# Patient Record
Sex: Female | Born: 1945
Health system: Southern US, Community
[De-identification: ages and names within clinical notes are randomized; demographics above are authoritative.]

## PROBLEM LIST (undated history)

## (undated) VITALS — BP 139/72 | HR 84 | Temp 98.3°F | Resp 20 | Ht 61.0 in | Wt 122.0 lb

## (undated) DIAGNOSIS — F319 Bipolar disorder, unspecified: Secondary | ICD-10-CM

## (undated) DIAGNOSIS — F329 Major depressive disorder, single episode, unspecified: Secondary | ICD-10-CM

## (undated) DIAGNOSIS — E890 Postprocedural hypothyroidism: Secondary | ICD-10-CM

## (undated) DIAGNOSIS — H269 Unspecified cataract: Secondary | ICD-10-CM

## (undated) DIAGNOSIS — Z860100 Personal history of colon polyps, unspecified: Secondary | ICD-10-CM

## (undated) DIAGNOSIS — E119 Type 2 diabetes mellitus without complications: Secondary | ICD-10-CM

## (undated) DIAGNOSIS — R350 Frequency of micturition: Secondary | ICD-10-CM

## (undated) DIAGNOSIS — Z87820 Personal history of traumatic brain injury: Secondary | ICD-10-CM

## (undated) DIAGNOSIS — R6883 Chills (without fever): Secondary | ICD-10-CM

## (undated) DIAGNOSIS — M810 Age-related osteoporosis without current pathological fracture: Secondary | ICD-10-CM

## (undated) DIAGNOSIS — E079 Disorder of thyroid, unspecified: Secondary | ICD-10-CM

## (undated) DIAGNOSIS — Z8639 Personal history of other endocrine, nutritional and metabolic disease: Secondary | ICD-10-CM

## (undated) DIAGNOSIS — E785 Hyperlipidemia, unspecified: Secondary | ICD-10-CM

## (undated) DIAGNOSIS — F32A Depression, unspecified: Secondary | ICD-10-CM

## (undated) HISTORY — DX: Depression, unspecified: F32.A

## (undated) HISTORY — DX: Disorder of thyroid, unspecified: E07.9

## (undated) HISTORY — DX: Hyperlipidemia, unspecified: E78.5

## (undated) HISTORY — DX: Type 2 diabetes mellitus without complications: E11.9

## (undated) HISTORY — DX: Major depressive disorder, single episode, unspecified: F32.9

## (undated) HISTORY — DX: Unspecified cataract: H26.9

## (undated) HISTORY — PX: OTHER SURGICAL HISTORY: SHX169

## (undated) HISTORY — DX: Age-related osteoporosis without current pathological fracture: M81.0

## (undated) HISTORY — PX: ABDOMINAL HYSTERECTOMY: SHX81

## (undated) HISTORY — PX: CHOLECYSTECTOMY: SHX55

---

## 1976-02-13 HISTORY — PX: TUBAL LIGATION: SHX77

## 1984-02-13 HISTORY — PX: VAGINAL HYSTERECTOMY: SUR661

## 1997-12-17 ENCOUNTER — Ambulatory Visit (HOSPITAL_COMMUNITY): Admission: RE | Admit: 1997-12-17 | Discharge: 1997-12-17 | Payer: Self-pay | Admitting: Gastroenterology

## 1999-01-19 ENCOUNTER — Other Ambulatory Visit: Admission: RE | Admit: 1999-01-19 | Discharge: 1999-01-19 | Payer: Self-pay | Admitting: Internal Medicine

## 2000-04-29 ENCOUNTER — Other Ambulatory Visit: Admission: RE | Admit: 2000-04-29 | Discharge: 2000-04-29 | Payer: Self-pay | Admitting: Internal Medicine

## 2002-06-17 ENCOUNTER — Encounter: Payer: Self-pay | Admitting: Internal Medicine

## 2002-06-17 ENCOUNTER — Encounter: Admission: RE | Admit: 2002-06-17 | Discharge: 2002-06-17 | Payer: Self-pay | Admitting: Internal Medicine

## 2004-09-01 ENCOUNTER — Ambulatory Visit (HOSPITAL_COMMUNITY): Admission: RE | Admit: 2004-09-01 | Discharge: 2004-09-01 | Payer: Self-pay | Admitting: Urology

## 2004-09-01 ENCOUNTER — Ambulatory Visit (HOSPITAL_BASED_OUTPATIENT_CLINIC_OR_DEPARTMENT_OTHER): Admission: RE | Admit: 2004-09-01 | Discharge: 2004-09-01 | Payer: Self-pay | Admitting: Urology

## 2004-09-01 HISTORY — PX: TRANSOBTURATOR SLING: SHX2571

## 2007-08-04 ENCOUNTER — Ambulatory Visit (HOSPITAL_COMMUNITY): Admission: RE | Admit: 2007-08-04 | Discharge: 2007-08-04 | Payer: Self-pay | Admitting: Gastroenterology

## 2007-08-04 ENCOUNTER — Encounter (INDEPENDENT_AMBULATORY_CARE_PROVIDER_SITE_OTHER): Payer: Self-pay | Admitting: Gastroenterology

## 2009-06-10 ENCOUNTER — Encounter: Admission: RE | Admit: 2009-06-10 | Discharge: 2009-06-10 | Payer: Self-pay | Admitting: Internal Medicine

## 2010-06-27 ENCOUNTER — Other Ambulatory Visit: Payer: Self-pay | Admitting: Internal Medicine

## 2010-06-27 DIAGNOSIS — Z1231 Encounter for screening mammogram for malignant neoplasm of breast: Secondary | ICD-10-CM

## 2010-06-27 NOTE — Op Note (Signed)
[name], [name]               ACCOUNT NO.:  [id number]   MEDICAL RECORD NO.:  [id number]          PATIENT TYPE:  AMB   LOCATION:  ENDO                         FACILITY:  [facility]   PHYSICIAN:  Anselmo Rod, M.D.  DATE OF BIRTH:  05-24-1945   DATE OF PROCEDURE:  08/04/2007  DATE OF DISCHARGE:                               OPERATIVE REPORT   PROCEDURE PERFORMED:  Colonoscopy with cold biopsies x 2 and snare  polypectomy times one.   ENDOSCOPIST:  Anselmo Rod, M.D.   INSTRUMENT USED:  Pentax video colonoscope.   INDICATIONS FOR PROCEDURE:  A 65 year old white female undergoing  screening colonoscopy to rule out colonic polyps, masses, etc.  The  patient has a family history of colon cancer in a maternal uncle who  died in his 41s of this malignancy.   PREPROCEDURE PREPARATION:  Informed consent was procured from the  patient. The patient fasted for 8 hours prior to the procedure and  prepped with a bottle of magnesium citrate and a gallon of TriLyte the  night prior to the procedure.  The risks and benefits of the procedure  including a 10% miss rate of cancer and polyp were discussed with the  patient as well.   PREPROCEDURE PHYSICAL:  VITAL SIGNS:  The patient had stable vital  signs.  NECK:  Supple.  CHEST:  Clear to auscultation.  S1 and S2 regular.  ABDOMEN:  Soft with normal bowel.   DESCRIPTION OF PROCEDURE:  The patient was placed in left lateral  decubitus position and sedated with 70 mcg of Fentanyl ond 6 mg of  Versed given intravenously in slow incremental doses. Once the patient  was adequately sedated and maintained on low-flow oxygen and continuous  cardiac monitoring the Pentax video colonoscope was advanced from the  rectum to the cecum.  The patient has significant amount of mucoid  debris in the colon.  Multiple washes were done. A small sessile polyp  was removed by cold biopsies x2 from the left colon. Another larger  sessile polyp was removed by  hot snare from 30 cm.  The transverse  colon, right colon and the cecum appeared normal.  The appendiceal  orifice and cecum were clearly visualized and photographed.  An isolated  diverticulum was noted in the distal left colon as well.  No other  masses or polyps were seen.  The patient tolerated the procedure well  without immediate complications.   IMPRESSION:  1. Two polyps noted in the left colon, one removed by hot snare and      one by cold biopsies x 2.  2. Isolated diverticulum in the left colon.  3. Normal-appearing transverse colon, right colon and cecum.  4. No abnormalities noted on retroflexion in the rectum.   RECOMMENDATIONS:  1. Await pathology results.  2. Repeat colonoscopy depending on pathology results.  3. Avoid all nonsteroidal's or aspirin for the next 2 weeks.  4. Outpatient follow-up as need arises in the future.  5. The importance of a high-fiber diet with liberal fluid intake has      been discussed  with the patient.      Anselmo Rod, M.D.  Electronically Signed     JNM/MEDQ  D:  08/04/2007  T:  08/04/2007  Job:  [id number]   cc:   Merlene Laughter. Renae Gloss, M.D.  Fax: [phone]

## 2010-06-30 NOTE — Op Note (Signed)
[name], [name]               ACCOUNT NO.:  [id number]   MEDICAL RECORD NO.:  [id number]          PATIENT TYPE:  AMB   LOCATION:  NESC                         FACILITY:  [facility]   PHYSICIAN:  Mark C. Vernie Ammons, M.D.  DATE OF BIRTH:  September 29, 1945   DATE OF PROCEDURE:  09/01/2004  DATE OF DISCHARGE:                                 OPERATIVE REPORT   PREOPERATIVE DIAGNOSIS:  Stress urinary incontinence.   POSTOPERATIVE DIAGNOSIS:  Stress urinary incontinence.   PROCEDURE:  Transobturator sling.   SURGEON:  Dr. Vernie Ammons   ANESTHESIA:  General.   SPECIMENS:  None.   BLOOD LOSS:  None.   DRAINS:  None.   COMPLICATIONS:  None.   INDICATIONS:  The patient is a 65 year old white female with pure stress  urinary incontinence by urodynamics and low leak point pressure.  This has  been present for some time and occurs with minimal provocation.  She has  elected to have surgical correction and understands the risks,  complications, alternatives, and limitations.   DESCRIPTION OF OPERATION:  After informed consent, the patient brought to  the major OR, placed on table, administered general anesthesia, then moved  to the dorsolithotomy position.  Her genitalia and vagina were then  sterilely prepped and draped, and a 16-French Foley catheter was placed in  the bladder.  Lidocaine 1% with epinephrine was then used to infiltrate the  subvaginal mucosa in the midline and the area of the mid portion of the  urethra.  I then allowed time for epinephrine effect.   While awaiting epinephrine effect, stab incisions were made 5cm from the  midline at the level of the clitoris after palpating the obturator fossa.  I  then redirected my attention to the vagina where a midline incision was then  made over the mid urethra.  I then used Struhle to deepen this on the right  and left sides lateral to the urethra.   With the bladder completely drained with a Foley catheter, the trocar was  then  passed through the skin incision, through the obturator fossa and back  behind the symphysis pubis and directed out at the mid urethral level with  digital guidance.  This was performed first on left then right side.  The  obturator sling material was then affixed to the trocars and brought through  the skin incisions.   Th catheter was removed, and the bladder was then inspected with the 70-  degree lens and 21-French cystoscopic sheath.  The bladder was noted to be  free of any tumor, stones, or inflammatory lesions.  Ureteral orifices  normal configuration and position with clear reflux, and there was no  evidence of perforation or injury to the bladder.  I therefore drained the  bladder and reinserted the Foley catheter.  I positioned the with the  appropriate tension, resulting in the tension-free placement and removed the  plastic sheath coating from the sling material.  There was no undue tension  noted, and I therefore irrigated the three incisions with antibiotic  irrigation and excised the redundant sling material at the skin  level.  I  closed the skin incisions with Dermabond.  I then closed the vaginal  incision with a running 2-0 Vicryl suture after again irrigating copiously  with antibiotic solution.  The bladder was then drained, and the catheter  was removed.  The patient was awakened and taken to recovery in stable,  satisfactory condition.  She tolerated the procedure well with no  intraoperative complications, and needle, sponge, and instruments counts  were correct at the end of the operation.   She will be given a prescription for Vicodin HP #36 and remain on Keflex 500  mg b.i.d. for 5 days.  She will return to my office in 7 days for recheck.       MCO/MEDQ  D:  09/01/2004  T:  09/01/2004  Job:  [id number]

## 2010-07-26 ENCOUNTER — Ambulatory Visit
Admission: RE | Admit: 2010-07-26 | Discharge: 2010-07-26 | Disposition: A | Discharge status: Home / Self Care | Source: Ambulatory Visit | Attending: Internal Medicine | Admitting: Internal Medicine

## 2010-07-26 DIAGNOSIS — Z1231 Encounter for screening mammogram for malignant neoplasm of breast: Secondary | ICD-10-CM

## 2011-06-18 ENCOUNTER — Other Ambulatory Visit: Payer: Self-pay | Admitting: Internal Medicine

## 2011-06-18 DIAGNOSIS — Z1231 Encounter for screening mammogram for malignant neoplasm of breast: Secondary | ICD-10-CM

## 2011-07-27 ENCOUNTER — Ambulatory Visit
Admission: RE | Admit: 2011-07-27 | Discharge: 2011-07-27 | Disposition: A | Discharge status: Home / Self Care | Source: Ambulatory Visit | Attending: Internal Medicine | Admitting: Internal Medicine

## 2011-07-27 DIAGNOSIS — Z1231 Encounter for screening mammogram for malignant neoplasm of breast: Secondary | ICD-10-CM

## 2012-01-29 ENCOUNTER — Encounter (HOSPITAL_COMMUNITY): Payer: Self-pay | Admitting: Behavioral Health

## 2012-01-29 ENCOUNTER — Inpatient Hospital Stay (HOSPITAL_COMMUNITY)
Admission: RE | Admit: 2012-01-29 | Discharge: 2012-02-10 | DRG: 885 | Disposition: A | Discharge status: Home / Self Care | Attending: Emergency Medicine | Admitting: Emergency Medicine

## 2012-01-29 DIAGNOSIS — IMO0002 Reserved for concepts with insufficient information to code with codable children: Secondary | ICD-10-CM

## 2012-01-29 DIAGNOSIS — R51 Headache: Secondary | ICD-10-CM | POA: Diagnosis not present

## 2012-01-29 DIAGNOSIS — S0180XA Unspecified open wound of other part of head, initial encounter: Secondary | ICD-10-CM | POA: Diagnosis not present

## 2012-01-29 DIAGNOSIS — Z87891 Personal history of nicotine dependence: Secondary | ICD-10-CM

## 2012-01-29 DIAGNOSIS — Y929 Unspecified place or not applicable: Secondary | ICD-10-CM

## 2012-01-29 DIAGNOSIS — Y939 Activity, unspecified: Secondary | ICD-10-CM

## 2012-01-29 DIAGNOSIS — R42 Dizziness and giddiness: Secondary | ICD-10-CM | POA: Diagnosis not present

## 2012-01-29 DIAGNOSIS — I951 Orthostatic hypotension: Secondary | ICD-10-CM | POA: Diagnosis not present

## 2012-01-29 DIAGNOSIS — R110 Nausea: Secondary | ICD-10-CM | POA: Diagnosis not present

## 2012-01-29 DIAGNOSIS — W19XXXA Unspecified fall, initial encounter: Secondary | ICD-10-CM | POA: Diagnosis not present

## 2012-01-29 DIAGNOSIS — F312 Bipolar disorder, current episode manic severe with psychotic features: Secondary | ICD-10-CM

## 2012-01-29 DIAGNOSIS — F3112 Bipolar disorder, current episode manic without psychotic features, moderate: Secondary | ICD-10-CM | POA: Diagnosis present

## 2012-01-29 LAB — URINALYSIS, ROUTINE W REFLEX MICROSCOPIC
Glucose, UA: NEGATIVE mg/dL
Leukocytes, UA: NEGATIVE
Protein, ur: NEGATIVE mg/dL
Specific Gravity, Urine: 1.013 (ref 1.005–1.030)
Urobilinogen, UA: 1 mg/dL (ref 0.0–1.0)

## 2012-01-29 LAB — CBC WITH DIFFERENTIAL/PLATELET
Basophils Relative: 0 % (ref 0–1)
Eosinophils Absolute: 0.2 10*3/uL (ref 0.0–0.7)
HCT: 39.1 % (ref 36.0–46.0)
Hemoglobin: 12.5 g/dL (ref 12.0–15.0)
MCH: 31.2 pg (ref 26.0–34.0)
MCHC: 32 g/dL (ref 30.0–36.0)
Monocytes Absolute: 0.6 10*3/uL (ref 0.1–1.0)
Monocytes Relative: 6 % (ref 3–12)
Neutro Abs: 8.3 10*3/uL — ABNORMAL HIGH (ref 1.7–7.7)

## 2012-01-29 LAB — COMPREHENSIVE METABOLIC PANEL
Alkaline Phosphatase: 71 U/L (ref 39–117)
BUN: 11 mg/dL (ref 6–23)
Chloride: 107 mEq/L (ref 96–112)
GFR calc Af Amer: 40 mL/min — ABNORMAL LOW (ref >90)
GFR calc non Af Amer: 35 mL/min — ABNORMAL LOW (ref >90)
Glucose, Bld: 131 mg/dL — ABNORMAL HIGH (ref 70–99)
Potassium: 3.2 mEq/L — ABNORMAL LOW (ref 3.5–5.1)
Total Bilirubin: 0.7 mg/dL (ref 0.3–1.2)

## 2012-01-29 LAB — ETHANOL: Alcohol, Ethyl (B): <11 mg/dL (ref 0–11)

## 2012-01-29 LAB — RAPID URINE DRUG SCREEN, HOSP PERFORMED: Opiates: NOT DETECTED

## 2012-01-29 MED ORDER — ALUM & MAG HYDROXIDE-SIMETH 200-200-20 MG/5ML PO SUSP: 30.0000 mL | ORAL | Status: DC | PRN

## 2012-01-29 MED ORDER — MAGNESIUM HYDROXIDE 400 MG/5ML PO SUSP
30.0000 mL | Freq: Every day | ORAL | Status: DC | PRN
  Administered 2012-02-06: 30 mL via ORAL

## 2012-01-29 MED ORDER — ACETAMINOPHEN 325 MG PO TABS: 650.0000 mg | ORAL_TABLET | Freq: Four times a day (QID) | ORAL | Status: DC | PRN

## 2012-01-29 NOTE — Progress Notes (Signed)
Psychoeducational Group Note  Date:  01/29/2012 Time:  2000  Group Topic/Focus:  Wrap-Up Group:   The focus of this group is to help patients review their daily goal of treatment and discuss progress on daily workbooks.  Participation Level:  Minimal  Participation Quality:  Appropriate  Affect:  Appropriate and Excited  Cognitive:  Disorganized, Confused, Delusional and Hallucinating  Insight:  Distracting  Engagement in Group:  Off Topic  Additional Comments:  Pt attended wrap-up group this evening. Pt was off topic in group but did stated that "her father cheated on her and would like to forgive him". Pt had to be redirected multiply times while in group. Pt was off topic most of group.   Chonda Baney A 01/29/2012, 10:08 PM
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
## 2012-01-29 NOTE — Progress Notes (Addendum)
D: Patient in dayroom on approach.  Patient disorganized and tangential.  Patient appears confused and cannot stay on topic.  Patient speak about there husband but then states naming women's names and speaking about Drexel Iha and calling the [employer].  Patient oriented to place and date but patient continues to speak and talks about her neighbors. Writer attempted to get an EKG on patient but patient was not understanding.  Will retry EKG in the AM. A: Staff to monitor Q 15 mins for safety.  Encouragement and support offered.  No medications administered tonight. R: Patient remains safe on the unit.  Patient attended group tonight.  Patient pleasantly psychotic.

## 2012-01-29 NOTE — Tx Team (Signed)
Initial Interdisciplinary Treatment Plan  PATIENT STRENGTHS: (choose at least two) Motivation for treatment/growth  PATIENT STRESSORS: Medication change or noncompliance   PROBLEM LIST: Problem List/Patient Goals Date to be addressed Date deferred Reason deferred Estimated date of resolution   Psychosis  01/29/12     Disorientation  01/29/12                                                DISCHARGE CRITERIA:  Ability to meet basic life and health needs Improved stabilization in mood, thinking, and/or behavior Verbal commitment to aftercare and medication compliance  PRELIMINARY DISCHARGE PLAN: Attend aftercare/continuing care group Attend PHP/IOP Outpatient therapy  PATIENT/FAMIILY INVOLVEMENT: This treatment plan has been presented to and reviewed with the patient, [name], and/or family member. The patient and family have been given the opportunity to ask questions and make suggestions.  [name] 01/29/2012, 7:03 PM

## 2012-01-30 ENCOUNTER — Ambulatory Visit (HOSPITAL_COMMUNITY)
Admit: 2012-01-30 | Discharge: 2012-01-30 | Disposition: A | Discharge status: Home / Self Care | Attending: Physician Assistant | Admitting: Physician Assistant

## 2012-01-30 DIAGNOSIS — J3489 Other specified disorders of nose and nasal sinuses: Secondary | ICD-10-CM | POA: Insufficient documentation

## 2012-01-30 DIAGNOSIS — R4182 Altered mental status, unspecified: Secondary | ICD-10-CM | POA: Insufficient documentation

## 2012-01-30 DIAGNOSIS — F29 Unspecified psychosis not due to a substance or known physiological condition: Secondary | ICD-10-CM | POA: Insufficient documentation

## 2012-01-30 MED ORDER — IOHEXOL 350 MG/ML SOLN
80.0000 mL | Freq: Once | INTRAVENOUS | Status: AC | PRN
  Administered 2012-01-30: 80 mL via INTRAVENOUS

## 2012-01-30 MED ORDER — POTASSIUM CHLORIDE CRYS ER 20 MEQ PO TBCR
20.0000 meq | EXTENDED_RELEASE_TABLET | Freq: Every day | ORAL | Status: AC
  Administered 2012-01-30 – 2012-02-04 (×6): 20 meq via ORAL
  Filled 2012-01-30 (×8): qty 1

## 2012-01-30 NOTE — Progress Notes (Signed)
[facility] LCSW Aftercare Discharge Planning Group Note  01/30/2012 3:27 PM  Participation Quality:  Did not attend   Ida Rogue 01/30/2012, 3:27 PM

## 2012-01-30 NOTE — Progress Notes (Signed)
[facility] Group Notes:  (Counselor/Nursing/MHT/Case Management/Adjunct)  01/30/2012 4:39 PM  Type of Therapy:  [employer]  Participation Level:  Active  Participation Quality:  Redirectable  Affect:  Appropriate  Cognitive:  Confused  Insight:  Engaged  Engagement in Group:  Engaged  Engagement in Therapy:  Engaged  Modes of Intervention:  Activity and Education  Summary of Progress/Problems: Pt. Was attentive, but at times became confused, shouting at another pt. And laughing inappropriately.    Ruta Hinds [facility] 01/30/2012, 4:39 PM

## 2012-01-30 NOTE — Progress Notes (Signed)
Psychoeducational Group Note  Date:  01/30/2012 Time:  1100  Group Topic/Focus:  Healthy Communication:   The focus of this group is to discuss communication, barriers to communication, as well as healthy ways to communicate with others.  Participation Level:  Minimal  Participation Quality:  Intrusive  Affect:  Not Congruent  Cognitive:  Confused  Insight:  Off Topic  Engagement in Group:  Off Topic  Additional Comments:  Pt was not at all on topic with the posed questions during the group  Chantella Creech A 01/30/2012, 5:01 PM

## 2012-01-30 NOTE — Progress Notes (Signed)
4098: Patient came into the hallway crying loudly and laughing at the same time.  Patient was trying to hug staff and said she was crying because Dustin MHT told her that she could not hug him.  Patient was told to go to her room and patient agreed.  Patient ran down the hallway to her room crying loudly and then when writer accompanied her to her room patient continued to laugh and cry.  Patient started talking about her mother and began to laugh hysterically.  Patient states her mother never told her she was her mother.  Patient stated she did not care if she woke everybody up they can just have her jacket.  Patient cooperative and did go lay in bed. Will continue to monitor.

## 2012-01-30 NOTE — [facility] Suicide Risk Assessment (Signed)
Suicide Risk Assessment  Admission Assessment     Nursing information obtained from:  Patient Demographic factors:  Caucasian Current Mental Status:  NA Loss Factors:  NA Historical Factors:  Impulsivity Risk Reduction Factors:  Living with another person, especially a relative  CLINICAL FACTORS:   Bipolar Disorder:   Mixed State  COGNITIVE FEATURES THAT CONTRIBUTE TO RISK:  Closed-mindedness Polarized thinking Thought constriction (tunnel vision)    SUICIDE RISK:   Minimal: No identifiable suicidal ideation.  Patients presenting with no risk factors but with morbid ruminations; may be classified as minimal risk based on the severity of the depressive symptoms  PLAN OF CARE:1. Admit for crisis management and stabilization. 2. Medication management to reduce current symptoms to base line and improve the patient's overall level of functioning 3. Treat health problems as indicated. 4. Develop treatment plan to decrease risk of relapse upon discharge and the need for readmission. 5. Psycho-social education regarding relapse prevention and self care. 6. Health care follow up as needed for medical problems. 7. Restart home medications where appropriate.     Sabri Teal,MD 01/30/2012, 9:52 AM

## 2012-01-30 NOTE — Progress Notes (Signed)
The focus of this group is to help patients review their daily goal of treatment and discuss progress on daily workbooks. Pt was extremely confused throughout group and required frequent redirection to keep her from touching other Pt's, interrupting, standing up and knocking on the wall. All attempts at redirection were met with very short lived responses. Pt was asked three prompts by the Writer, none of which she was able to respond with an appropriate, on-topic answer to.

## 2012-01-30 NOTE — Progress Notes (Signed)
D: Patient denies SI/HI and auditory and visual hallucinations. The patient has an anxious mood and affect. The patient reports sleeping well and states that her appetite and energy level are normal. The patient is disorganized and has moderate confusion. The patient displays bizarre behavior at times, switching moods quickly/abruptly (starting crying and ends laughing). The patient is interacting within the milieu but requires redirection for her behavior.  A: Patient given emotional support from RN. Patient encouraged to come to staff with concerns and/or questions. Patient's medication routine continued. Patient's orders and plan of care reviewed.  R: Patient remains cooperative. Will continue to monitor patient q15 minutes for safety.
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
## 2012-01-30 NOTE — Treatment Plan (Signed)
Interdisciplinary Treatment Plan Update (Adult)  Date: 01/30/2012  Time Reviewed: 8:11 AM   Progress in Treatment: Attending groups: No Participating in groups: No Taking medication as prescribed: Yes Tolerating medication: Yes   Family/Significant other contact made:  No Patient understands diagnosis:  No Currently no insight Discussing patient identified problems/goals with staff:  No Medical problems stabilized or resolved:  Yes Denies suicidal/homicidal ideation: Yes  In tx team Issues/concerns per patient self-inventory:  Not filled out Other:  New problem(s) identified: N/A  Reason for Continuation of Hospitalization: Mania Medication stabilization  Interventions implemented related to continuation of hospitalization:  Mood stabilizer, antipsychotic trial   Encourage group attendance and participation    Additional comments:  Estimated length of stay:  4-5 days  Discharge Plan: return home, follow up outp  New goal(s): N/A  Review of initial/current patient goals per problem list:   1.  Goal(s): Stabilize mood with the help of medication, therapeutic milieu  Met:  No  Target date:12/23  As evidenced ZO:[id number] will return to her baseline, demonstrating decreased mood lability and ability to care for self   2.  Goal (s):Eliminate psychosis with the help of medication, therapeutic milieu  Met:  No  Target date:12/23  As evidenced by: Porchia will no longer be responding to internal stimuli  3.  Goal(s):  Met:  No  Target date:  As evidenced by:  4.  Goal(s):  Met:  No  Target date:  As evidenced by:  Attendees: Patient:     Family:     Physician:  Thedore Mins 01/30/2012 8:11 AM   Nursing:  Nestor Ramp  01/30/2012 8:11 AM   Clinical Social Worker:  Richelle Ito 01/30/2012 8:11 AM   Extender:  Verne Spurr PA 01/30/2012 8:11 AM   Other:     Other:     Other:     Other:      Scribe for Treatment Team:   Ida Rogue, 01/30/2012  8:11 AM
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
## 2012-01-31 MED ORDER — LURASIDONE HCL 40 MG PO TABS
40.0000 mg | ORAL_TABLET | Freq: Every day | ORAL | Status: DC
  Administered 2012-01-31 – 2012-02-01 (×2): 40 mg via ORAL
  Filled 2012-01-31 (×3): qty 1

## 2012-01-31 MED ORDER — OXCARBAZEPINE 150 MG PO TABS
150.0000 mg | ORAL_TABLET | Freq: Two times a day (BID) | ORAL | Status: DC
  Administered 2012-01-31 – 2012-02-01 (×3): 150 mg via ORAL
  Filled 2012-01-31 (×5): qty 1

## 2012-01-31 MED ORDER — TRAZODONE HCL 50 MG PO TABS
50.0000 mg | ORAL_TABLET | Freq: Every evening | ORAL | Status: DC | PRN
  Administered 2012-01-31 – 2012-02-08 (×7): 50 mg via ORAL
  Filled 2012-01-31 (×8): qty 1

## 2012-01-31 NOTE — Progress Notes (Signed)
Patient has been up and down the whole night singing, laughing loud, and crying in the hallway.  Patient is redirectable but has to be redirected several times.  Patient is in her room singing and she is folding and unfolding her clothes several times and making up her bed several times.  Patient is disrupting the milieu and several patients are complaining about her being loud.

## 2012-01-31 NOTE — Progress Notes (Signed)
[facility] MD Progress Note  01/31/2012 11:08 AM [name]  MRN:  [id number]  Subjective: Patient is euphoric, giggling, with disorganized thoughts, with flight of ideas and talking to herself.  Diagnosis: Bipolar I Disorder most recent episode manic severe with psychosis  ADL's:  Impaired  Sleep: Poor  Appetite:  Fair  Suicidal Ideation: denies Plan:  denies Intent:  denies Means:  denies Homicidal Ideation:  Plan:  denies Intent:  denies Means:  denies AEB (as evidenced by):  Psychiatric Specialty Exam: Review of Systems  Constitutional: Negative.   HENT: Negative.   Eyes: Negative.   Respiratory: Negative.   Cardiovascular: Negative.   Gastrointestinal: Negative.   Genitourinary: Negative.   Musculoskeletal: Negative.   Skin: Negative.   Neurological: Negative.   Endo/Heme/Allergies: Negative.   Psychiatric/Behavioral: Positive for hallucinations. The patient has insomnia.        Euphoric mood.    Blood pressure 133/80, pulse 85, temperature 97.8 F (36.6 C), temperature source Oral, resp. rate 18.There is no height or weight on file to calculate BMI.  General Appearance: Casual and Disheveled  Eye Contact::  Good  Speech:  Garbled and Pressured  Volume:  Increased  Mood:  Euphoric  Affect:  Full Range  Thought Process:  Circumstantial, Disorganized and Loose  Orientation:  Other:  to place only  Thought Content:  Delusions  Suicidal Thoughts:  No  Homicidal Thoughts:  No  Memory:  unable to assess  Judgement:  Poor  Insight:  Lacking  Psychomotor Activity:  Increased  Concentration:  Poor  Recall:  Poor  Akathisia:  No  Handed:  Right  AIMS (if indicated):     Assets:  Social Support  Sleep:  Number of Hours: 1.25    Current Medications: Current Facility-Administered Medications  Medication Dose Route Frequency Provider Last Rate Last Dose  . acetaminophen (TYLENOL) tablet 650 mg  650 mg Oral Q6H PRN Verne Spurr, PA-C      . alum & mag  hydroxide-simeth (MAALOX/MYLANTA) 200-200-20 MG/5ML suspension 30 mL  30 mL Oral Q4H PRN Verne Spurr, PA-C      . lurasidone (LATUDA) tablet 40 mg  40 mg Oral Q breakfast Verne Spurr, PA-C   40 mg at 01/31/12 1006  . magnesium hydroxide (MILK OF MAGNESIA) suspension 30 mL  30 mL Oral Daily PRN Verne Spurr, PA-C      . OXcarbazepine (TRILEPTAL) tablet 150 mg  150 mg Oral BID Verne Spurr, PA-C   150 mg at 01/31/12 1006  . potassium chloride SA (K-DUR,KLOR-CON) CR tablet 20 mEq  20 mEq Oral Daily Verne Spurr, PA-C   20 mEq at 01/31/12 0741  . traZODone (DESYREL) tablet 50 mg  50 mg Oral QHS PRN,MR X 1 Kerry Hough, PA   50 mg at 01/31/12 0154    Lab Results:  Results for orders placed during the hospital encounter of 01/29/12 (from the past 48 hour(s))  URINALYSIS, ROUTINE W REFLEX MICROSCOPIC     Status: Abnormal   Collection Time   01/29/12  5:29 PM      Component Value Range Comment   Color, Urine YELLOW  YELLOW    APPearance CLOUDY (*) CLEAR    Specific Gravity, Urine 1.013  1.005 - 1.030    pH 6.5  5.0 - 8.0    Glucose, UA NEGATIVE  NEGATIVE mg/dL    Hgb urine dipstick NEGATIVE  NEGATIVE    Bilirubin Urine NEGATIVE  NEGATIVE    Ketones, ur NEGATIVE  NEGATIVE  mg/dL    Protein, ur NEGATIVE  NEGATIVE mg/dL    Urobilinogen, UA 1.0  0.0 - 1.0 mg/dL    Nitrite NEGATIVE  NEGATIVE    Leukocytes, UA NEGATIVE  NEGATIVE MICROSCOPIC NOT DONE ON URINES WITH NEGATIVE PROTEIN, BLOOD, LEUKOCYTES, NITRITE, OR GLUCOSE <1000 mg/dL.  URINE RAPID DRUG SCREEN (HOSP PERFORMED)     Status: Normal   Collection Time   01/29/12  5:29 PM      Component Value Range Comment   Opiates NONE DETECTED  NONE DETECTED    Cocaine NONE DETECTED  NONE DETECTED    Benzodiazepines NONE DETECTED  NONE DETECTED    Amphetamines NONE DETECTED  NONE DETECTED    Tetrahydrocannabinol NONE DETECTED  NONE DETECTED    Barbiturates NONE DETECTED  NONE DETECTED   COMPREHENSIVE METABOLIC PANEL     Status: Abnormal    Collection Time   01/29/12  7:48 PM      Component Value Range Comment   Sodium 140  135 - 145 mEq/L    Potassium 3.2 (*) 3.5 - 5.1 mEq/L    Chloride 107  96 - 112 mEq/L    CO2 24  19 - 32 mEq/L    Glucose, Bld 131 (*) 70 - 99 mg/dL    BUN 11  6 - 23 mg/dL    Creatinine, Ser 4.09 (*) 0.50 - 1.10 mg/dL    Calcium 81.1 (*) 8.4 - 10.5 mg/dL    Total Protein 7.9  6.0 - 8.3 g/dL    Albumin 3.9  3.5 - 5.2 g/dL    AST 54 (*) 0 - 37 U/L    ALT 26  0 - 35 U/L    Alkaline Phosphatase 71  39 - 117 U/L    Total Bilirubin 0.7  0.3 - 1.2 mg/dL    GFR calc non Af Amer 35 (*) >90 mL/min    GFR calc Af Amer 40 (*) >90 mL/min   ETHANOL     Status: Normal   Collection Time   01/29/12  7:48 PM      Component Value Range Comment   Alcohol, Ethyl (B) <11  0 - 11 mg/dL   HEMOGLOBIN B1Y     Status: Abnormal   Collection Time   01/29/12  7:48 PM      Component Value Range Comment   Hemoglobin A1C 6.1 (*) <5.7 %    Mean Plasma Glucose 128 (*) <117 mg/dL   LITHIUM LEVEL     Status: Normal   Collection Time   01/29/12  7:48 PM      Component Value Range Comment   Lithium Lvl 1.18  0.80 - 1.40 mEq/L   TSH     Status: Abnormal   Collection Time   01/29/12  7:48 PM      Component Value Range Comment   TSH 4.670 (*) 0.350 - 4.500 uIU/mL   CBC WITH DIFFERENTIAL     Status: Abnormal   Collection Time   01/29/12  7:48 PM      Component Value Range Comment   WBC 10.7 (*) 4.0 - 10.5 K/uL    RBC 4.01  3.87 - 5.11 MIL/uL    Hemoglobin 12.5  12.0 - 15.0 g/dL    HCT 78.2  95.6 - 21.3 %    MCV 97.5  78.0 - 100.0 fL    MCH 31.2  26.0 - 34.0 pg    MCHC 32.0  30.0 - 36.0 g/dL    RDW 12.9  11.5 - 15.5 %    Platelets 296  150 - 400 K/uL    Neutrophils Relative 78 (*) 43 - 77 %    Neutro Abs 8.3 (*) 1.7 - 7.7 K/uL    Lymphocytes Relative 14  12 - 46 %    Lymphs Abs 1.5  0.7 - 4.0 K/uL    Monocytes Relative 6  3 - 12 %    Monocytes Absolute 0.6  0.1 - 1.0 K/uL    Eosinophils Relative 2  0 - 5 %     Eosinophils Absolute 0.2  0.0 - 0.7 K/uL    Basophils Relative 0  0 - 1 %    Basophils Absolute 0.0  0.0 - 0.1 K/uL     Physical Findings: AIMS: Facial and Oral Movements Muscles of Facial Expression: None, normal Lips and Perioral Area: None, normal Jaw: None, normal Tongue: None, normal,Extremity Movements Upper (arms, wrists, hands, fingers): None, normal Lower (legs, knees, ankles, toes): None, normal, Trunk Movements Neck, shoulders, hips: None, normal, Overall Severity Severity of abnormal movements (highest score from questions above): None, normal Patient's awareness of abnormal movements (rate only patient's report): No Awareness, Dental Status Current problems with teeth and/or dentures?: No Does patient usually wear dentures?: No  CIWA:    COWS:     Treatment Plan Summary: Daily contact with patient to assess and evaluate symptoms and progress in treatment Medication management  Plan: 1. Initiate Latuda 40mg  daily with supper for mood stabilization and psychosis 2. Initiate Trileptal 150mg  twice daily for mood stabilization.  Medical Decision Making Problem Points:  Established problem, worsening (2), Review of last therapy session (1), Review of psycho-social stressors (1) and Self-limited or minor (1) Data Points:  Order Aims Assessment (2) Review of medication regiment & side effects (2) Review of new medications or change in dosage (2)  I certify that inpatient services furnished can reasonably be expected to improve the patient's condition.   Thurman Sarver,MD 01/31/2012, 11:08 AM
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
## 2012-01-31 NOTE — Progress Notes (Signed)
D: Patient denies SI/HI and auditory and visual hallucinations. The patient has an anxious mood and affect. The patient reports sleeping well and states that her appetite and energy level are normal. The patient is disorganized and has moderate confusion. The patient displays bizarre behavior at times, switching moods quickly/abruptly (starting crying and ends laughing). The patient is interacting within the milieu but requires frequent redirection for her behavior. The patient states that she is "having fun" and that she "is enjoying her stay here."  A: Patient given emotional support from RN. Patient encouraged to come to staff with concerns and/or questions. Patient's medication routine continued. Patient's orders and plan of care reviewed.   R: Patient remains cooperative. Will continue to monitor patient q15 minutes for safety.

## 2012-01-31 NOTE — Progress Notes (Signed)
[facility] Group Notes:  (Counselor/Nursing/MHT/Case Management/Adjunct)  01/31/2012 10:19 AM  Type of Therapy:  Discharge Planning  Participation Level:  Active  Participation Quality:  Monopolizing  Affect:  Labile  Cognitive:  Disorganized and Confused  Insight:  Lacking  Engagement in Group:  Off Topic  Engagement in Therapy:  None  Modes of Intervention:  Limit-setting and redirection  Summary of Progress/Problems: [name] was unable to participate in group discussion.   Clide Dales 01/31/2012, 10:19 AM

## 2012-01-31 NOTE — Progress Notes (Signed)
Psychoeducational Group Note  Date:  01/31/2012 Time:  2000  Group Topic/Focus:  Karaoke  Participation Level: Did Not Attend  Participation Quality:  Not Applicable  Affect:  Not Applicable  Cognitive:  Not Applicable  Insight:  Not Applicable  Engagement in Group: Not Applicable  Additional Comments:    Flonnie Hailstone 01/31/2012, 11:33 PM

## 2012-02-01 ENCOUNTER — Encounter (HOSPITAL_COMMUNITY): Payer: Self-pay | Admitting: Psychiatry

## 2012-02-01 MED ORDER — LEVOTHYROXINE SODIUM 125 MCG PO TABS
125.0000 ug | ORAL_TABLET | Freq: Every day | ORAL | Status: DC
  Administered 2012-02-01 – 2012-02-09 (×9): 125 ug via ORAL
  Filled 2012-02-01 (×15): qty 1

## 2012-02-01 MED ORDER — LURASIDONE HCL 80 MG PO TABS
80.0000 mg | ORAL_TABLET | Freq: Every day | ORAL | Status: DC
  Administered 2012-02-01 – 2012-02-09 (×9): 80 mg via ORAL
  Filled 2012-02-01 (×13): qty 1

## 2012-02-01 MED ORDER — OXCARBAZEPINE 300 MG PO TABS
300.0000 mg | ORAL_TABLET | Freq: Two times a day (BID) | ORAL | Status: DC
  Administered 2012-02-01 – 2012-02-04 (×6): 300 mg via ORAL
  Filled 2012-02-01 (×8): qty 1

## 2012-02-01 NOTE — Progress Notes (Signed)
[facility] Group Notes:  (Counselor/Nursing/MHT/Case Management/Adjunct)  02/01/2012 2:34 PM  Type of Therapy:  Group Therapy  Participation Level:  Active  Participation Quality:  Intrusive  Affect:  Labile  Cognitive:  Disorganized  Insight:  None  Engagement in Group:  Lacking  Engagement in Therapy:  Off Topic  Modes of Intervention:  Limit-setting and support.  Summary of Progress/Problems: Patient focused on offering other patients support but was open to redirection when attempts made to hug other patients. [name] shared that relatives came to visit last PM and shared they were expecting a child.  [name] was very excited re the pregnancy.    Clide Dales 02/01/2012, 2:34 PM

## 2012-02-01 NOTE — Progress Notes (Signed)
Patient ID: [name], female   DOB: [birth year], 66 y.o.   MRN: [id number] [facility] MD Progress Note  02/01/2012 9:29 AM [name]  MRN:  [id number]  Subjective: Patient has difficulty sleeping, disorganized thought process, euphoric mood, giggling,  with flight of ideas and talking to herself. She reports that she hears voices but unable to elaborate on it.   Diagnosis: Bipolar I Disorder most recent episode manic severe with psychosis  ADL's:  Impaired  Sleep: Poor  Appetite:  Fair  Suicidal Ideation: denies Plan:  denies Intent:  denies Means:  denies Homicidal Ideation:  Plan:  denies Intent:  denies Means:  denies AEB (as evidenced by):  Psychiatric Specialty Exam: Review of Systems  Constitutional: Negative.   HENT: Negative.   Eyes: Negative.   Respiratory: Negative.   Cardiovascular: Negative.   Gastrointestinal: Negative.   Genitourinary: Negative.   Musculoskeletal: Negative.   Skin: Negative.   Neurological: Negative.   Endo/Heme/Allergies: Negative.   Psychiatric/Behavioral: Positive for hallucinations. The patient has insomnia.        Euphoric mood.    Blood pressure 98/60, pulse 91, temperature 97.8 F (36.6 C), temperature source Oral, resp. rate 16, height 5\' 1"  (1.549 m), weight 55.339 kg (122 lb).Body mass index is 23.05 kg/(m^2).  General Appearance: Casual and Disheveled  Eye Contact::  Good  Speech:  Garbled and Pressured  Volume:  Increased  Mood:  Euphoric  Affect:  Full Range  Thought Process:  Circumstantial, Disorganized and Loose  Orientation:  To place and person not to time  Thought Content:  Delusions  Suicidal Thoughts:  No  Homicidal Thoughts:  No  Memory:  poor  Judgement:  Poor  Insight:  Lacking  Psychomotor Activity:  Increased  Concentration:  Poor  Recall:  Poor  Akathisia:  No  Handed:  Right  AIMS (if indicated):     Assets:  Social Support  Sleep:  Number of Hours: 1    Current Medications: Current  Facility-Administered Medications  Medication Dose Route Frequency Provider Last Rate Last Dose  . acetaminophen (TYLENOL) tablet 650 mg  650 mg Oral Q6H PRN Verne Spurr, PA-C      . alum & mag hydroxide-simeth (MAALOX/MYLANTA) 200-200-20 MG/5ML suspension 30 mL  30 mL Oral Q4H PRN Verne Spurr, PA-C      . lurasidone (LATUDA) tablet 80 mg  80 mg Oral Q supper Tonianne Fine      . magnesium hydroxide (MILK OF MAGNESIA) suspension 30 mL  30 mL Oral Daily PRN Verne Spurr, PA-C      . Oxcarbazepine (TRILEPTAL) tablet 300 mg  300 mg Oral BID Toure Edmonds      . potassium chloride SA (K-DUR,KLOR-CON) CR tablet 20 mEq  20 mEq Oral Daily Verne Spurr, PA-C   20 mEq at 02/01/12 0743  . traZODone (DESYREL) tablet 50 mg  50 mg Oral QHS PRN,MR X 1 Kerry Hough, PA   50 mg at 02/01/12 7829    Lab Results:  No results found for this or any previous visit (from the past 48 hour(s)).  Physical Findings: AIMS: Facial and Oral Movements Muscles of Facial Expression: None, normal Lips and Perioral Area: None, normal Jaw: None, normal Tongue: None, normal,Extremity Movements Upper (arms, wrists, hands, fingers): None, normal Lower (legs, knees, ankles, toes): None, normal, Trunk Movements Neck, shoulders, hips: None, normal, Overall Severity Severity of abnormal movements (highest score from questions above): None, normal Patient's awareness of abnormal movements (rate only patient's report):  No Awareness, Dental Status Current problems with teeth and/or dentures?: No Does patient usually wear dentures?: No  CIWA:    COWS:     Treatment Plan Summary: Daily contact with patient to assess and evaluate symptoms and progress in treatment Medication management  Plan: 1. Increase  Latuda to 80mg  daily with supper for mood stabilization and psychosis 2. Increase  Trileptal to 300mg  twice daily for mood stabilization.  Medical Decision Making Problem Points:  Established problem, worsening  (2), Review of last therapy session (1), Review of psycho-social stressors (1) and Self-limited or minor (1) Data Points:  Order Aims Assessment (2) Review of medication regiment & side effects (2) Review of new medications or change in dosage (2)  I certify that inpatient services furnished can reasonably be expected to improve the patient's condition.   Maisy Newport,MD 02/01/2012, 9:29 AM

## 2012-02-01 NOTE — Progress Notes (Signed)
D:Pt is confused and tangential going from one subject to another. Pt's mood is pleasant and she is easily redirectable.  A:Supported pt to discuss feelings. Offered support and 15 minute checks. Gave medications as ordered. R:Pt is tolerating medications well. She denies si and hi. Safety maintained on the unit.

## 2012-02-01 NOTE — H&P (Signed)
Seen and agreed. Lethia Donlon, MD 

## 2012-02-01 NOTE — Progress Notes (Signed)
Patient ID: ABREY BRADWAY, female   DOB: 22-Jul-1945, 66 y.o.   MRN: [id number]  CSW spoke with patient's husband Jorja Loa, (given name Iantha Fallen) at [phone] who provided patient's history of mental health issues with first episode he is aware of occurring in 1986 after which she was prescribed Lithium until November 7 when she was taken off of Lithium due to stomach issues.  Tim, husband of 31 years, has noticed increased changes in patient since Lithium was abruptly stopped.  Tim and [employer] and daughter in law Jasmine December visited last night and noticed patient was able to answer direct questions more correctly when questioned directly and prompted but within a few moments returned to disorganized thoughts.  Family is aware of need for patient to remain hospitalized in order to stabilize which may be through the upcoming holiday.   Carney Bern, LCSWA Clinical Social Worker [phone]

## 2012-02-01 NOTE — Progress Notes (Signed)
Interdisciplinary Treatment Plan Update (Adult)  Date: 02/01/2012  Time Reviewed: 9:43 AM   Progress in Treatment: Attending groups: Yes Participating in groups: Yes Taking medication as prescribed:  Yes Tolerating medication:  Yes Family/Significant othe contact made: Exchanging messages with husband currently, now have new number and expect contact Patient understands diagnosis: Yes Discussing patient identified problems/goals with staff: Yes Medical problems stabilized or resolved:  Yes Denies suicidal/homicidal ideation: Yes Issues/concerns per patient self-inventory:  hopelessness Other: N/A  New problem(s) identified: None Identified  Reason for Continuation of Hospitalization: Delusions  Mania Medication stabilization  Interventions implemented related to continuation of hospitalization: mood stabilization, medication monitoring and adjustment, group therapy and psycho education, suicide risk assessment, collateral contact, aftercare planning, ongoing physician assessments and safety checks q 15 mins  Additional comments: N/A  Estimated length of stay: 3-5 days  Discharge Plan:  CSW is assessing for appropriate referrals.   New goal(s): N/A  Review of initial/current patient goals per problem list:    1. Goal(s): Address suicidal ideation  Met: Yes As evidenced by: Pt report 2. Goal (s): Reduce depressive symptoms from a 10 to a 3  Met: No  Target date: 6-7 days  3. Goal (s): Reduce anxiety symptoms from a 10 to a 3  Met: No  Target date: 6-7 days  4.  Goal(s): Reduce delusions, confusion  Met:  No  Target date: 6-7 days   Attendees: Patient:     Family:     Physician:  Dr Jannifer Franklin 02/01/2012 9:43 AM   Nursing:   Vanetta Mulders 02/01/2012 9:43 AM   Clinical Social Worker Carney Bern, LCSWA 02/01/2012 9:43 AM   Other:  Nestor Ramp, RN 02/01/2012 9:43 AM   Other:  Shelda Jakes, PA 02/01/2012 9:43 AM   Other:   02/01/2012 9:43 AM   Other:    02/01/2012 9:43 AM    Scribe for Treatment Team:   Carney Bern, LCSWA  02/01/2012 9:43 AM

## 2012-02-01 NOTE — Progress Notes (Signed)
D: Patient denies SI/HI and auditory and visual hallucinations. The patient has an anxious mood and affect. The patient rates her depression a 0 out of 10 and her hopelessness a 10 out of 10. The patient reports sleeping well and states that her appetite and energy level are normal. The patient is disorganized and has moderate confusion. The patient displays bizarre behavior at times, switching moods quickly/abruptly (starting crying and ends laughing). The patient is interacting within the milieu but requires frequent redirection for her behavior. The patient states that the staff remind her of her "relatives" and that the staff is "good people."  A: Patient given emotional support from RN. Patient encouraged to come to staff with concerns and/or questions. Patient's medication routine continued. Patient's orders and plan of care reviewed.   R: Patient remains cooperative. Will continue to monitor patient q15 minutes for safety.

## 2012-02-01 NOTE — Progress Notes (Signed)
Psychoeducational Group Note  Date:  02/01/2012 Time:  2000  Group Topic/Focus:  Wrap-Up Group:   The focus of this group is to help patients review their daily goal of treatment and discuss progress on daily workbooks.  Participation Level:  Minimal  Participation Quality:  Redirectable  Affect:  Blunted  Cognitive:  Disorganized, Confused, Delusional and Hallucinating  Insight:  Off topic   Engagement in Group:  Limited  Additional Comments:  Pt attended wrap-up this evening. Pt was confused and had to be redirected multiply times while in group. Pt couldn't stay on topic while in group.   Tyshawna Alarid A 02/01/2012, 11:16 PM

## 2012-02-01 NOTE — Clinical Social Work Note (Signed)
[facility] LCSW Group Therapy  02/01/2012 4:54 PM   Type of Therapy:  Group Therapy  Participation Level:  Active  Participation Quality:  Attentive  Affect:  Appropriate  Cognitive:  Disorganized  Insight:  INone  Engagement in Therapy: Minimal  Modes of Intervention:  Clarification, Education, Exploration and Socialization  Summary of Progress/Problems: Today's group focused on relapse prevention.  We defined the term, and then brainstormed on ways to prevent relapse. Pat participated, but had nothing relevant or meaningful to share.  Other patients tolerate her well as she clearly means well and is a sweet heart.  Daryel Gerald B 02/01/2012 , 4:54 PM

## 2012-02-02 DIAGNOSIS — F312 Bipolar disorder, current episode manic severe with psychotic features: Principal | ICD-10-CM

## 2012-02-02 NOTE — Progress Notes (Signed)
D-Patient is confused but redirectable. Observed responding to internal stimuli.  Refused patient inventory sheet.  Denies SI when asked and contracts for safety.  No physical complaints.  Compliant with scheduled medications and no prn's requested. A- Support and redirection given.  Meals to unit to avoid confusion. Continue current POC and evaluation of treatment goals. 15' checks cont for safety. R- Remains safe and redirectable.

## 2012-02-02 NOTE — Progress Notes (Signed)
[facility] MD Progress Note  02/02/2012 2:04 PM [name]  MRN:  [id number]  Subjective: Patient has auditory hallucinations saying hearing wind blowing or raining, difficulty sleeping, disorganized thought process, euphoric mood, giggling,  with flight of ideas and talking to herself.   Diagnosis: Bipolar I Disorder most recent episode manic severe with psychosis  ADL's:  Impaired  Sleep: Poor  Appetite:  Fair  Suicidal Ideation: denies Plan:  denies Intent:  denies Means:  denies Homicidal Ideation:  Plan:  denies Intent:  denies Means:  denies AEB (as evidenced by):  Psychiatric Specialty Exam: Review of Systems  Constitutional: Negative.   HENT: Negative.   Eyes: Negative.   Respiratory: Negative.   Cardiovascular: Negative.   Gastrointestinal: Negative.   Genitourinary: Negative.   Musculoskeletal: Negative.   Skin: Negative.   Neurological: Negative.   Endo/Heme/Allergies: Negative.   Psychiatric/Behavioral: Positive for hallucinations. The patient has insomnia.        Euphoric mood.    Blood pressure 111/75, pulse 68, temperature 98 F (36.7 C), temperature source Oral, resp. rate 16, height 5\' 1"  (1.549 m), weight 122 lb (55.339 kg).Body mass index is 23.05 kg/(m^2).  General Appearance: Casual and Disheveled  Eye Contact::  Good  Speech:  Garbled and Pressured  Volume:  Increased  Mood:  Euphoric  Affect:  Full Range  Thought Process:  Circumstantial, Disorganized and Loose  Orientation:  To place and person not to time  Thought Content:  Delusions  Suicidal Thoughts:  No  Homicidal Thoughts:  No  Memory:  poor  Judgement:  Poor  Insight:  Lacking  Psychomotor Activity:  Increased  Concentration:  Poor  Recall:  Poor  Akathisia:  No  Handed:  Right  AIMS (if indicated):     Assets:  Social Support  Sleep:  Number of Hours: 3.5    Current Medications: Current Facility-Administered Medications  Medication Dose Route Frequency Provider Last  Rate Last Dose  . acetaminophen (TYLENOL) tablet 650 mg  650 mg Oral Q6H PRN Verne Spurr, PA-C      . alum & mag hydroxide-simeth (MAALOX/MYLANTA) 200-200-20 MG/5ML suspension 30 mL  30 mL Oral Q4H PRN Verne Spurr, PA-C      . levothyroxine (SYNTHROID, LEVOTHROID) tablet 125 mcg  125 mcg Oral QAC breakfast Verne Spurr, PA-C   125 mcg at 02/02/12 0636  . lurasidone (LATUDA) tablet 80 mg  80 mg Oral Q supper Mojeed Akintayo   80 mg at 02/01/12 1639  . magnesium hydroxide (MILK OF MAGNESIA) suspension 30 mL  30 mL Oral Daily PRN Verne Spurr, PA-C      . Oxcarbazepine (TRILEPTAL) tablet 300 mg  300 mg Oral BID Mojeed Akintayo   300 mg at 02/02/12 0740  . potassium chloride SA (K-DUR,KLOR-CON) CR tablet 20 mEq  20 mEq Oral Daily Verne Spurr, PA-C   20 mEq at 02/02/12 0740  . traZODone (DESYREL) tablet 50 mg  50 mg Oral QHS PRN,MR X 1 Kerry Hough, PA   50 mg at 02/01/12 4098    Lab Results:  No results found for this or any previous visit (from the past 48 hour(s)).  Physical Findings: AIMS: Facial and Oral Movements Muscles of Facial Expression: None, normal Lips and Perioral Area: None, normal Jaw: None, normal Tongue: None, normal,Extremity Movements Upper (arms, wrists, hands, fingers): None, normal Lower (legs, knees, ankles, toes): None, normal, Trunk Movements Neck, shoulders, hips: None, normal, Overall Severity Severity of abnormal movements (highest score from questions above): None,  normal Patient's awareness of abnormal movements (rate only patient's report): No Awareness, Dental Status Current problems with teeth and/or dentures?: No Does patient usually wear dentures?: No  CIWA:    COWS:     Treatment Plan Summary: Daily contact with patient to assess and evaluate symptoms and progress in treatment Medication management  Plan: 1. Continue  Latuda to 80mg  daily with supper for mood stabilization and psychosis 2. Continue Trileptal to 300mg  twice daily for mood  stabilization.  Medical Decision Making Problem Points:  Established problem, worsening (2), Review of last therapy session (1), Review of psycho-social stressors (1) and Self-limited or minor (1) Data Points:  Order Aims Assessment (2) Review of medication regiment & side effects (2) Review of new medications or change in dosage (2)  I certify that inpatient services furnished can reasonably be expected to improve the patient's condition.   Giovanna Kemmerer,JANARDHAHA R.,MD 02/02/2012, 2:04 PM

## 2012-02-02 NOTE — Clinical Social Work Note (Signed)
[facility] Group Notes:  (Clinical Social Work)  02/02/2012  11:15-11:45AM  Summary of Progress/Problems:   The main focus of today's process group was for the patient to identify ways in which they have in the past sabotaged their own recovery and reasons they may have done this/what they received from doing it.  We then worked to identify a specific plan to avoid doing this when discharged from the hospital for this admission.  The patient expressed delusions and flight of ideas throughout group.  She talked about playing a Christmas song backward, and that would explain why she feels safe.  She stated she came to the hospital for fun, but has found it not to be fun.  She said she is on a 10-year cycle.  It was difficult to follow her, both due to the flight of ideas as well as to the extreme softness of her voice.  Type of Therapy:  Group Therapy - Process  Participation Level:  Active  Participation Quality:  Intrusive  Affect:  Flat  Cognitive:  Disorganized and Confused  Insight:  Poor  Engagement in Therapy:  Limited  Modes of Intervention:  Clarification, Education, Limit-setting, Problem-solving, Socialization, Support and Processing, Exploration, Discussion   Barbara Mantle, LCSW 02/02/2012, 1:08 PM

## 2012-02-02 NOTE — Progress Notes (Signed)
Pt thoughts still remains tangential. Pt has been calm and cooperative tonight and has no not wander the halls this evening ( pt has a hx of wandering the halls randomly at night). Overall pt has been pleasant and remains free of any concerns she wishes for this writer to address at this time.

## 2012-02-02 NOTE — Progress Notes (Signed)
Psychoeducational Group Note  Date:  02/02/2012 Time:  0945 am  Group Topic/Focus:  Identifying Needs:   The focus of this group is to help patients identify their personal needs that have been historically problematic and identify healthy behaviors to address their needs.  Participation Level:  Minimal  Participation Quality:  Intrusive and Redirectable  Affect:  Not Congruent  Cognitive:  Confused  Insight:  Off Topic  Engagement in Group:  Off Topic  Additional Comments:    Andrena Mews 02/02/2012,10:56 AM

## 2012-02-03 NOTE — Progress Notes (Signed)
D-Elley continues to be pleasantly confused with disorganized thoughts and verbilizations.  Attending groups and affect was drowsy. Did not complete patient inventory sheet. Denies SI.  A- Support and encouragement given.  Continue current POC and evaluation of treatment goals. Reorient and redirect a/v hallucinations as needed. Continue 15' checks for safety.  R- Remains safe and redirectable.

## 2012-02-03 NOTE — Progress Notes (Signed)
Psychoeducational Group Note  Date:  02/03/2012 Time:  1015  Group Topic/Focus:  Spirituality:   The focus of this group is to discuss how one's spirituality can aide in recovery.  Participation Level:  None  Participation Quality:  Drowsy  Affect:  Blunted  Cognitive:  Disorganized  Insight:  Lacking  Engagement in Group:  None  Additional Comments:    Cresenciano Lick 02/03/2012, 10:47 AM

## 2012-02-03 NOTE — Progress Notes (Signed)
Patient ID: [name], female   DOB: 1945-10-12, 66 y.o.   MRN: [id number] D. The patient is pleasantly confused. She dressed in a purple coat and gloves, stating that she was going motorcycling with her sister. Came to the medication window several times asking for her medication, even though there was nothing ordered for her at that time.  A. Encouraged to attend evening wrap up group. Met with patient 1:1 to assess. R. The patient did not attend evening wrap up group. She was too disorganized.

## 2012-02-03 NOTE — Clinical Social Work Note (Signed)
[facility] Group Notes:  (Clinical Social Work)  02/03/2012   11:15-11:45AM  Summary of Progress/Problems:  The main focus of today's process group was to listen to a variety of genres of music and to identify that different types of music provoke different responses.  The patient then was able to identify personally what was soothing for them, as well as energizing.  Examples were given of how to use this knowledge in sleep habits, with depression, and with other symptoms.  The patient expressed that she likes [employer], and she enjoyed all the types of music played.  While she stated that the classical was most relaxing to her, it was also during the classical music that she started talking several different times about whether other people were experiencing dizziness or the feeling of walking backwards.  Type of Therapy:  Music Therapy with processing done  Participation Level:  Active  Participation Quality:  Intrusive, Inattentive and Redirectable  Affect:  Blunted  Cognitive:  Confused  Insight:  Improving  Engagement in Therapy:  Improving  Modes of Intervention:   Socialization, Support and Processing, Exploration, Education, Rapport Building   [place], LCSW 02/03/2012, 12:32 PM

## 2012-02-03 NOTE — Progress Notes (Signed)
Patient ID: [name], female   DOB: 22-Apr-1945, 66 y.o.   MRN: [id number] [facility] MD Progress Note  02/03/2012 1:13 PM [name]  MRN:  [id number]  Subjective: Patient stated that her medication has been working pretty good and than stated that people are walking in and out of her bath room and being paranoid. She has disorganized thought process with flight of ideas and talking to herself.   Diagnosis: Bipolar I Disorder most recent episode manic severe with psychosis  ADL's:  Impaired  Sleep: Poor  Appetite:  Fair  Suicidal Ideation: denies Plan:  denies Intent:  denies Means:  denies Homicidal Ideation:  Plan:  denies Intent:  denies Means:  denies AEB (as evidenced by):  Psychiatric Specialty Exam: Review of Systems  Constitutional: Negative.   HENT: Negative.   Eyes: Negative.   Respiratory: Negative.   Cardiovascular: Negative.   Gastrointestinal: Negative.   Genitourinary: Negative.   Musculoskeletal: Negative.   Skin: Negative.   Neurological: Negative.   Endo/Heme/Allergies: Negative.   Psychiatric/Behavioral: Positive for hallucinations. The patient has insomnia.        Euphoric mood.    Blood pressure 111/75, pulse 68, temperature 98 F (36.7 C), temperature source Oral, resp. rate 16, height 5\' 1"  (1.549 m), weight 122 lb (55.339 kg).Body mass index is 23.05 kg/(m^2).  General Appearance: Casual and Disheveled  Eye Contact::  Good  Speech:  Garbled and Pressured  Volume:  Increased  Mood:  Euphoric  Affect:  Full Range  Thought Process:  Circumstantial, Disorganized and Loose  Orientation:  To place and person not to time  Thought Content:  Delusions  Suicidal Thoughts:  No  Homicidal Thoughts:  No  Memory:  poor  Judgement:  Poor  Insight:  Lacking  Psychomotor Activity:  Increased  Concentration:  Poor  Recall:  Poor  Akathisia:  No  Handed:  Right  AIMS (if indicated):     Assets:  Social Support  Sleep:  Number of Hours: 3.75     Current Medications: Current Facility-Administered Medications  Medication Dose Route Frequency Provider Last Rate Last Dose  . acetaminophen (TYLENOL) tablet 650 mg  650 mg Oral Q6H PRN Verne Spurr, PA-C      . alum & mag hydroxide-simeth (MAALOX/MYLANTA) 200-200-20 MG/5ML suspension 30 mL  30 mL Oral Q4H PRN Verne Spurr, PA-C      . levothyroxine (SYNTHROID, LEVOTHROID) tablet 125 mcg  125 mcg Oral QAC breakfast Verne Spurr, PA-C   125 mcg at 02/03/12 0650  . lurasidone (LATUDA) tablet 80 mg  80 mg Oral Q supper Mojeed Akintayo   80 mg at 02/02/12 1715  . magnesium hydroxide (MILK OF MAGNESIA) suspension 30 mL  30 mL Oral Daily PRN Verne Spurr, PA-C      . Oxcarbazepine (TRILEPTAL) tablet 300 mg  300 mg Oral BID Mojeed Akintayo   300 mg at 02/03/12 0807  . potassium chloride SA (K-DUR,KLOR-CON) CR tablet 20 mEq  20 mEq Oral Daily Verne Spurr, PA-C   20 mEq at 02/03/12 7829  . traZODone (DESYREL) tablet 50 mg  50 mg Oral QHS PRN,MR X 1 Kerry Hough, PA   50 mg at 02/02/12 2202    Lab Results:  No results found for this or any previous visit (from the past 48 hour(s)).  Physical Findings: AIMS: Facial and Oral Movements Muscles of Facial Expression: None, normal Lips and Perioral Area: None, normal Jaw: None, normal Tongue: None, normal,Extremity Movements Upper (arms, wrists, hands,  fingers): None, normal Lower (legs, knees, ankles, toes): None, normal, Trunk Movements Neck, shoulders, hips: None, normal, Overall Severity Severity of abnormal movements (highest score from questions above): None, normal Patient's awareness of abnormal movements (rate only patient's report): No Awareness, Dental Status Current problems with teeth and/or dentures?: No Does patient usually wear dentures?: No  CIWA:    COWS:     Treatment Plan Summary: Daily contact with patient to assess and evaluate symptoms and progress in treatment Medication management  Plan: 1. Continue  Latuda  to 80mg  daily with supper for mood stabilization and psychosis 2. Continue Trileptal to 300mg  twice daily for mood stabilization.  Medical Decision Making Problem Points:  Established problem, worsening (2), Review of last therapy session (1), Review of psycho-social stressors (1) and Self-limited or minor (1) Data Points:  Order Aims Assessment (2) Review of medication regiment & side effects (2) Review of new medications or change in dosage (2)  I certify that inpatient services furnished can reasonably be expected to improve the patient's condition.   Carreen Milius,JANARDHAHA R.,MD 02/03/2012, 1:13 PM

## 2012-02-04 MED ORDER — OXCARBAZEPINE 300 MG PO TABS
600.0000 mg | ORAL_TABLET | Freq: Two times a day (BID) | ORAL | Status: DC
  Administered 2012-02-04 – 2012-02-07 (×6): 600 mg via ORAL
  Filled 2012-02-04 (×12): qty 2

## 2012-02-04 NOTE — Progress Notes (Signed)
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  Pt now in room asleep, eyes closed, no apparent distress.  A:  Emotional support provided, encouraged pt to continue with treatment plan and attend all groups and activities.  R:  Pt pleasant and cooperative, depressed mood and anxiety at times.

## 2012-02-04 NOTE — Progress Notes (Signed)
Patient ID: [name], female   DOB: 28-Jan-1946, 66 y.o.   MRN: [id number] D. The patient spent the early part of the evening in bed resting with her eyes closed. As the rest of the unit was getting ready for bed, she woke up. Her thoughts are disorganized. Changes her clothes frequently.  A. Needed frequent redirection. Administered medication. Q 15 minute checks maintained for safety. R. The patient responds to redirection, but needs frequent cues to stat on task. Remains safe.

## 2012-02-04 NOTE — Progress Notes (Signed)
Patient ID: [name], female   DOB: 1945/06/02, 66 y.o.   MRN: [id number] [facility] MD Progress Note  02/04/2012 9:37 AM [name]  MRN:  [id number]  Subjective: Patient reports ongoing auditory hallucinations and difficulty sleeping due to racing thoughts. Patient reports hearing voices telling her what to do like "go pick up your clothes and put them in the basket". She remains euphoric, her thought process remains disorganized with flight of ideas.  Diagnosis: Bipolar I Disorder most recent episode manic severe with psychosis  ADL's:  Impaired  Sleep: Poor  Appetite:  Fair  Suicidal Ideation: denies Plan:  denies Intent:  denies Means:  denies Homicidal Ideation:  Plan:  denies Intent:  denies Means:  denies AEB (as evidenced by):  Psychiatric Specialty Exam: Review of Systems  Constitutional: Negative.   HENT: Negative.   Eyes: Negative.   Respiratory: Negative.   Cardiovascular: Negative.   Gastrointestinal: Negative.   Genitourinary: Negative.   Musculoskeletal: Negative.   Skin: Negative.   Neurological: Negative.   Endo/Heme/Allergies: Negative.   Psychiatric/Behavioral: Positive for hallucinations. The patient has insomnia.        Euphoric mood.    Blood pressure 102/64, pulse 85, temperature 97.1 F (36.2 C), temperature source Oral, resp. rate 20, height 5\' 1"  (1.549 m), weight 55.339 kg (122 lb).Body mass index is 23.05 kg/(m^2).  General Appearance: casual, fairly groomed  Eye Contact::  Good  Speech:  Garbled and Pressured  Volume:  Increased  Mood:  Euphoric  Affect:  Full Range  Thought Process:  Circumstantial, Disorganized and Loose  Orientation:  To place and person not to time  Thought Content:  Delusions  Suicidal Thoughts:  No  Homicidal Thoughts:  No  Memory:  poor  Judgement:  Poor  Insight:  Lacking  Psychomotor Activity:  Increased  Concentration:  Poor  Recall:  Poor  Akathisia:  No  Handed:  Right  AIMS (if indicated):      Assets:  Social Support  Sleep:  Number of Hours: 2.5    Current Medications: Current Facility-Administered Medications  Medication Dose Route Frequency Provider Last Rate Last Dose  . acetaminophen (TYLENOL) tablet 650 mg  650 mg Oral Q6H PRN Verne Spurr, PA-C      . alum & mag hydroxide-simeth (MAALOX/MYLANTA) 200-200-20 MG/5ML suspension 30 mL  30 mL Oral Q4H PRN Verne Spurr, PA-C      . levothyroxine (SYNTHROID, LEVOTHROID) tablet 125 mcg  125 mcg Oral QAC breakfast Verne Spurr, PA-C   125 mcg at 02/04/12 0636  . lurasidone (LATUDA) tablet 80 mg  80 mg Oral Q supper Sondi Desch   80 mg at 02/03/12 1636  . magnesium hydroxide (MILK OF MAGNESIA) suspension 30 mL  30 mL Oral Daily PRN Verne Spurr, PA-C      . Oxcarbazepine (TRILEPTAL) tablet 600 mg  600 mg Oral BID Holland Kotter      . traZODone (DESYREL) tablet 50 mg  50 mg Oral QHS PRN,MR X 1 Kerry Hough, PA   50 mg at 02/04/12 0102    Lab Results:  No results found for this or any previous visit (from the past 48 hour(s)).  Physical Findings: AIMS: Facial and Oral Movements Muscles of Facial Expression: None, normal Lips and Perioral Area: None, normal Jaw: None, normal Tongue: None, normal,Extremity Movements Upper (arms, wrists, hands, fingers): None, normal Lower (legs, knees, ankles, toes): None, normal, Trunk Movements Neck, shoulders, hips: None, normal, Overall Severity Severity of abnormal movements (highest  score from questions above): None, normal Patient's awareness of abnormal movements (rate only patient's report): No Awareness, Dental Status Current problems with teeth and/or dentures?: No Does patient usually wear dentures?: No  CIWA:    COWS:     Treatment Plan Summary: Daily contact with patient to assess and evaluate symptoms and progress in treatment Medication management  Plan: 1. Will continue  Latuda to 80mg  daily with supper for delusions and  psychosis 2. Increase  Trileptal to  600mg  twice daily for mood stabilization.  Medical Decision Making Problem Points:  Established problem, worsening (2), Review of last therapy session (1), Review of psycho-social stressors (1) and Self-limited or minor (1) Data Points:  Order Aims Assessment (2) Review of medication regiment & side effects (2) Review of new medications or change in dosage (2)  I certify that inpatient services furnished can reasonably be expected to improve the patient's condition.   Omar Gayden,MD 02/04/2012, 9:37 AM

## 2012-02-04 NOTE — Progress Notes (Signed)
Pt found on floor by Amalia Hailey, MHT. She states she was trying to take a shower and lost her balance and fell flat on her back. She denies hitting her head and denies any pain. She was somewhat confused, but according to RN reporting off, she appeared to be at baseline. She was assisted to a seated position and she tolerated movement without any sign of discomfort. She was assisted to a chair and tolerated that transfer well. She was taken to her bed where writer asked her to stand to transfer and she tolerated that transfer as well. After she laid down she said she did not have any pain but her hip was numb. Serena Colonel, NP was notified of the fall and she ordered 1:1 observation and for pt to be taken to [facility] to be evaluated.

## 2012-02-04 NOTE — Progress Notes (Signed)
D:Pt is starting to stay on topic longer during a conversation. She denied any pain verbally this morning and turned in her self inventory paper with pain rated as an 8. Pt continues to be easily redirected. A:Supported pt to discuss feelings. Gave medication as ordered and 15 minute checks. R:Pt denies si and hi. Safety maintained on the unit.

## 2012-02-04 NOTE — Clinical Social Work Note (Signed)
[facility] LCSW Group Therapy  02/04/2012 10:10 AM  Type of Therapy:  Discharge Planning  Participation Level:  Minimal  Participation Quality:  Attentive and Sharing  Affect:  Hesitant  Cognitive:  Oriented  Insight:  Improving  Engagement in Therapy:  Limited  Modes of Intervention:  Clarification, Exploration and Support  Summary of Progress/Problems:  [name] was no longer manic as she had been last week in group and presented with some depression.  She did share that she "expreienced a little situation with not wanting to take my meds over the weekend." This was followed by [name] stating her understanding of why she needs medication for stability "although it seems like a lot of medications." [name] was offered support and encouragement. [name] was quiet for remainder of group.   Clide Dales 02/04/2012, 10:10 AM

## 2012-02-05 ENCOUNTER — Encounter (HOSPITAL_COMMUNITY): Payer: Self-pay | Admitting: *Deleted

## 2012-02-05 DIAGNOSIS — F3112 Bipolar disorder, current episode manic without psychotic features, moderate: Secondary | ICD-10-CM | POA: Diagnosis present

## 2012-02-05 DIAGNOSIS — F3110 Bipolar disorder, current episode manic without psychotic features, unspecified: Secondary | ICD-10-CM

## 2012-02-05 LAB — COMPREHENSIVE METABOLIC PANEL
AST: 66 U/L — ABNORMAL HIGH (ref 0–37)
CO2: 24 mEq/L (ref 19–32)
Calcium: 10.8 mg/dL — ABNORMAL HIGH (ref 8.4–10.5)
Chloride: 112 mEq/L (ref 96–112)
Creatinine, Ser: 1.33 mg/dL — ABNORMAL HIGH (ref 0.50–1.10)
GFR calc Af Amer: 48 mL/min — ABNORMAL LOW (ref >90)
GFR calc non Af Amer: 41 mL/min — ABNORMAL LOW (ref >90)
Glucose, Bld: 174 mg/dL — ABNORMAL HIGH (ref 70–99)
Total Bilirubin: 0.4 mg/dL (ref 0.3–1.2)

## 2012-02-05 LAB — CBC WITH DIFFERENTIAL/PLATELET
Basophils Relative: 0 % (ref 0–1)
Eosinophils Absolute: 0.1 10*3/uL (ref 0.0–0.7)
Hemoglobin: 12.1 g/dL (ref 12.0–15.0)
Lymphs Abs: 1 10*3/uL (ref 0.7–4.0)
MCH: 31 pg (ref 26.0–34.0)
MCHC: 31.6 g/dL (ref 30.0–36.0)
Neutro Abs: 6.1 10*3/uL (ref 1.7–7.7)
Neutrophils Relative %: 81 % — ABNORMAL HIGH (ref 43–77)
Platelets: 283 10*3/uL (ref 150–400)
RBC: 3.9 MIL/uL (ref 3.87–5.11)

## 2012-02-05 NOTE — Progress Notes (Signed)
Patient is in the room with her family and still presents disorganized but is pleasant but cooperative; patient denies SI/HI and A/V hallucinations; patient eating food and drinking; patient has no acute distress needs at this time; Patient is appropriate with staff and peers; RN will continue to monitor

## 2012-02-05 NOTE — Progress Notes (Signed)
RN 1:1 Patient is asleep in the bed; patient respirations are even and unlabored; patient has denied any pain in previous conversation; still presents disorganized and has flight of ideas but reports being sleepy today; patient rates depression 8/10; hopelessness 4/10; anxiety 2/10; patient is in no acute distress; patient bp reassessed for orthostatic hypotension; please see doc flowsheets for vitals (120/72 standing and 107/64 sitting) and physician made aware; will continue to monitor patient on 1:1 observation

## 2012-02-05 NOTE — ED Notes (Signed)
Patient is alert and oriented x3.  She is being seen from [facility] post fall. Patient states that she fell and landed on her back and hit the back of her head .  She states her pain is "not bad, She only feels it might be bruised"

## 2012-02-05 NOTE — ED Notes (Signed)
Pt is at baseline per [employer].

## 2012-02-05 NOTE — Progress Notes (Signed)
RN 1:1 note: Patient is quiet and resting with eyes closed; Patient's respirations are even and unlabored; patient is in no distress; patient has a sitter 1:1 observation per physician orders; will continue to monitor

## 2012-02-05 NOTE — Progress Notes (Signed)
Patient ID: [name], female   DOB: 1945/08/12, 66 y.o.   MRN: [id number]  Nursing 1:1 note  D:Pt observed in bed . RR even and unlabored. No distress noted.Pt states she feels better just a little slow. Pt denies SI/HI/AVH. Pt states she visited with granddaughter  Today and that went well.   A: 1:1 observation continues for safety    R: pt remains safe

## 2012-02-05 NOTE — ED Provider Notes (Signed)
History     CSN: [id number]  Arrival date & time 02/04/12  2346   First MD Initiated Contact with Patient 02/05/12 0039      Chief Complaint  Patient presents with  . Fall   HPI  History provided by the patient care provider. Patient is a 66 year old female with history of depression and bipolar disorder who presents from [facility] after a fall. Patient is currently being treated for depression and bipolar disorder. Patient had been sleeping and lying in bed most of the day. She had gotten up quickly to use the bathroom and fell in the bathroom landing on her bottom and back. Patient also believes she hit the back of her head on the floor. Patient denies having syncopal episode. She denies any LOC. She believes she lost her balance. She does report becoming lightheaded. Currently she complains of mild headache. Denies any other injuries or pain.    History reviewed. No pertinent past medical history.  History reviewed. No pertinent past surgical history.  History reviewed. No pertinent family history.  History  Substance Use Topics  . Smoking status: Former Smoker -- 1.0 packs/day for 30 years    Types: Cigarettes  . Smokeless tobacco: Not on file  . Alcohol Use: Not on file    OB History    Grav Para Term Preterm Abortions TAB SAB Ect Mult Living                  Review of Systems  All other systems reviewed and are negative.    Allergies  Sulfa antibiotics  Home Medications   Current Outpatient Rx  Name  Route  Sig  Dispense  Refill  . ESTROGENS CONJUGATED 0.625 MG PO TABS   Oral   Take 0.625 mg by mouth daily.          Marland Kitchen LEVOTHYROXINE SODIUM 125 MCG PO TABS   Oral   Take 125 mcg by mouth daily.         Marland Kitchen LITHIUM CARBONATE ER 450 MG PO TBCR   Oral   Take 450 mg by mouth 2 (two) times daily.         . TRAZODONE HCL 100 MG PO TABS   Oral   Take 100 mg by mouth at bedtime.           BP 116/61  Pulse 80  Temp 98.4 F (36.9 C) (Oral)  Resp 20   Ht 5\' 1"  (1.549 m)  Wt 122 lb (55.339 kg)  BMI 23.05 kg/m2  SpO2 98%  Physical Exam  Nursing note and vitals reviewed. Constitutional: She is oriented to person, place, and time. She appears well-developed and well-nourished. No distress.  HENT:  Head: Normocephalic and atraumatic.  Eyes: Conjunctivae normal and EOM are normal. Pupils are equal, round, and reactive to light.  Neck: Normal range of motion. Neck supple.  Cardiovascular: Normal rate and regular rhythm.   No murmur heard. Pulmonary/Chest: Effort normal and breath sounds normal. No respiratory distress. She has no wheezes. She has no rales.  Abdominal: Soft. There is no tenderness. There is no rigidity, no rebound, no guarding, no CVA tenderness and no tenderness at McBurney's point.  Musculoskeletal: Normal range of motion. She exhibits no edema and no tenderness.  Neurological: She is alert and oriented to person, place, and time. She has normal strength. No cranial nerve deficit or sensory deficit. Gait normal.  Skin: Skin is warm and dry. No rash noted.  Psychiatric: She has a  normal mood and affect. Her behavior is normal.    ED Course  Procedures     1. Fall   2. Orthostatic hypotension       MDM  1:00 AM patient seen and evaluated. Patient is well-appearing in no acute distress. No significant signs of trauma or injury from fall.  Patient is slightly orthostatic with drop in systolic blood pressure. This could be medication induced. Patient does report being well hydrated. Patient had a recent complete workup also included CT scan of head. At this time no signs of injury were symptoms to warrant additional imaging. Patient felt stable for discharge back to [facility].       Angus Seller, PA 02/05/12 [phone]

## 2012-02-05 NOTE — Progress Notes (Signed)
NSG 1:1 note: Pt returned from [facility] in no acute distress. Per report pt was diagnosed with Orthostatic Hypotension. Reminded pt of need to drink plenty of fluids, get up slowly and ask for assistance ambulating. She denies any pain or discomfort. She will be monitored 1:1 per Armandina Stammer, NP. Will continue 1:1 observation and continue current POC.

## 2012-02-05 NOTE — Progress Notes (Signed)
Psychoeducational Group Note  Date:  02/05/2012 Time:  8:15PM  Group Topic/Focus:  Wrap-Up Group:   The focus of this group is to help patients review their daily goal of treatment and discuss progress on daily workbooks.  Participation Level:  Active  Participation Quality:  Appropriate  Affect:  Appropriate  Cognitive:  Alert and Oriented  Insight:  Developing/Improving  Engagement in Group:  Developing/Improving  Additional Comments:  Pt reported that she had a good day. Pt stated that her goal is continue to move forward with her family. Pt reported that she was able to see her granddaughter and husband. Pt stated that tomorrow she wants to work on having a good christmas.   Laureano Hetzer, Randal Buba 02/05/2012, 8:44 PM

## 2012-02-05 NOTE — Progress Notes (Signed)
[facility] MD Progress Note  02/05/2012 12:40 PM [name]  MRN:  [id number] Subjective:  "I'm doing fairly well." Patient fell last night, and was evaluated in ED and returned. Diagnosis:  Bipolar 1 disorder, manic, severe w/psychotic features   ADL's:  Intact  Sleep: Fair  Appetite:  Poor  Suicidal Ideation:  denies Homicidal Ideation:  denies AEB (as evidenced by): patient's verbal response, affect, and written response on daily self inventory.  Psychiatric Specialty Exam: Review of Systems  Constitutional: Negative.  Negative for fever, chills, weight loss, malaise/fatigue and diaphoresis.  HENT: Negative for congestion and sore throat.   Eyes: Negative for blurred vision, double vision and photophobia.  Respiratory: Negative for cough, shortness of breath and wheezing.   Cardiovascular: Negative for chest pain, palpitations and PND.  Gastrointestinal: Negative for heartburn, nausea, vomiting, abdominal pain, diarrhea and constipation.  Musculoskeletal: Negative for myalgias, joint pain and falls.  Neurological: Negative for dizziness, tingling, tremors, sensory change, speech change, focal weakness, seizures, loss of consciousness, weakness and headaches.  Endo/Heme/Allergies: Negative for polydipsia. Does not bruise/bleed easily.  Psychiatric/Behavioral: Negative for depression, suicidal ideas, hallucinations, memory loss and substance abuse. The patient is not nervous/anxious and does not have insomnia.     Blood pressure 107/64, pulse 73, temperature 98.2 F (36.8 C), temperature source Oral, resp. rate 17, height 5\' 1"  (1.549 m), weight 55.339 kg (122 lb), SpO2 96.00%.Body mass index is 23.05 kg/(m^2).  General Appearance: Fairly Groomed  Patent attorney::  Good  Speech:  Normal Rate  Volume:  Normal  Mood:  calm cooperative  Affect:  Congruent  Thought Process:  Disorganized  Orientation:  Continues to be disorganized.  Thought Content:  Delusions  Suicidal Thoughts:   No  Homicidal Thoughts:  No  Memory:  Immediate;   Fair  Judgement:  Impaired  Insight:  Lacking  Psychomotor Activity:  Normal  Concentration:  Fair  Recall:  Fair  Akathisia:  No  Handed:    AIMS (if indicated):     Assets:  Desire for Improvement Financial Resources/Insurance Housing Physical Health Social Support  Sleep:  Number of Hours: 2.5    Current Medications: Current Facility-Administered Medications  Medication Dose Route Frequency Provider Last Rate Last Dose  . acetaminophen (TYLENOL) tablet 650 mg  650 mg Oral Q6H PRN Verne Spurr, PA-C      . alum & mag hydroxide-simeth (MAALOX/MYLANTA) 200-200-20 MG/5ML suspension 30 mL  30 mL Oral Q4H PRN Verne Spurr, PA-C      . levothyroxine (SYNTHROID, LEVOTHROID) tablet 125 mcg  125 mcg Oral QAC breakfast Verne Spurr, PA-C   125 mcg at 02/05/12 0719  . lurasidone (LATUDA) tablet 80 mg  80 mg Oral Q supper Mojeed Akintayo   80 mg at 02/04/12 1639  . magnesium hydroxide (MILK OF MAGNESIA) suspension 30 mL  30 mL Oral Daily PRN Verne Spurr, PA-C      . Oxcarbazepine (TRILEPTAL) tablet 600 mg  600 mg Oral BID Mojeed Akintayo   600 mg at 02/05/12 0810  . traZODone (DESYREL) tablet 50 mg  50 mg Oral QHS PRN,MR X 1 Kerry Hough, PA   50 mg at 02/04/12 0102    Lab Results: No results found for this or any previous visit (from the past 48 hour(s)).  Physical Findings: AIMS: Facial and Oral Movements Muscles of Facial Expression: None, normal Lips and Perioral Area: None, normal Jaw: None, normal Tongue: None, normal,Extremity Movements Upper (arms, wrists, hands, fingers): None, normal Lower (legs, knees,  ankles, toes): None, normal, Trunk Movements Neck, shoulders, hips: None, normal, Overall Severity Severity of abnormal movements (highest score from questions above): None, normal Patient's awareness of abnormal movements (rate only patient's report): No Awareness, Dental Status Current problems with teeth and/or  dentures?: No Does patient usually wear dentures?: No  CIWA:    COWS:     Treatment Plan Summary: Daily contact with patient to assess and evaluate symptoms and progress in treatment Medication management  Plan: 1. Reviewed labs, medication, and saw patient. 2. Will order CMP and CBC to follow up on hypokalemia, and electrolyte status. 3. Continue current plan of care as written with no changes at this time.  Medical Decision Making Problem Points:  Established problem, stable/improving (1) and New problem, with no additional work-up planned (3) Data Points:  Review or order clinical lab tests (1) Review or order medicine tests (1)  I certify that inpatient services furnished can reasonably be expected to improve the patient's condition.   Rona Ravens. Kashawn Manzano PAC 02/05/2012, 12:40 PM

## 2012-02-05 NOTE — ED Notes (Signed)
Behavioral health tech at bedside.

## 2012-02-05 NOTE — ED Notes (Signed)
Contacted security for transportation.

## 2012-02-05 NOTE — ED Provider Notes (Signed)
Medical screening examination/treatment/procedure(s) were performed by non-physician practitioner and as supervising physician I was immediately available for consultation/collaboration.   Chaya Dehaan, MD 02/05/12 0648 

## 2012-02-05 NOTE — Progress Notes (Signed)
NSG 1:1 note. Pt currently resting quietly in bed with eyes closed. Respirations are even and unlabored. No acute distress noted. 1:1 sitter noted at pts bedside per MD order. Safety has been maintained with 1:1 observation. Will continue to monitor 1:1 and continue current POC. 

## 2012-02-06 DIAGNOSIS — F3160 Bipolar disorder, current episode mixed, unspecified: Secondary | ICD-10-CM

## 2012-02-06 LAB — GLUCOSE, CAPILLARY: Glucose-Capillary: 99 mg/dL (ref 70–99)

## 2012-02-06 MED ORDER — ONDANSETRON 4 MG PO TBDP
4.0000 mg | ORAL_TABLET | ORAL | Status: DC | PRN
  Administered 2012-02-06: 4 mg via ORAL

## 2012-02-06 MED ORDER — ENSURE COMPLETE PO LIQD
237.0000 mL | Freq: Two times a day (BID) | ORAL | Status: DC
  Administered 2012-02-06 – 2012-02-09 (×5): 237 mL via ORAL
  Filled 2012-02-06 (×3): qty 237

## 2012-02-06 MED ORDER — ONDANSETRON 4 MG PO TBDP
ORAL_TABLET | ORAL | Status: AC
  Filled 2012-02-06: qty 1

## 2012-02-06 NOTE — Progress Notes (Addendum)
(  D) Patient alert with brief eye contact. When patient asked how close to base line she felt, pt stated that she was experiencing some blurred vision, and restless legs. She also stated that she could not remember the events leading to her admission and this bothered her. Patient able to talk about family and hobbies. (A) Encouraged and supported. (R) Patient resting comfortably with 1:1 observation. Joice Lofts RN MS EdS 02/06/2012  8:20 PM  1900: Patient given evening dose of Latuda and Trileptal at 1732. Husband in to see patient at 68. Patient throwing up what fluids she had ingested at dinnertime.. Decreased appetite, no solids at dinnertime. Abdomen distended but soft. Patient and husband questioned on patient's regular bowl pattern and stated that constipation was an issue at home and that her bowl pattern was every three to four days. Neither could recall last BM.  Patient shared that she might be constipated. Patient also anxious after emesis. Rae Halsted PA called with resulting suggestion that Milk of Magnesia be given at bedtime. Patient and husband in agreement. Patient given ice chips and a cool cloth. Husband shared that he has seen a pattern of patient becoming nauseated an hour after evening medications are administered. Husband also shared that the evening meds make pt sleepy which staff has observed. 1:1 observation continues. Joice Lofts RN MS EdS 02/06/2012  8:31 PM  2220 Writer and MHT attempted to have patient dangle legs and then stand. Unable to do so independently. Extremely sleepy. Patient was assisted with taking MOM. Both MHT and writer observed that each time patient was given fluids she coughed; less so with ice chips. Patient had approximately 120 cc's of fluid from 1500-2200. Patient assisted to bathroom with wheelchair. Patient unable to urinate. MHt sitting on 1:1 observation instructed to offer ice chips and fluids frequently. 1:1 observation continues. Joice Lofts RN MS EdS 02/06/2012  10:25 PM

## 2012-02-06 NOTE — Progress Notes (Signed)
[facility] MD Progress Note  02/06/2012 11:29 AM [name]  MRN:  [id number] Subjective:  [name] is complaining of being nauseated, and reports she has vomited twice today. She denies any fever or chills, or body aches. Her appetite has been decreased, and she reports she has not had a bowel movement since she has been admitted here.  Although she does not seem to have good insight, she denies having any suicidal or homicidal ideation today. She also denies any auditory or visual hallucinations. She denies that she has any depression or anxiety.  Per report by the patient's nurse, the patient has not been consuming enough to have had regular bowel movement. In the nurse's opinion her nausea is do to dizziness. The patient was transported to the emergency department and diagnosed with orthostatic hypotension and encouraged to drink fluids. In the nurse's opinion the dizziness may be caused by the Trileptal.  Diagnosis:  Axis I: Bipolar, mixed Axis II: Deferred Axis III: History reviewed. No pertinent past medical history.  ADL's:  Intact  Sleep: Good  Appetite:  Poor  Suicidal Ideation:  Patient denies any thought, plan, or intent Homicidal Ideation:  Patient denies any thought, plan, or intent AEB (as evidenced by):  Psychiatric Specialty Exam: Review of Systems  Constitutional: Negative.   HENT: Negative.   Eyes: Negative.   Respiratory: Negative.   Cardiovascular: Negative.   Gastrointestinal: Positive for nausea, vomiting and constipation.  Genitourinary: Negative.   Musculoskeletal:       Right hip pain   Skin: Negative.   Neurological: Positive for dizziness.  Endo/Heme/Allergies: Negative.   Psychiatric/Behavioral: Negative.     Blood pressure 126/73, pulse 75, temperature 98.3 F (36.8 C), temperature source Oral, resp. rate 18, height 5\' 1"  (1.549 m), weight 55.339 kg (122 lb), SpO2 96.00%.Body mass index is 23.05 kg/(m^2).  General Appearance: Casual  Eye  Contact::  Good  Speech:  Clear and Coherent  Volume:  Normal  Mood:  Anxious and Dysphoric  Affect:  Congruent  Thought Process:  Coherent  Orientation:  Full (Time, Place, and Person)  Thought Content:  WDL  Suicidal Thoughts:  No  Homicidal Thoughts:  No  Memory:  Immediate;   Fair Recent;   Fair Remote;   Fair  Judgement:  Impaired  Insight:  Lacking  Psychomotor Activity:  Normal  Concentration:  Good  Recall:  Fair  Akathisia:  No  Handed:    AIMS (if indicated):     Assets:  Communication Skills Desire for Improvement  Sleep:  Number of Hours: 3.5    Current Medications: Current Facility-Administered Medications  Medication Dose Route Frequency Provider Last Rate Last Dose  . acetaminophen (TYLENOL) tablet 650 mg  650 mg Oral Q6H PRN Verne Spurr, PA-C      . alum & mag hydroxide-simeth (MAALOX/MYLANTA) 200-200-20 MG/5ML suspension 30 mL  30 mL Oral Q4H PRN Verne Spurr, PA-C      . levothyroxine (SYNTHROID, LEVOTHROID) tablet 125 mcg  125 mcg Oral QAC breakfast Verne Spurr, PA-C   125 mcg at 02/06/12 0710  . lurasidone (LATUDA) tablet 80 mg  80 mg Oral Q supper Mojeed Akintayo   80 mg at 02/05/12 1652  . magnesium hydroxide (MILK OF MAGNESIA) suspension 30 mL  30 mL Oral [name] PRN Verne Spurr, PA-C      . ondansetron (ZOFRAN-ODT) 4 MG disintegrating tablet           . ondansetron (ZOFRAN-ODT) disintegrating tablet 4 mg  4 mg Oral  Q4H PRN Mike Craze, MD   4 mg at 02/06/12 1028  . Oxcarbazepine (TRILEPTAL) tablet 600 mg  600 mg Oral BID Mojeed Akintayo   600 mg at 02/06/12 0754  . traZODone (DESYREL) tablet 50 mg  50 mg Oral QHS PRN,MR X 1 Kerry Hough, PA   50 mg at 02/04/12 0102    Lab Results:  Results for orders placed during the hospital encounter of 01/29/12 (from the past 48 hour(s))  COMPREHENSIVE METABOLIC PANEL     Status: Abnormal   Collection Time   02/05/12  7:47 PM      Component Value Range Comment   Sodium 144  135 - 145 mEq/L     Potassium 3.5  3.5 - 5.1 mEq/L    Chloride 112  96 - 112 mEq/L    CO2 24  19 - 32 mEq/L    Glucose, Bld 174 (*) 70 - 99 mg/dL    BUN 9  6 - 23 mg/dL    Creatinine, Ser 1.61 (*) 0.50 - 1.10 mg/dL    Calcium 09.6 (*) 8.4 - 10.5 mg/dL    Total Protein 7.1  6.0 - 8.3 g/dL    Albumin 3.5  3.5 - 5.2 g/dL    AST 66 (*) 0 - 37 U/L    ALT 35  0 - 35 U/L    Alkaline Phosphatase 71  39 - 117 U/L    Total Bilirubin 0.4  0.3 - 1.2 mg/dL    GFR calc non Af Amer 41 (*) >90 mL/min    GFR calc Af Amer 48 (*) >90 mL/min   CBC WITH DIFFERENTIAL     Status: Abnormal   Collection Time   02/05/12  7:47 PM      Component Value Range Comment   WBC 7.6  4.0 - 10.5 K/uL    RBC 3.90  3.87 - 5.11 MIL/uL    Hemoglobin 12.1  12.0 - 15.0 g/dL    HCT 04.5  40.9 - 81.1 %    MCV 98.2  78.0 - 100.0 fL    MCH 31.0  26.0 - 34.0 pg    MCHC 31.6  30.0 - 36.0 g/dL    RDW 91.4  78.2 - 95.6 %    Platelets 283  150 - 400 K/uL    Neutrophils Relative 81 (*) 43 - 77 %    Neutro Abs 6.1  1.7 - 7.7 K/uL    Lymphocytes Relative 14  12 - 46 %    Lymphs Abs 1.0  0.7 - 4.0 K/uL    Monocytes Relative 4  3 - 12 %    Monocytes Absolute 0.3  0.1 - 1.0 K/uL    Eosinophils Relative 1  0 - 5 %    Eosinophils Absolute 0.1  0.0 - 0.7 K/uL    Basophils Relative 0  0 - 1 %    Basophils Absolute 0.0  0.0 - 0.1 K/uL   GLUCOSE, CAPILLARY     Status: Normal   Collection Time   02/06/12 10:27 AM      Component Value Range Comment   Glucose-Capillary 99  70 - 99 mg/dL     Physical Findings: AIMS: Facial and Oral Movements Muscles of Facial Expression: None, normal Lips and Perioral Area: None, normal Jaw: None, normal Tongue: None, normal,Extremity Movements Upper (arms, wrists, hands, fingers): None, normal Lower (legs, knees, ankles, toes): None, normal, Trunk Movements Neck, shoulders, hips: None, normal, Overall Severity Severity of abnormal  movements (highest score from questions above): None, normal Patient's awareness of  abnormal movements (rate only patient's report): No Awareness, Dental Status Current problems with teeth and/or dentures?: No Does patient usually wear dentures?: No  CIWA:    COWS:     Treatment Plan Summary: [name] contact with patient to assess and evaluate symptoms and progress in treatment Medication management  Plan: We will order her some Ensure dietary supplement and continue to encourage fluid intake. Otherwise we will continue her current plan of care. Medical Decision Making Problem Points:  Established problem, stable/improving (1) Data Points:  Review and summation of old records (2) Review of medication regiment & side effects (2)  I certify that inpatient services furnished can reasonably be expected to improve the patient's condition.   Natalea Sutliff 02/06/2012, 11:29 AM

## 2012-02-06 NOTE — Progress Notes (Addendum)
Patient having complaints of dizziness and patient is unsteady on feet; patient reports also being nauseated and ; vital signs were checked and her BP was 123/74 and hr 78 sitting and her BP was 113/72 and hr 75 standing; blood sugar was also checked and it was 99; patient also received some Zofran and afterwards she did vomit the Gatorade she had prior to the medicine; ; Patient was placed back in the bed; head of bed elevated; patient reports feeling a little better and has closed her eyes and is now silent; Dr. Dan Humphreys and physician assistant made aware of the patient's condition  Patient has not had a bowel movement in a few days but patient has not been consuming much of her meals and patient has been encouraged to drink and eat; patient is on a 1:1 observation

## 2012-02-06 NOTE — Progress Notes (Signed)
Patient ID: SELIA WAREING, female DOB: 1945-10-09, 66 y.o. MRN: [id number]  Nursing 1:1 note   D:Pt observed in bed in fetal position, not sleep. RR even and unlabored. No distress noted. Pt had upset stomach at 0330 with N/V. A: 1:1 observation continues for safety. Pt offered ginger ale, crackers, and Mylanta for upset stomach. R: pt remains safe. Pt drank ginger ale and ate some crackers.

## 2012-02-06 NOTE — Progress Notes (Signed)
RN 1:1 Note: Patient is laying in the bed interacting with her family members; patient states that her nausea has decreased and that when sitting up she does have some dizziness; patient has started with her first Ensure and seems to be tolerating it; patient has no acute distress this time; physician is aware of patient status; will continue to monitor; patient is continually encouraged to drink fluids and how to properly move when changing positions

## 2012-02-06 NOTE — Progress Notes (Addendum)
Patient ID: [name], female DOB: 1945-11-26, 66 y.o. MRN: [id number]  Nursing 1:1 note   D:Pt observed in bed in fetal position, appeared to be sleep . RR even and unlabored. No distress noted.   A: 1:1 observation continues for safety   R: pt remains safe

## 2012-02-06 NOTE — Progress Notes (Signed)
RN 1:1 Note:  Patient denies SI/HI and A/V hallucinations; patient reports sleep to be well but appetite poor; rates depression 2/10; hopelessness 0/10; and anxiety 0/10; patient is disorganized and can become preoccupied at times but can answer your questions appropriately when prompted; patient has no acute distress at this time; will continue to monitor

## 2012-02-07 LAB — BASIC METABOLIC PANEL
Calcium: 11.6 mg/dL — ABNORMAL HIGH (ref 8.4–10.5)
GFR calc non Af Amer: 46 mL/min — ABNORMAL LOW (ref >90)
Potassium: 3.6 mEq/L (ref 3.5–5.1)
Sodium: 148 mEq/L — ABNORMAL HIGH (ref 135–145)

## 2012-02-07 MED ORDER — OXCARBAZEPINE 300 MG PO TABS
600.0000 mg | ORAL_TABLET | Freq: Every day | ORAL | Status: DC
  Administered 2012-02-07 – 2012-02-09 (×3): 600 mg via ORAL
  Filled 2012-02-07 (×5): qty 2

## 2012-02-07 MED ORDER — OXCARBAZEPINE 300 MG PO TABS: 450.0000 mg | ORAL_TABLET | Freq: Two times a day (BID) | ORAL | Status: DC

## 2012-02-07 MED ORDER — BENZTROPINE MESYLATE 1 MG PO TABS
1.0000 mg | ORAL_TABLET | Freq: Every day | ORAL | Status: DC
  Administered 2012-02-07 – 2012-02-09 (×3): 1 mg via ORAL
  Filled 2012-02-07 (×6): qty 1

## 2012-02-07 MED ORDER — OXCARBAZEPINE 300 MG PO TABS
300.0000 mg | ORAL_TABLET | Freq: Every day | ORAL | Status: DC
  Administered 2012-02-08: 300 mg via ORAL
  Filled 2012-02-07 (×2): qty 1

## 2012-02-07 NOTE — Progress Notes (Signed)
D:  Patient in bed much of the day.  States she feels dizzy and weak.  Appetite poor.  Blood pressure within normal range.  Rates depression and hopelessness at 2 and denies anxiety.  No further vomiting today, but patient does state she has had no BM in several days.   A:  Vital signs rechecked and were found to be within normal range.  Patient was allowed to stay in bed.  Fluids were encouraged.   R:  Feeling down today

## 2012-02-07 NOTE — Progress Notes (Signed)
D:  Patient continues to lie in the bed and states she gets dizzy when she sits up.  Awaiting dinner at this time and states she is going to try to eat a little bit.  A:  Encouraged patient to eat when dinner came.  Reminded her to ask for help if she feels unsafe.  Sitter remains in the room.   R:  Pleasant and cooperative.  Remains safe.

## 2012-02-07 NOTE — Progress Notes (Signed)
D:  Patient continues to stay in her room.  Continues to complain of feeling dizzy when she sits up.  A:  Encouraged increased fluid intake.   R:  Patient quiet.  Remains safe on the unit.

## 2012-02-07 NOTE — Progress Notes (Signed)
Patient ID: [name], female   DOB: 03-19-1945, 66 y.o.   MRN: [id number] D: Pt. In bed eyes closed, resp. Even. A: Writer will assess for s/s of distress. Staff will monitor 1:1 for safety. R: Pt. Is safe on the unit, sitter at bedside, no distress noted, resp. Unlabored.

## 2012-02-07 NOTE — Progress Notes (Signed)
[facility] Group Notes:  (Counselor/Nursing/MHT/Case Management/Adjunct)  02/07/2012 5:40 PM  Type of Therapy:  Discharge Planning and Group Therapy  Participation Level:  Did Not Attend  CSW checked in with MHT serving as 1:1 with patient earlier today. MHT reports patient not feeling well.    Clide Dales 02/07/2012, 5:40 PM

## 2012-02-07 NOTE — Progress Notes (Signed)
Patient ID: [name], female   DOB: 1945-04-10, 66 y.o.   MRN: [id number] [facility] MD Progress Note  02/07/2012 10:38 AM [name]  MRN:  [id number]  Subjective: Patient states she is doing a little better but continue to reports intermittent auditory hallucinations, difficulty sleeping and racing thoughts. Patient denies delusional thinking and mood lability. Her thought process is mor organized. She is on 1:1 observation for fall risk. She is compliant with her medications and did not report any adverse reactions.  Diagnosis: Bipolar I Disorder most recent episode manic severe with psychosis  ADL's:  Impaired  Sleep: fair  Appetite:  Fair  Suicidal Ideation: denies Plan:  denies Intent:  denies Means:  denies Homicidal Ideation:  Plan:  denies Intent:  denies Means:  denies AEB (as evidenced by):  Psychiatric Specialty Exam: Review of Systems  Constitutional: Negative.   HENT: Negative.   Eyes: Negative.   Respiratory: Negative.   Cardiovascular: Negative.   Gastrointestinal: Negative.   Genitourinary: Negative.   Musculoskeletal: Negative.   Skin: Negative.   Neurological: Negative.   Endo/Heme/Allergies: Negative.   Psychiatric/Behavioral: Positive for hallucinations.       Euphoric mood.    Blood pressure 112/69, pulse 82, temperature 97.3 F (36.3 C), temperature source Oral, resp. rate 18, height 5\' 1"  (1.549 m), weight 55.339 kg (122 lb), SpO2 96.00%.Body mass index is 23.05 kg/(m^2).  General Appearance: casual, fairly groomed  Eye Contact::  Good  Speech:  Garbled and Pressured  Volume:  Increased  Mood:  Euphoric  Affect:  Full Range  Thought Process:  Circumstantial, Disorganized and Loose  Orientation:  To place and person not to time  Thought Content:  Delusions  Suicidal Thoughts:  No  Homicidal Thoughts:  No  Memory:  poor  Judgement:  Poor  Insight:  Lacking  Psychomotor Activity:  Increased  Concentration:  Poor  Recall:  Poor    Akathisia:  No  Handed:  Right  AIMS (if indicated):     Assets:  Social Support  Sleep:  Number of Hours: 6.25    Current Medications: Current Facility-Administered Medications  Medication Dose Route Frequency Provider Last Rate Last Dose  . acetaminophen (TYLENOL) tablet 650 mg  650 mg Oral Q6H PRN Verne Spurr, PA-C      . alum & mag hydroxide-simeth (MAALOX/MYLANTA) 200-200-20 MG/5ML suspension 30 mL  30 mL Oral Q4H PRN Verne Spurr, PA-C      . benztropine (COGENTIN) tablet 1 mg  1 mg Oral Daily Andreana Klingerman      . feeding supplement (ENSURE COMPLETE) liquid 237 mL  237 mL Oral BID BM Jorje Guild, PA-C   237 mL at 02/06/12 1400  . levothyroxine (SYNTHROID, LEVOTHROID) tablet 125 mcg  125 mcg Oral QAC breakfast Verne Spurr, PA-C   125 mcg at 02/07/12 [phone]  . lurasidone (LATUDA) tablet 80 mg  80 mg Oral Q supper Maiana Hennigan   80 mg at 02/06/12 1732  . magnesium hydroxide (MILK OF MAGNESIA) suspension 30 mL  30 mL Oral Daily PRN Verne Spurr, PA-C   30 mL at 02/06/12 2201  . ondansetron (ZOFRAN-ODT) disintegrating tablet 4 mg  4 mg Oral Q4H PRN Mike Craze, MD   4 mg at 02/06/12 1028  . Oxcarbazepine (TRILEPTAL) tablet 300 mg  300 mg Oral Daily Anjannette Gauger      . Oxcarbazepine (TRILEPTAL) tablet 600 mg  600 mg Oral QHS Cimberly Stoffel      . traZODone (DESYREL) tablet  50 mg  50 mg Oral QHS PRN,MR X 1 Kerry Hough, PA   50 mg at 02/04/12 0102    Lab Results:  Results for orders placed during the hospital encounter of 01/29/12 (from the past 48 hour(s))  COMPREHENSIVE METABOLIC PANEL     Status: Abnormal   Collection Time   02/05/12  7:47 PM      Component Value Range Comment   Sodium 144  135 - 145 mEq/L    Potassium 3.5  3.5 - 5.1 mEq/L    Chloride 112  96 - 112 mEq/L    CO2 24  19 - 32 mEq/L    Glucose, Bld 174 (*) 70 - 99 mg/dL    BUN 9  6 - 23 mg/dL    Creatinine, Ser 1.61 (*) 0.50 - 1.10 mg/dL    Calcium 09.6 (*) 8.4 - 10.5 mg/dL    Total Protein 7.1   6.0 - 8.3 g/dL    Albumin 3.5  3.5 - 5.2 g/dL    AST 66 (*) 0 - 37 U/L    ALT 35  0 - 35 U/L    Alkaline Phosphatase 71  39 - 117 U/L    Total Bilirubin 0.4  0.3 - 1.2 mg/dL    GFR calc non Af Amer 41 (*) >90 mL/min    GFR calc Af Amer 48 (*) >90 mL/min   CBC WITH DIFFERENTIAL     Status: Abnormal   Collection Time   02/05/12  7:47 PM      Component Value Range Comment   WBC 7.6  4.0 - 10.5 K/uL    RBC 3.90  3.87 - 5.11 MIL/uL    Hemoglobin 12.1  12.0 - 15.0 g/dL    HCT 04.5  40.9 - 81.1 %    MCV 98.2  78.0 - 100.0 fL    MCH 31.0  26.0 - 34.0 pg    MCHC 31.6  30.0 - 36.0 g/dL    RDW 91.4  78.2 - 95.6 %    Platelets 283  150 - 400 K/uL    Neutrophils Relative 81 (*) 43 - 77 %    Neutro Abs 6.1  1.7 - 7.7 K/uL    Lymphocytes Relative 14  12 - 46 %    Lymphs Abs 1.0  0.7 - 4.0 K/uL    Monocytes Relative 4  3 - 12 %    Monocytes Absolute 0.3  0.1 - 1.0 K/uL    Eosinophils Relative 1  0 - 5 %    Eosinophils Absolute 0.1  0.0 - 0.7 K/uL    Basophils Relative 0  0 - 1 %    Basophils Absolute 0.0  0.0 - 0.1 K/uL   GLUCOSE, CAPILLARY     Status: Normal   Collection Time   02/06/12 10:27 AM      Component Value Range Comment   Glucose-Capillary 99  70 - 99 mg/dL   BASIC METABOLIC PANEL     Status: Abnormal   Collection Time   02/07/12  6:27 AM      Component Value Range Comment   Sodium 148 (*) 135 - 145 mEq/L    Potassium 3.6  3.5 - 5.1 mEq/L    Chloride 113 (*) 96 - 112 mEq/L    CO2 25  19 - 32 mEq/L    Glucose, Bld 122 (*) 70 - 99 mg/dL    BUN 11  6 - 23 mg/dL    Creatinine, Ser  1.22 (*) 0.50 - 1.10 mg/dL    Calcium 40.9 (*) 8.4 - 10.5 mg/dL    GFR calc non Af Amer 46 (*) >90 mL/min    GFR calc Af Amer 53 (*) >90 mL/min     Physical Findings: AIMS: Facial and Oral Movements Muscles of Facial Expression:  (Pt. in bed,eyes closed, no abnormal movement noted.) Lips and Perioral Area: None, normal Jaw: None, normal Tongue: None, normal,Extremity Movements Upper (arms,  wrists, hands, fingers): None, normal Lower (legs, knees, ankles, toes): None, normal, Trunk Movements Neck, shoulders, hips: None, normal, Overall Severity Severity of abnormal movements (highest score from questions above): None, normal Patient's awareness of abnormal movements (rate only patient's report): No Awareness, Dental Status Current problems with teeth and/or dentures?: No Does patient usually wear dentures?: No  CIWA:    COWS:     Treatment Plan Summary: Daily contact with patient to assess and evaluate symptoms and progress in treatment Medication management  Plan: 1. Will continue  Latuda to 80mg  daily with supper for delusions and  psychosis 2. Decrease  Trileptal to 300mg  Qam and  600mg  daily at bedtime for mood stabilization. 3. Add Cogenting 1mg  daily to prevent EPS.  Medical Decision Making Problem Points:  Established problem, worsening (2), Review of last therapy session (1), Review of psycho-social stressors (1) and Self-limited or minor (1) Data Points:  Order Aims Assessment (2) Review of medication regiment & side effects (2) Review of new medications or change in dosage (2)  I certify that inpatient services furnished can reasonably be expected to improve the patient's condition.   Jamyson Jirak,MD 02/07/2012, 10:38 AM

## 2012-02-07 NOTE — Progress Notes (Signed)
Nutrition Brief Note  Patient identified on the Malnutrition Screening Tool (MST) Report  Body mass index is 23.05 kg/(m^2). Pt meets criteria for wnl based on current BMI.   Current diet order is Regular, however appetite and intake are poor. Labs and medications reviewed.   Pt with constipation (last BM reportedly 12/21) and vomiting for the past few days.  Pt continues with poor PO intake.  Pt refused Ensure this am.  Recommend continue to offer Gatorade and fluids and encourage intake. Interventions limited if pt is refusing supplements, recommend snack with medication administration. If nutrition issues arise, please consult RD. Will follow for improved intake.  Loyce Dys, MS RD LDN Clinical Inpatient Dietitian Pager: [phone] Weekend/After hours pager: [phone]

## 2012-02-07 NOTE — Progress Notes (Signed)
Patient ID: [name], female   DOB: 1946-01-22, 66 y.o.   MRN: [id number] 1:1 notes  02/07/2012 @ 2200 D: Patient in bed sleeping. Respiration regular and unlabored. No sign of distress noted at this time A: 1:1 observation for safety R: Patient remains asleep. 1:1 continues

## 2012-02-08 MED ORDER — OXCARBAZEPINE 300 MG PO TABS
300.0000 mg | ORAL_TABLET | Freq: Two times a day (BID) | ORAL | Status: DC
  Administered 2012-02-08 – 2012-02-09 (×3): 300 mg via ORAL
  Filled 2012-02-08 (×8): qty 1

## 2012-02-08 NOTE — Progress Notes (Signed)
Patient ID: [name], female   DOB: [birth year], 66 y.o.   MRN: [id number] [facility] MD Progress Note  02/08/2012 11:17 AM [name]  MRN:  [id number]  Subjective: Patient states that she is feeling dizzy today and continue to reports intermittent auditory hallucinations. She reports decreased racing thoughts and sleeping better. Patient denies delusional thinking and mood lability. Her thought process is more organized. She is on 1:1 observation for fall risk. She is compliant with her medications and did not report any adverse reactions.  Diagnosis: Bipolar I Disorder most recent episode manic severe with psychosis  ADL's:  Impaired  Sleep: fair  Appetite:  Fair  Suicidal Ideation: denies Plan:  denies Intent:  denies Means:  denies Homicidal Ideation:  Plan:  denies Intent:  denies Means:  denies AEB (as evidenced by):  Psychiatric Specialty Exam: Review of Systems  Constitutional: Negative.   HENT: Negative.   Eyes: Negative.   Respiratory: Negative.   Cardiovascular: Negative.   Gastrointestinal: Negative.   Genitourinary: Negative.   Musculoskeletal: Negative.   Skin: Negative.   Neurological: Negative.   Endo/Heme/Allergies: Negative.   Psychiatric/Behavioral: Positive for hallucinations.       Euphoric mood.    Blood pressure 91/59, pulse 71, temperature 98.3 F (36.8 C), temperature source Oral, resp. rate 16, height 5\' 1"  (1.549 m), weight 55.339 kg (122 lb), SpO2 96.00%.Body mass index is 23.05 kg/(m^2).  General Appearance: casual, fairly groomed  Eye Contact::  Good  Speech:  Garbled and Pressured  Volume:  Increased  Mood:  Euphoric  Affect:  Full Range  Thought Process:  Circumstantial, Disorganized and Loose  Orientation:  To place and person not to time  Thought Content:  Delusions  Suicidal Thoughts:  No  Homicidal Thoughts:  No  Memory:  poor  Judgement:  Poor  Insight:  Lacking  Psychomotor Activity:  Increased  Concentration:  Poor    Recall:  Poor  Akathisia:  No  Handed:  Right  AIMS (if indicated):     Assets:  Social Support  Sleep:  Number of Hours: 3    Current Medications: Current Facility-Administered Medications  Medication Dose Route Frequency Provider Last Rate Last Dose  . acetaminophen (TYLENOL) tablet 650 mg  650 mg Oral Q6H PRN Verne Spurr, PA-C      . alum & mag hydroxide-simeth (MAALOX/MYLANTA) 200-200-20 MG/5ML suspension 30 mL  30 mL Oral Q4H PRN Verne Spurr, PA-C      . benztropine (COGENTIN) tablet 1 mg  1 mg Oral Daily Madysyn Hanken   1 mg at 02/08/12 0801  . feeding supplement (ENSURE COMPLETE) liquid 237 mL  237 mL Oral BID BM Jorje Guild, PA-C   237 mL at 02/08/12 0804  . levothyroxine (SYNTHROID, LEVOTHROID) tablet 125 mcg  125 mcg Oral QAC breakfast Verne Spurr, PA-C   125 mcg at 02/08/12 7846  . lurasidone (LATUDA) tablet 80 mg  80 mg Oral Q supper Adonna Horsley   80 mg at 02/07/12 1744  . magnesium hydroxide (MILK OF MAGNESIA) suspension 30 mL  30 mL Oral Daily PRN Verne Spurr, PA-C   30 mL at 02/06/12 2201  . ondansetron (ZOFRAN-ODT) disintegrating tablet 4 mg  4 mg Oral Q4H PRN Mike Craze, MD   4 mg at 02/06/12 1028  . Oxcarbazepine (TRILEPTAL) tablet 300 mg  300 mg Oral Daily Zeus Marquis   300 mg at 02/08/12 0758  . Oxcarbazepine (TRILEPTAL) tablet 600 mg  600 mg Oral QHS Jairo Bellew  600 mg at 02/07/12 2115  . traZODone (DESYREL) tablet 50 mg  50 mg Oral QHS PRN,MR X 1 Kerry Hough, PA   50 mg at 02/08/12 0153    Lab Results:  Results for orders placed during the hospital encounter of 01/29/12 (from the past 48 hour(s))  BASIC METABOLIC PANEL     Status: Abnormal   Collection Time   02/07/12  6:27 AM      Component Value Range Comment   Sodium 148 (*) 135 - 145 mEq/L    Potassium 3.6  3.5 - 5.1 mEq/L    Chloride 113 (*) 96 - 112 mEq/L    CO2 25  19 - 32 mEq/L    Glucose, Bld 122 (*) 70 - 99 mg/dL    BUN 11  6 - 23 mg/dL    Creatinine, Ser 1.61 (*) 0.50  - 1.10 mg/dL    Calcium 09.6 (*) 8.4 - 10.5 mg/dL    GFR calc non Af Amer 46 (*) >90 mL/min    GFR calc Af Amer 53 (*) >90 mL/min     Physical Findings: AIMS: Facial and Oral Movements Muscles of Facial Expression:  (Pt. in bed,eyes closed, no abnormal movement noted.) Lips and Perioral Area: None, normal Jaw: None, normal Tongue: None, normal,Extremity Movements Upper (arms, wrists, hands, fingers): None, normal Lower (legs, knees, ankles, toes): None, normal, Trunk Movements Neck, shoulders, hips: None, normal, Overall Severity Severity of abnormal movements (highest score from questions above): None, normal Patient's awareness of abnormal movements (rate only patient's report): No Awareness, Dental Status Current problems with teeth and/or dentures?: No Does patient usually wear dentures?: No  CIWA:    COWS:     Treatment Plan Summary: Daily contact with patient to assess and evaluate symptoms and progress in treatment Medication management  Plan: 1. Will continue  Latuda to 80mg  daily with supper for delusions and  psychosis 2. Decrease  Trileptal to 300mg  po twice  daily  for mood stabilization. 3. Continue Cogenting 1mg  daily to prevent EPS.  Medical Decision Making Problem Points:  Established problem, worsening (2), Review of last therapy session (1), Review of psycho-social stressors (1) and Self-limited or minor (1) Data Points:  Order Aims Assessment (2) Review of medication regiment & side effects (2) Review of new medications or change in dosage (2)  I certify that inpatient services furnished can reasonably be expected to improve the patient's condition.   Shaconda Hajduk,MD 02/08/2012, 11:17 AM

## 2012-02-08 NOTE — ED Notes (Signed)
[id number]<AV> Expected date:<BR> Expected time:<BR> Means of arrival:<BR> Comments:<BR> EMS patient/Fall/Laceration

## 2012-02-08 NOTE — Progress Notes (Signed)
Psychoeducational Group Note  Date:  02/08/2012 Time:  2000  Group Topic/Focus:  Wrap-Up Group:   The focus of this group is to help patients review their daily goal of treatment and discuss progress on daily workbooks.  Participation Level:  Did Not Attend  Participation Quality:    Affect:    Cognitive:    Insight:    Engagement in Group:    Additional Comments:  Pt didn't attended wrap-up group this evening.   [name] A 02/08/2012, 2:04 AM

## 2012-02-08 NOTE — Progress Notes (Signed)
1:1 note- Pt resting in room, denies SI/HI/AVH, pt ate 15% of dinner, pt attends groups.  Offered pt an ensure, pt refused but did drink some water, encouraged pt to continue with treatment plan, attend all group activities and to increase fluid intake, pt receptive, calm and cooperative, no complaints at this time, will continue 1 to 1 and q15 min checks for safety.

## 2012-02-08 NOTE — Progress Notes (Signed)
Patient ID: [name], female   DOB: 03-Jul-1945, 66 y.o.   MRN: [id number] 02-08-12 @ 1000 nursing 1:1 note: D: pt has been in the bed or  In her room most of the am. She has been sleeping intermittently.  She complained of being dizzy and had no complains of pain.  She has been anxious. A: her 1:1 was renewed by the md. She was encouraged to eat and ate 10% of her breakfast and has been sipping her fluids. She is getting a supplement. She is also being helped while ambulating and going to the bathroom. R: she has been cooperative/appropriate. She stated she feels safe. 1:1 continues. Staff will monitor.

## 2012-02-08 NOTE — ED Notes (Signed)
Pt in to ED via EMS from [facility] per EMS, pt was sitting on side of bed and fell forward and hit head, laceration above left eyebrow noted, denies LOC, denies neck or back pain. Pt was taken off lithium about a week ago after being on it for 27 years, and was then started back on this two days ago, but has been c/o dizziness that staff has associated with this medication changes. Staff stated no changes in patient mobility after fall, no changes in mental status. Pt to return to [facility] after being medically cleared.

## 2012-02-08 NOTE — Progress Notes (Signed)
Patient ID: [name], female   DOB: 1945-07-16, 66 y.o.   MRN: [id number] 1:1 notes  02/08/2012 @ 0600 D: Pt helped to the bathroom. Pt given apple sauce. Patient lying n bed awake. Respiration regular and unlabored. No sign of distress noted at this time A:Medication administered as prescribed 1:1 observation for safety R: Patient is safe. 1:1 continues

## 2012-02-08 NOTE — Progress Notes (Signed)
Patient ID: [name], female   DOB: Nov 30, 1945, 66 y.o.   MRN: [id number] 02-08-12 1:1 note: D: pt ate 3 % of her lunch and drank about 30 ml of fluid. She went to the br x1 to urinate. A: RN continues to encourage pt and is administering her supplements. She likes vanilla ensure. She has gone to groups and participated. R: she continue to deny any SI/HI/AV.  RN will monitor and Q 15 min ck's continue.

## 2012-02-08 NOTE — Clinical Social Work Note (Signed)
[facility] LCSW Group Therapy   Type of Therapy:  Group Therapy  Participation Level:  Active  Participation Quality:  Appropriate and Sharing  Affect:  Depressed  Cognitive:  Alert and Oriented  Insight:  Limited  Engagement in Therapy:  Improving  Modes of Intervention:  Exploration, Rapport Building, Socialization and Support  Summary of Progress/Problems:  Pt shared that her daily activities can often help her have a positive attitude and described a habit of always doing what needed to be done around the house before going out.  Patient shared that an ideal day is spent in the car with her husband as they like to take day trips.  "[place]."  [name] 02/08/2012 3:30 PM

## 2012-02-08 NOTE — Clinical Social Work Note (Signed)
[facility] LCSW Group Therapy    Type of Therapy:  Discharge Planning  Participation Level:  Did Not Attend   Clide Dales 02/08/2012, 10:04 AM

## 2012-02-08 NOTE — Progress Notes (Signed)
Patient ID: [name], female   DOB: 09-15-1945, 66 y.o.   MRN: [id number]  Nursing 1:1 note D:Pt observed  Sitting in bed. RR even and unlabored. No distress noted. Pt states appetite is better.   A: 1:1 observation continues for safety. Medication administered  R: pt remains safe

## 2012-02-08 NOTE — Progress Notes (Signed)
Patient ID: [name], female   DOB: 12/17/1945, 66 y.o.   MRN: [id number] 1:1 notes  02/08/2012 @ 0200 D: Patient woke up restless. Respiration regular and unlabored. No sign of distress noted at this time A: Medication administered for sleep. 1:1 observation for safety R: Patient in bed with eyes closed 1:1 continues

## 2012-02-09 ENCOUNTER — Inpatient Hospital Stay (HOSPITAL_COMMUNITY)

## 2012-02-09 DIAGNOSIS — I951 Orthostatic hypotension: Secondary | ICD-10-CM | POA: Diagnosis not present

## 2012-02-09 MED ORDER — TETANUS-DIPHTH-ACELL PERTUSSIS 5-2.5-18.5 LF-MCG/0.5 IM SUSP
0.5000 mL | Freq: Once | INTRAMUSCULAR | Status: AC
  Administered 2012-02-09: 0.5 mL via INTRAMUSCULAR
  Filled 2012-02-09: qty 0.5

## 2012-02-09 NOTE — Progress Notes (Signed)
Psychoeducational Group Note  Date:  02/09/2012 Time:0900am  Group Topic/Focus:  Identifying Needs:   The focus of this group is to help patients identify their personal needs that have been historically problematic and identify healthy behaviors to address their needs.  Participation Level:  Did Not Attend  Participation Quality:    Affect:   Cognitive:  Insight:  Engagement in Group:  Additional Comments:  Inventory group   [name] 02/09/2012,9:29 AM

## 2012-02-09 NOTE — Progress Notes (Signed)
Patient ID: [name], female   DOB: 01/10/1946, 65 y.o.   MRN: [id number] 02-09-12@ 1800 nursing 1:1 note: D: pt continues to eat very little. She continues to be a fall risk. She states the smell of food makes her nauseated. A: staff continues to encourage her to eat and drink. She is taking in fluids 1000cc and has eaten some fruit, nutri-grain bar. She is a fall risk and is using a wheelchair R: she is compliant with following directions of staff to keep her safe. Her 1:1 continue.

## 2012-02-09 NOTE — ED Notes (Signed)
Report called to [facility], pt will return via [facility].

## 2012-02-09 NOTE — Progress Notes (Signed)
[facility] MD Progress Note  02/09/2012 5:09 PM [name]  MRN:  [id number] Subjective:  "I'm doin' ok." [name] is sitting upright in bed, and has a laceration with sutures above her Left eye where she fell last night. She denies any pain today. RN states she is eating several small meals aday. Diagnosis:  Bipolar 1 most recent episode manic  ADL's:  Intact  Sleep: Fair  Appetite:  Poor  Suicidal Ideation:  denies Homicidal Ideation:  denies AEB (as evidenced by): patient's verbal response, affect, and written response on daily self inventory.  Psychiatric Specialty Exam: Review of Systems  Constitutional: Negative.  Negative for fever, chills, weight loss, malaise/fatigue and diaphoresis.  HENT: Negative for congestion and sore throat.   Eyes: Negative for blurred vision, double vision and photophobia.  Respiratory: Negative for cough, shortness of breath and wheezing.   Cardiovascular: Negative for chest pain, palpitations and PND.  Gastrointestinal: Negative for heartburn, nausea, vomiting, abdominal pain, diarrhea and constipation.  Musculoskeletal: Negative for myalgias, joint pain and falls.  Neurological: Negative for dizziness, tingling, tremors, sensory change, speech change, focal weakness, seizures, loss of consciousness, weakness and headaches.  Endo/Heme/Allergies: Negative for polydipsia. Does not bruise/bleed easily.  Psychiatric/Behavioral: Negative for depression, suicidal ideas, hallucinations, memory loss and substance abuse. The patient is not nervous/anxious and does not have insomnia.     Blood pressure 104/69, pulse 73, temperature 98.3 F (36.8 C), temperature source Oral, resp. rate 18, height 5\' 1"  (1.549 m), weight 55.339 kg (122 lb), SpO2 96.00%.Body mass index is 23.05 kg/(m^2).  General Appearance: Disheveled with large bruise over left eye  Eye Contact::  Good  Speech:  Clear and Coherent  Volume:  Normal  Mood:  Euthymic  Affect:  Congruent    Thought Process:  Goal Directed  Orientation:  NA  Thought Content:  Negative  Suicidal Thoughts:  No  Homicidal Thoughts:  No  Memory:  Immediate;   Fair  Judgement:  Impaired  Insight:  Lacking  Psychomotor Activity:  Normal  Concentration:  Fair  Recall:  Fair  Akathisia:  No  Handed:    AIMS (if indicated):     Assets:  Communication Skills Desire for Improvement Housing Physical Health Social Support  Sleep:  Number of Hours: 3    Current Medications: Current Facility-Administered Medications  Medication Dose Route Frequency Provider Last Rate Last Dose  . acetaminophen (TYLENOL) tablet 650 mg  650 mg Oral Q6H PRN Verne Spurr, PA-C      . alum & mag hydroxide-simeth (MAALOX/MYLANTA) 200-200-20 MG/5ML suspension 30 mL  30 mL Oral Q4H PRN Verne Spurr, PA-C      . benztropine (COGENTIN) tablet 1 mg  1 mg Oral Daily Mojeed Akintayo   1 mg at 02/09/12 0820  . feeding supplement (ENSURE COMPLETE) liquid 237 mL  237 mL Oral BID BM Jorje Guild, PA-C   237 mL at 02/09/12 1424  . levothyroxine (SYNTHROID, LEVOTHROID) tablet 125 mcg  125 mcg Oral QAC breakfast Verne Spurr, PA-C   125 mcg at 02/09/12 4098  . lurasidone (LATUDA) tablet 80 mg  80 mg Oral Q supper Mojeed Akintayo   80 mg at 02/09/12 1626  . magnesium hydroxide (MILK OF MAGNESIA) suspension 30 mL  30 mL Oral Daily PRN Verne Spurr, PA-C   30 mL at 02/06/12 2201  . ondansetron (ZOFRAN-ODT) disintegrating tablet 4 mg  4 mg Oral Q4H PRN Mike Craze, MD   4 mg at 02/06/12 1028  . Oxcarbazepine (TRILEPTAL)  tablet 300 mg  300 mg Oral BID Mojeed Akintayo   300 mg at 02/09/12 1627  . Oxcarbazepine (TRILEPTAL) tablet 600 mg  600 mg Oral QHS Mojeed Akintayo   600 mg at 02/08/12 2209  . traZODone (DESYREL) tablet 50 mg  50 mg Oral QHS PRN,MR X 1 Kerry Hough, PA   50 mg at 02/08/12 0153    Lab Results: No results found for this or any previous visit (from the past 48 hour(s)).  Physical Findings: AIMS: Facial and Oral  Movements Muscles of Facial Expression:  (Pt. in bed,eyes closed, no abnormal movement noted.) Lips and Perioral Area: None, normal Jaw: None, normal Tongue: None, normal,Extremity Movements Upper (arms, wrists, hands, fingers): None, normal Lower (legs, knees, ankles, toes): None, normal, Trunk Movements Neck, shoulders, hips: None, normal, Overall Severity Severity of abnormal movements (highest score from questions above): None, normal Patient's awareness of abnormal movements (rate only patient's report): No Awareness, Dental Status Current problems with teeth and/or dentures?: No Does patient usually wear dentures?: No  CIWA:    COWS:     Treatment Plan Summary: Daily contact with patient to assess and evaluate symptoms and progress in treatment Medication management  Plan: 1. Will continue the 1:1 DUE to the fall risk. 2. Patient is much less pressured since the last time I saw her, so improvements are happening. 3. Would consider an IM consult if she continues to have hypotension. 4. Will continue to follow.  Medical Decision Making Problem Points:  Established problem, stable/improving (1) Data Points:  Review or order medicine tests (1)  I certify that inpatient services furnished can reasonably be expected to improve the patient's condition.  Rona Ravens. Chanelle Hodsdon PAC 02/09/2012, 5:09 PM

## 2012-02-09 NOTE — Progress Notes (Addendum)
Patient ID: [name], female   DOB: 06-09-1945, 66 y.o.   MRN: [id number] 02-09-12 1:1 nursing note: D: pt has not had a b/m nor urinated this am. Her appetite is very poor and she was given her supplement per order.she drank a minimal amt of the supplement. She ate no breakfast. She had no complaints of pain. A: RN cleansed her would over her left eye. Assessed her pain level, which she denied. Ambulated her down the hall and back to her room. Her respirations have been normal rate and rhythm. R:she has been receptive to care from staff, taken her medication and been cooperative. She continues to be weak and dizzy and is a fall risk.  The 1:1 continues.

## 2012-02-09 NOTE — Progress Notes (Signed)
.  1:1  Pt at [facility]

## 2012-02-09 NOTE — Clinical Social Work Psychosocial (Signed)
[facility] Group Notes: (Clinical Social Work)   02/09/2012      Type of Therapy:  Group Therapy   Participation Level:  Did Not Attend    Ambrose Mantle, LCSW 02/09/2012, 12:44 PM

## 2012-02-09 NOTE — Progress Notes (Signed)
DAR- Update note-1:1 note  D:Pt returned from [facility] at 0445 to room. Pt alert and oriented but complained of still feeling "woosy". Pt becoming hyper verbal. Pt drinking fluids. Pt lying in bed awake talking to MHT.RR even and unlabored. No distress noted. .  A: Pt was offered support and encouragement.   Q 15 minute checks were done for safety. Bp taken : 113/73 sitting and 96/67 standing. Pt offered water to drink. 1:1 observation continues for safety   R:  Pt receptive to treatment and safety maintained on unit.

## 2012-02-09 NOTE — ED Provider Notes (Signed)
History     CSN: [id number]  Arrival date & time 02/04/12  2346   First MD Initiated Contact with Patient 02/05/12 0039      Chief Complaint  Patient presents with  . Fall    (Consider location/radiation/quality/duration/timing/severity/associated sxs/prior treatment) HPI History provided by patient and her husband bedside. Currently an inpatient at behavioral health has history of orthostatic hypotension and for the last week has been getting nightly medications that make her very sleepy. Tonight after taking medications, trying to sit up from her bed and fell forward striking her for head. Questionable LOC. No seizure activity. No neck pain. No other injury. Sustained laceration and bleeding controlled prior to arrival. No chest pain or shortness of breath. No nausea vomiting diarrhea. Patient denies any other pain or trauma History reviewed. No pertinent past medical history.  History reviewed. No pertinent past surgical history.  History reviewed. No pertinent family history.  History  Substance Use Topics  . Smoking status: Former Smoker -- 1.0 packs/day for 30 years    Types: Cigarettes  . Smokeless tobacco: Not on file  . Alcohol Use: Not on file    OB History    Grav Para Term Preterm Abortions TAB SAB Ect Mult Living                  Review of Systems  Constitutional: Negative for fever and chills.  HENT: Negative for neck pain and neck stiffness.   Eyes: Negative for pain.  Respiratory: Negative for shortness of breath.   Cardiovascular: Negative for chest pain.  Gastrointestinal: Negative for vomiting and abdominal pain.  Genitourinary: Negative for dysuria.  Musculoskeletal: Negative for back pain.  Skin: Positive for wound. Negative for rash.  Neurological: Negative for headaches.  All other systems reviewed and are negative.    Allergies  Sulfa antibiotics  Home Medications   Current Outpatient Rx  Name  Route  Sig  Dispense  Refill  .  ESTROGENS CONJUGATED 0.625 MG PO TABS   Oral   Take 0.625 mg by mouth daily.          Marland Kitchen LEVOTHYROXINE SODIUM 125 MCG PO TABS   Oral   Take 125 mcg by mouth daily.         Marland Kitchen LITHIUM CARBONATE ER 450 MG PO TBCR   Oral   Take 450 mg by mouth 2 (two) times daily.         . TRAZODONE HCL 100 MG PO TABS   Oral   Take 100 mg by mouth at bedtime.           BP 137/77  Pulse 68  Temp 97.6 F (36.4 C) (Oral)  Resp 16  Ht 5\' 1"  (1.549 m)  Wt 122 lb (55.339 kg)  BMI 23.05 kg/m2  SpO2 96%  Physical Exam  Constitutional: She is oriented to person, place, and time. She appears well-developed and well-nourished.  HENT:  Head: Normocephalic.       Approximately 3 cm T-shaped laceration over left eyebrow. Full-thickness. Hemostatic. No underlying bony deformity. No trismus. No midface instability. No dental tenderness.  Eyes: Conjunctivae normal and EOM are normal. Pupils are equal, round, and reactive to light.  Neck: Trachea normal. No thyromegaly present.       No midline tenderness or deformity.  Cardiovascular: Normal rate, regular rhythm, S1 normal, S2 normal and normal pulses.     No systolic murmur is present   No diastolic murmur is present  Pulses:  Radial pulses are 2+ on the right side, and 2+ on the left side.  Pulmonary/Chest: Effort normal and breath sounds normal. She has no wheezes. She has no rhonchi. She has no rales. She exhibits no tenderness.  Abdominal: Soft. Normal appearance and bowel sounds are normal. There is no tenderness. There is no CVA tenderness and negative Murphy's sign.  Musculoskeletal:       BLE:s Calves nontender, no cords or erythema, negative Homans sign  Neurological: She is alert and oriented to person, place, and time. She has normal strength. No cranial nerve deficit or sensory deficit. GCS eye subscore is 4. GCS verbal subscore is 5. GCS motor subscore is 6.  Skin: Skin is warm and dry. No rash noted. She is not diaphoretic.    Psychiatric: Her speech is normal.       Cooperative and appropriate    ED Course  LACERATION REPAIR Date/Time: 02/09/2012 3:58 AM Performed by: Sunnie Nielsen Authorized by: Sunnie Nielsen Consent: Verbal consent obtained. Risks and benefits: risks, benefits and alternatives were discussed Consent given by: patient Patient understanding: patient states understanding of the procedure being performed Patient consent: the patient's understanding of the procedure matches consent given Procedure consent: procedure consent matches procedure scheduled Required items: required blood products, implants, devices, and special equipment available Patient identity confirmed: verbally with patient Time out: Immediately prior to procedure a "time out" was called to verify the correct patient, procedure, equipment, support staff and site/side marked as required. Body area: head/neck Location details: left eyebrow Laceration length: 3 cm Vascular damage: no Anesthesia: local infiltration Local anesthetic: lidocaine 1% without epinephrine Anesthetic total: 3 ml Preparation: Patient was prepped and draped in the usual sterile fashion. Irrigation solution: saline Irrigation method: syringe Amount of cleaning: extensive Skin closure: 5-0 nylon Number of sutures: 6 Technique: simple Approximation: close Dressing: antibiotic ointment Patient tolerance: Patient tolerated the procedure well with no immediate complications.   (including critical care time)  Ct Head Wo Contrast  02/09/2012  *RADIOLOGY REPORT*  Clinical Data:  Status post fall, with injury to the left supraorbital region.  Concern for cervical spine injury.  CT HEAD WITHOUT CONTRAST AND CT CERVICAL SPINE WITHOUT CONTRAST  Technique:  Multidetector CT imaging of the head and cervical spine was performed following the standard protocol without intravenous contrast.  Multiplanar CT image reconstructions of the cervical spine were also  generated.  Comparison: CTA of the head performed 01/30/2012  CT HEAD  Findings: There is no evidence of acute infarction, mass lesion, or intra- or extra-axial hemorrhage on CT.  Scattered periventricular and subcortical white matter change likely reflects small vessel ischemic microangiopathy.  The posterior fossa, including the cerebellum, brainstem and fourth ventricle, is within normal limits.  The third and lateral ventricles, and basal ganglia are unremarkable in appearance.  The cerebral hemispheres demonstrate grossly normal gray-white differentiation.  No mass effect or midline shift is seen.  There is no evidence of fracture; visualized osseous structures are unremarkable in appearance.  The orbits are within normal limits. The paranasal sinuses and mastoid air cells are well-aerated.  A soft tissue laceration is noted just superior to the left orbit.  IMPRESSION:  1.  No evidence of traumatic intracranial injury or fracture. 2.  Soft tissue laceration just superior to the left orbit. 3.  Scattered small vessel ischemic microangiopathy.  CT CERVICAL SPINE  Findings: There is no evidence of fracture or subluxation. Vertebral bodies demonstrate normal height and alignment.  There is mild narrowing of  the intervertebral disc spaces at C4-C5 and C5- C6, with associated small posterior disc osteophyte complexes. Prevertebral soft tissues are within normal limits.  The visualized neural foramina are grossly unremarkable.  The thyroid gland is markedly diminutive.  The visualized lung apices are clear.  No significant soft tissue abnormalities are seen.  IMPRESSION:  1.  No evidence of fracture or subluxation along the cervical spine. 2.  Minimal degenerative change at the mid cervical spine.   Original Report Authenticated By: Tonia Ghent, M.D.    Ct Cervical Spine Wo Contrast  02/09/2012  *RADIOLOGY REPORT*  Clinical Data:  Status post fall, with injury to the left supraorbital region.  Concern for  cervical spine injury.  CT HEAD WITHOUT CONTRAST AND CT CERVICAL SPINE WITHOUT CONTRAST  Technique:  Multidetector CT imaging of the head and cervical spine was performed following the standard protocol without intravenous contrast.  Multiplanar CT image reconstructions of the cervical spine were also generated.  Comparison: CTA of the head performed 01/30/2012  CT HEAD  Findings: There is no evidence of acute infarction, mass lesion, or intra- or extra-axial hemorrhage on CT.  Scattered periventricular and subcortical white matter change likely reflects small vessel ischemic microangiopathy.  The posterior fossa, including the cerebellum, brainstem and fourth ventricle, is within normal limits.  The third and lateral ventricles, and basal ganglia are unremarkable in appearance.  The cerebral hemispheres demonstrate grossly normal gray-white differentiation.  No mass effect or midline shift is seen.  There is no evidence of fracture; visualized osseous structures are unremarkable in appearance.  The orbits are within normal limits. The paranasal sinuses and mastoid air cells are well-aerated.  A soft tissue laceration is noted just superior to the left orbit.  IMPRESSION:  1.  No evidence of traumatic intracranial injury or fracture. 2.  Soft tissue laceration just superior to the left orbit. 3.  Scattered small vessel ischemic microangiopathy.  CT CERVICAL SPINE  Findings: There is no evidence of fracture or subluxation. Vertebral bodies demonstrate normal height and alignment.  There is mild narrowing of the intervertebral disc spaces at C4-C5 and C5- C6, with associated small posterior disc osteophyte complexes. Prevertebral soft tissues are within normal limits.  The visualized neural foramina are grossly unremarkable.  The thyroid gland is markedly diminutive.  The visualized lung apices are clear.  No significant soft tissue abnormalities are seen.  IMPRESSION:  1.  No evidence of fracture or subluxation along  the cervical spine. 2.  Minimal degenerative change at the mid cervical spine.   Original Report Authenticated By: Tonia Ghent, M.D.      1. Fall   2. Orthostatic hypotension   3. Bipolar 1 disorder, manic, moderate   4. Bipolar affective disorder, manic, severe, with psychotic behavior     Evaluated with CT scans reviewed as above. LAC repair. Tetanus updated.  MDM   Laceration after fall. History of orthostatic hypertension. CT scans reviewed no fracture or intracranial bleed. Vital signs and nursing notes reviewed.  Patient stable for discharge back to behavioral health       Sunnie Nielsen, MD 02/09/12 [phone]

## 2012-02-09 NOTE — ED Notes (Signed)
[facility] at bedside to transport patient 

## 2012-02-09 NOTE — Progress Notes (Addendum)
Patient ID: [name], female   DOB: Mar 20, 1945, 66 y.o.   MRN: [id number] 02-09-12 @1400   1:1 nursing note: D: pt is visible in the dayroom with the sitter. A: she was encouraged to come out of her room. She has been encouraged to snack throughout the shift, because large meal are not agreeing with her. Pt was ambulated up and down the hall  R: she has been receptive to suggestion and acting appropriately. She remains weak. 1:1 continues.

## 2012-02-09 NOTE — Progress Notes (Signed)
Patient ID: [name], female   DOB: 05-10-1945, 66 y.o.   MRN: [id number]  D: Pt fell while sitting on the side of her bed, MHT witness fall. EMS was notified. Pt transfered to [facility] long ED. Pt sustained cut over L-eye, small cut on L-elbow and L-wrist.    A: Pressure applied to L-eye area. Pt consoled until EMS arrived. Bp taken and  Was 84/45 by dynamap at 2315 Pulse 64. MD and Pt husband notified of the fall.  R: Pt alert oriented x3 , and responding appropriately to questions.

## 2012-02-10 ENCOUNTER — Observation Stay (HOSPITAL_COMMUNITY)
Admission: EM | Admit: 2012-02-10 | Discharge: 2012-02-13 | Disposition: A | Discharge status: Home / Self Care | Attending: Internal Medicine | Admitting: Internal Medicine

## 2012-02-10 ENCOUNTER — Emergency Department (HOSPITAL_COMMUNITY)

## 2012-02-10 ENCOUNTER — Encounter (HOSPITAL_COMMUNITY): Payer: Self-pay

## 2012-02-10 ENCOUNTER — Observation Stay (HOSPITAL_COMMUNITY)

## 2012-02-10 DIAGNOSIS — W19XXXA Unspecified fall, initial encounter: Secondary | ICD-10-CM | POA: Insufficient documentation

## 2012-02-10 DIAGNOSIS — E039 Hypothyroidism, unspecified: Secondary | ICD-10-CM | POA: Diagnosis present

## 2012-02-10 DIAGNOSIS — I639 Cerebral infarction, unspecified: Secondary | ICD-10-CM | POA: Diagnosis present

## 2012-02-10 DIAGNOSIS — I6350 Cerebral infarction due to unspecified occlusion or stenosis of unspecified cerebral artery: Principal | ICD-10-CM | POA: Insufficient documentation

## 2012-02-10 DIAGNOSIS — F3110 Bipolar disorder, current episode manic without psychotic features, unspecified: Secondary | ICD-10-CM | POA: Insufficient documentation

## 2012-02-10 DIAGNOSIS — S0180XA Unspecified open wound of other part of head, initial encounter: Secondary | ICD-10-CM | POA: Insufficient documentation

## 2012-02-10 DIAGNOSIS — E785 Hyperlipidemia, unspecified: Secondary | ICD-10-CM | POA: Insufficient documentation

## 2012-02-10 DIAGNOSIS — R4701 Aphasia: Secondary | ICD-10-CM | POA: Insufficient documentation

## 2012-02-10 DIAGNOSIS — F3112 Bipolar disorder, current episode manic without psychotic features, moderate: Secondary | ICD-10-CM | POA: Insufficient documentation

## 2012-02-10 DIAGNOSIS — E870 Hyperosmolality and hypernatremia: Secondary | ICD-10-CM | POA: Insufficient documentation

## 2012-02-10 DIAGNOSIS — E878 Other disorders of electrolyte and fluid balance, not elsewhere classified: Secondary | ICD-10-CM | POA: Insufficient documentation

## 2012-02-10 DIAGNOSIS — Z9181 History of falling: Secondary | ICD-10-CM | POA: Insufficient documentation

## 2012-02-10 DIAGNOSIS — I5032 Chronic diastolic (congestive) heart failure: Secondary | ICD-10-CM | POA: Diagnosis present

## 2012-02-10 DIAGNOSIS — R270 Ataxia, unspecified: Secondary | ICD-10-CM

## 2012-02-10 DIAGNOSIS — E871 Hypo-osmolality and hyponatremia: Secondary | ICD-10-CM | POA: Diagnosis present

## 2012-02-10 DIAGNOSIS — R269 Unspecified abnormalities of gait and mobility: Secondary | ICD-10-CM

## 2012-02-10 DIAGNOSIS — F319 Bipolar disorder, unspecified: Secondary | ICD-10-CM

## 2012-02-10 DIAGNOSIS — R279 Unspecified lack of coordination: Secondary | ICD-10-CM

## 2012-02-10 DIAGNOSIS — I951 Orthostatic hypotension: Secondary | ICD-10-CM | POA: Diagnosis present

## 2012-02-10 HISTORY — DX: Cerebral infarction, unspecified: I63.9

## 2012-02-10 LAB — CBC WITH DIFFERENTIAL/PLATELET
Basophils Absolute: 0 10*3/uL (ref 0.0–0.1)
Basophils Relative: 0 % (ref 0–1)
Eosinophils Absolute: 0.1 10*3/uL (ref 0.0–0.7)
Hemoglobin: 11.9 g/dL — ABNORMAL LOW (ref 12.0–15.0)
MCH: 31 pg (ref 26.0–34.0)
MCHC: 31.3 g/dL (ref 30.0–36.0)
Monocytes Absolute: 0.5 10*3/uL (ref 0.1–1.0)
Monocytes Relative: 8 % (ref 3–12)
Neutro Abs: 5.7 10*3/uL (ref 1.7–7.7)
Neutrophils Relative %: 79 % — ABNORMAL HIGH (ref 43–77)
RDW: 12.9 % (ref 11.5–15.5)

## 2012-02-10 LAB — BASIC METABOLIC PANEL
BUN: 15 mg/dL (ref 6–23)
Creatinine, Ser: 1.23 mg/dL — ABNORMAL HIGH (ref 0.50–1.10)
GFR calc Af Amer: 53 mL/min — ABNORMAL LOW (ref >90)
GFR calc non Af Amer: 45 mL/min — ABNORMAL LOW (ref >90)
Glucose, Bld: 120 mg/dL — ABNORMAL HIGH (ref 70–99)
Potassium: 3.3 mEq/L — ABNORMAL LOW (ref 3.5–5.1)

## 2012-02-10 LAB — TSH: TSH: 0.063 u[IU]/mL — ABNORMAL LOW (ref 0.350–4.500)

## 2012-02-10 MED ORDER — SODIUM CHLORIDE 0.9 % IV SOLN
Freq: Once | INTRAVENOUS | Status: AC
  Administered 2012-02-10: 75 mL/h via INTRAVENOUS

## 2012-02-10 MED ORDER — ASPIRIN EC 81 MG PO TBEC
81.0000 mg | DELAYED_RELEASE_TABLET | Freq: Every day | ORAL | Status: DC
  Administered 2012-02-10 – 2012-02-11 (×2): 81 mg via ORAL
  Filled 2012-02-10 (×2): qty 1

## 2012-02-10 MED ORDER — CLOPIDOGREL BISULFATE 75 MG PO TABS
75.0000 mg | ORAL_TABLET | Freq: Every day | ORAL | Status: DC
  Administered 2012-02-11 – 2012-02-12 (×2): 75 mg via ORAL
  Filled 2012-02-10 (×3): qty 1

## 2012-02-10 MED ORDER — POTASSIUM CHLORIDE CRYS ER 20 MEQ PO TBCR
20.0000 meq | EXTENDED_RELEASE_TABLET | Freq: Two times a day (BID) | ORAL | Status: AC
  Administered 2012-02-10 (×2): 20 meq via ORAL
  Filled 2012-02-10 (×3): qty 1

## 2012-02-10 MED ORDER — LITHIUM CARBONATE ER 450 MG PO TBCR
450.0000 mg | EXTENDED_RELEASE_TABLET | Freq: Two times a day (BID) | ORAL | Status: DC
  Administered 2012-02-10 – 2012-02-11 (×2): 450 mg via ORAL
  Filled 2012-02-10 (×3): qty 1

## 2012-02-10 MED ORDER — LEVOTHYROXINE SODIUM 125 MCG PO TABS
125.0000 ug | ORAL_TABLET | Freq: Every day | ORAL | Status: DC
  Administered 2012-02-10 – 2012-02-11 (×2): 125 ug via ORAL
  Filled 2012-02-10 (×3): qty 1

## 2012-02-10 MED ORDER — POTASSIUM CHLORIDE IN NACL 20-0.9 MEQ/L-% IV SOLN
INTRAVENOUS | Status: DC
  Administered 2012-02-10 – 2012-02-11 (×2): via INTRAVENOUS
  Filled 2012-02-10 (×4): qty 1000

## 2012-02-10 MED ORDER — ENOXAPARIN SODIUM 40 MG/0.4ML ~~LOC~~ SOLN
40.0000 mg | SUBCUTANEOUS | Status: DC
  Administered 2012-02-10 – 2012-02-12 (×3): 40 mg via SUBCUTANEOUS
  Filled 2012-02-10 (×4): qty 0.4

## 2012-02-10 MED ORDER — TRAZODONE HCL 50 MG PO TABS
50.0000 mg | ORAL_TABLET | Freq: Every day | ORAL | Status: DC
  Administered 2012-02-10 – 2012-02-12 (×3): 50 mg via ORAL
  Filled 2012-02-10 (×4): qty 1

## 2012-02-10 MED ORDER — OXCARBAZEPINE 300 MG PO TABS
600.0000 mg | ORAL_TABLET | Freq: Every day | ORAL | Status: DC
  Administered 2012-02-10 – 2012-02-12 (×3): 600 mg via ORAL
  Filled 2012-02-10 (×4): qty 2

## 2012-02-10 MED ORDER — SENNOSIDES-DOCUSATE SODIUM 8.6-50 MG PO TABS
1.0000 | ORAL_TABLET | Freq: Every evening | ORAL | Status: DC | PRN
  Administered 2012-02-11 – 2012-02-12 (×2): 1 via ORAL
  Filled 2012-02-10 (×2): qty 1

## 2012-02-10 MED ORDER — LURASIDONE HCL 80 MG PO TABS
80.0000 mg | ORAL_TABLET | Freq: Every day | ORAL | Status: DC
  Administered 2012-02-10 – 2012-02-12 (×3): 80 mg via ORAL
  Filled 2012-02-10 (×4): qty 1

## 2012-02-10 MED ORDER — SODIUM CHLORIDE 0.9 % IV BOLUS (SEPSIS)
500.0000 mL | Freq: Once | INTRAVENOUS | Status: AC
  Administered 2012-02-10: 500 mL via INTRAVENOUS

## 2012-02-10 NOTE — ED Provider Notes (Addendum)
Signed out by Dr Bebe Shaggy that patient is a patient at [facility], had recent falls, bumped head last night post coughing, and that MRI is pending.  If MRI normal, do d/c back to [facility] for continued psych tx.  Mri results noted.  Given recent balance issues, mri findings, neurology consulted to eval and rec either neurologically stable for d/c back to [facility], or whether requires further neurology workup - they will see in ed.   Suzi Roots, MD 02/10/12 1023  Neurology has seen, recommends inpt admission/obs for cva workup. [facility] hospitalist service called to admit.     Suzi Roots, MD 02/10/12 1147  [facility] states admit to tele, team 6.   Suzi Roots, MD 02/10/12 [phone]

## 2012-02-10 NOTE — ED Notes (Signed)
Pt. Here from [facility], reported onset slurred speech at 2300. Alert and oriented x4, equal bil.

## 2012-02-10 NOTE — ED Notes (Signed)
Pt. Reports slurred speech and nausea off and on for x2 weeks. Alert and oriented x4. Seen yesterday at [facility] for fall, CT clear.

## 2012-02-10 NOTE — ED Notes (Signed)
Patient transported to MRI 

## 2012-02-10 NOTE — ED Notes (Signed)
Pt. Family and [facility] staff updated on plan of care.

## 2012-02-10 NOTE — ED Notes (Signed)
Talked with charge nurse at [facility]. Received more information about patient's status prior to arrival and talked about appropriate plan of care. Will update physician

## 2012-02-10 NOTE — Evaluation (Signed)
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 Pt currently residing at [facility] secondary to bipolar episode. Will benefit from acute OT to address below problem list.  Recommending 24/7 supervision.    OT Assessment  Patient needs continued OT Services    Follow Up Recommendations  No OT follow up;Supervision/Assistance - 24 hour    Barriers to Discharge      Equipment Recommendations  None recommended by OT    Recommendations for Other Services    Frequency  Min 2X/week    Precautions / Restrictions Precautions Precautions: Fall Restrictions Weight Bearing Restrictions: No   Pertinent Vitals/Pain See vitals    ADL  Eating/Feeding: Performed;Independent Where Assessed - Eating/Feeding: Edge of bed Grooming: Performed;Wash/dry hands;Min guard Where Assessed - Grooming: Unsupported standing Lower Body Dressing: Performed;Min guard Where Assessed - Lower Body Dressing: Unsupported sit to stand Toilet Transfer: Performed;Minimal assistance Toilet Transfer Method: Sit to Barista: Regular height toilet;Grab bars Toileting - Clothing Manipulation and Hygiene: Performed;Min guard Where Assessed - Engineer, mining and Hygiene: Sit to stand from 3-in-1 or toilet Equipment Used: Gait belt Transfers/Ambulation Related to ADLs: min assist with HHA x1.  Occasional LOB during turns.    OT Diagnosis: Generalized weakness;Cognitive deficits  OT Problem List: Impaired balance (sitting and/or standing);Decreased cognition;Decreased strength OT Treatment Interventions: Self-care/ADL training;Therapeutic activities;Patient/family  education;Balance training;Cognitive remediation/compensation   OT Goals Acute Rehab OT Goals OT Goal Formulation: With patient Time For Goal Achievement: 02/10/12 Potential to Achieve Goals: Good ADL Goals Pt Will Perform Grooming: Independently;Standing at sink ADL Goal: Grooming - Progress: Goal set today Pt Will Transfer to Toilet: Independently;Ambulation;Comfort height toilet ADL Goal: Toilet Transfer - Progress: Goal set today Pt Will Perform Toileting - Clothing Manipulation: Independently;Sitting on 3-in-1 or toilet;Standing ADL Goal: Toileting - Clothing Manipulation - Progress: Goal set today Pt Will Perform Toileting - Hygiene: Independently;Sit to stand from 3-in-1/toilet;Standing at 3-in-1/toilet ADL Goal: Toileting - Hygiene - Progress: Goal set today Additional ADL Goal #1: Pt will perform full bathing/dressing ADLs independently. ADL Goal: Additional Goal #1 - Progress: Goal set today Miscellaneous OT Goals Miscellaneous OT Goal #1: Pt will independently locate 3 objects (as specified by therapist)  while independntly ambulating through unit.  OT Goal: Miscellaneous Goal #1 - Progress: Goal set today  Visit Information  Last OT Received On: 02/10/12 Assistance Needed: +1 PT/OT Co-Evaluation/Treatment: Yes    Subjective Data      Prior Functioning     Home Living Lives With: Spouse Available Help at Discharge: Family Type of Home: House Home Access: Stairs to enter [employer] of Steps: 3 Entrance Stairs-Rails: Right Home Layout: One level Bathroom Shower/Tub: Walk-in shower;Door [employer] Toilet: Standard Bathroom Accessibility: Yes How Accessible: Accessible via walker Home Adaptive Equipment: Grab bars in shower (has acess to cane/walker if need be) Prior Function Level of Independence: Independent Able to Take Stairs?: Yes Driving: Yes Vocation: Retired Musician: No difficulties Dominant Hand: Right           Vision/Perception Vision - Assessment Additional Comments: Pt reports she had been experiencing blurring/double vision PTA but that she is no longer experiencing any difficulty. Will continue to assess.   Cognition  Overall Cognitive Status:  Impaired Arousal/Alertness: Awake/alert Orientation Level: Disoriented to;Time Behavior During Session: Flat affect Cognition - Other Comments: Pt able to recall phone number and home address as well as correctly answer basic safety questions (recalling what to do in case of fire at home).  However, pt had difficulty accurately using calendar to identify what day it is.  Calendar says "Sat/Sun" and pt verbalizing "Today is Saturday/Sunday".    Extremity/Trunk Assessment Right Lower Extremity Assessment RLE ROM/Strength/Tone: Within functional levels RLE Sensation: WFL - Light Touch RLE Coordination: WFL - gross/fine motor Left Lower Extremity Assessment LLE ROM/Strength/Tone: Within functional levels LLE Sensation: WFL - Light Touch LLE Coordination: WFL - gross/fine motor Trunk Assessment Trunk Assessment: Normal     Mobility Bed Mobility Bed Mobility: Supine to Sit;Sitting - Scoot to Edge of Bed;Sit to Supine Supine to Sit: 4: Min guard Sitting - Scoot to [place] of Bed: 4: Min guard Sit to Supine: 4: Min guard Transfers Transfers: Sit to Stand;Stand to Sit Sit to Stand: 4: Min guard;With upper extremity assist;From bed;From toilet Stand to Sit: 4: Min guard;With upper extremity assist;To bed;To toilet Details for Transfer Assistance: Minguard for safety. No LOBS     Shoulder Instructions     Exercise     Balance Balance Balance Assessed: Yes Standardized Balance Assessment Standardized Balance Assessment: Dynamic Gait Index Dynamic Gait Index Level Surface: Mild Impairment Change in Gait Speed: Mild Impairment Gait with Horizontal Head Turns: Mild Impairment Gait with Vertical Head Turns: Normal Gait and Pivot Turn: Normal Step  Over Obstacle: Mild Impairment Step Around Obstacles: Normal   End of Session OT - End of Session Equipment Utilized During Treatment: Gait belt Activity Tolerance: Patient tolerated treatment well Patient left: in bed;with call bell/phone within reach Nurse Communication: Mobility status  GO Functional Assessment Tool Used: clinical judgment Functional Limitation: Self care Self Care Current Status (M8413): At least 1 percent but less than 20 percent impaired, limited or restricted Self Care Goal Status [phone]): 0 percent impaired, limited or restricted  02/10/2012 Cipriano Mile OTR/L Pager [phone] Office [phone]  Cipriano Mile 02/10/2012, 5:10 PM

## 2012-02-10 NOTE — Consult Note (Addendum)
Reason for Consult: Multiple Falls, abnormal MRI Referring Physician: Leeann Must  CC: Falls  History is obtained from:Patient  HPI: [name] is a 66 y.o. female with a history of repeated falls over the past few weeks to month. She states that when she is up she will walk a few steps, then get lightheaded and her legs will go out from under her. This has caused her to fall and injure herself multiple times. She also has not been eating or drinking well. She fell on 12/24 with a possible LOC, though this is unclear and also gave herself a laceration over her left eye last night. She has had some nausea and vomiting after her head injury on 12/24. She had an MRI which shows a subacute diffusion change in her right frontal area which could represent traumatic injury or subacute infarction. She had a CTA head and CT on 12/18 which was before her serious falls and this lesion is present at that time as well.   She feels like it may be improving slightly, though she still does not feel stable by herself.   She is currently admitted to behavioral health for symptoms similar to previous manic episodes and had been off of her lithium due to possible side effects.   ROS: A 14 point ROS was performed and is negative except as noted in the HPI.  PMHx: High cholesterol BPAD   Family History: brother - stroke 3 years ago  Social History: Tob: Quit smoking > 10 years ago  Exam: Current vital signs: BP 104/69  Pulse 69  Temp 98.3 F (36.8 C) (Oral)  Resp 16  SpO2 97% Vital signs in last 24 hours: Temp:  [98.1 F (36.7 C)-98.3 F (36.8 C)] 98.3 F (36.8 C) (12/29 0935) Pulse Rate:  [69-90] 69  (12/29 0935) Resp:  [15-20] 16  (12/29 0935) BP: (104-139)/(59-73) 104/69 mmHg (12/29 0935) SpO2:  [93 %-97 %] 97 % (12/29 0935) FiO2 (%):  [21 %] 21 % (12/29 0227)  General: In bed, NAD HEENT: stitched laceration over left eye.  CV: RRR Mental Status: Patient is awake, alert, oriented to  person, place, month, year(though initially gives 2014, then corrects herself), and situation. Immediate and remote memory are intact. Patient is able to give a clear and coherent history. Able to add 7+5 Able to give # of quarters in $2.75 No signs of aphasia.  Cranial Nerves: II: Visual Fields are full. Pupils are equal, round, and reactive to light.  Discs are difficult to visualize. III,IV, VI: EOMI without ptosis or diploplia.  V: Facial sensation is symmetric to temperature VII: Facial movement is symmetric.  VIII: hearing is intact to voice X: Uvula elevates symmetrically XI: Shoulder shrug is symmetric. XII: tongue is midline without atrophy or fasciculations.  Motor: Tone is normal. Bulk is normal. 5/5 strength was present in all four extremities.  Sensory: Sensation is symmetric to light touch and temperature in the arms and legs. Deep Tendon Reflexes: 2+ and symmetric in the biceps and patellae.  Cerebellar: FNF and HKS are intact bilaterally Gait: Patient is able to stand, but requires assistence to walk, feels lightheaded and so full evaluation was not completed.   I have reviewed labs in epic and the results pertinent to this consultation are: Mild renal insufficiency A1C 12/17 6.5  I have reviewed the images obtained:MRI brain - lesion in the right frontal white matter which is significantly brighter on DWI than flair. Not dark on ADC. No susceptibility  CT head 12/18 - lesion appears to be present at this time.   Impression: 66 yo F with falls for the past month including several head injuries. I suspect that at least with the fall on 12/24 she sustained a mild concussion. I suspect that her falls are related to subacute infarct of the right frontal lobe.   Her pulse does increase significantly following standing, but her BP does not drop much. If her symptoms of dizziness with standing do not get much better after hydration, trileptal can sometimes contribute to a  "dizzy" sensation. I would certainly discuss this with psychiatry prior to making changes, however.   Recommendations: 1. fasting lipid panel 2. PT consult, OT consult, Speech consult 3. Echocardiogram 4. Carotid dopplers 5. Prophylactic therapy-Antiplatelet med: Plavix - dose 75mg (reports mouth breaks out with bumps when she takes ASA) 6. Risk factor modification 7. Telemetry monitoring 8. Frequent neuro checks   Ritta Slot, MD [facility] [phone]  If 7pm- 7am, please page neurology on call at [phone].

## 2012-02-10 NOTE — ED Notes (Signed)
Stood pt up to do orthostatic vital signs. Pt. Very unsteady on feet and with sitting. Pt. States "my legs feel all wobbly, I feel very unbalanced".

## 2012-02-10 NOTE — ED Notes (Signed)
Patient back from MRI.

## 2012-02-10 NOTE — Evaluation (Signed)
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 Pt currently residing at [facility] secondary to bipolar episode. Pt currently presenting with minimal balance deficits as well as decreased safety. Pt will benefit from skilled PT in the acute care setting in order to address the above deficits.     PT Assessment  Patient needs continued PT services    Follow Up Recommendations  Outpatient PT;Supervision/Assistance - 24 hour    Does the patient have the potential to tolerate intense rehabilitation      Barriers to Discharge        Equipment Recommendations  Other (comment) (TBD)    Recommendations for Other Services     Frequency Min 4X/week    Precautions / Restrictions Precautions Precautions: Fall Restrictions Weight Bearing Restrictions: No   Pertinent Vitals/Pain No pain complaints.       Mobility  Bed Mobility Bed Mobility: Supine to Sit;Sitting - Scoot to Edge of Bed;Sit to Supine Supine to Sit: 4: Min guard Sitting - Scoot to [place] of Bed: 4: Min guard Sit to Supine: 4: Min guard Transfers Transfers: Sit to Stand;Stand to Sit Sit to Stand: 4: Min guard;With upper extremity assist;From bed;From toilet Stand to Sit: 4: Min guard;With upper extremity assist;To bed;To toilet Details for Transfer Assistance: Minguard for safety. No LOBS Ambulation/Gait Ambulation/Gait Assistance: 4: Min guard;4: Min Environmental consultant (Feet): 200 Feet Assistive device: 1 person hand held assist Ambulation/Gait Assistance Details: Minguard majority of ambulation with min assist at times secondary to loss of balance posteriorly and to the left. Pt aware of balance  deficits. Increased fatigue with distance, slower gait speed Gait Pattern: Step-to pattern;Narrow base of support Gait velocity: slow General Gait Details: see balance section Modified Rankin (Stroke Patients Only) Pre-Morbid Rankin Score: No symptoms Modified Rankin: Moderately severe disability    Shoulder Instructions     Exercises     PT Diagnosis: Abnormality of gait  PT Problem List: Decreased activity tolerance;Decreased balance;Decreased mobility;Decreased knowledge of use of DME;Decreased safety awareness;Decreased knowledge of precautions PT Treatment Interventions: DME instruction;Gait training;Stair training;Functional mobility training;Therapeutic activities;Balance training;Neuromuscular re-education;Patient/family education   PT Goals Acute Rehab PT Goals PT Goal Formulation: With patient Time For Goal Achievement: 02/17/12 Potential to Achieve Goals: Fair Pt will go Sit to Stand: with supervision PT Goal: Sit to Stand - Progress: Goal set today Pt will go Stand to Sit: with supervision PT Goal: Stand to Sit - Progress: Goal set today Pt will Transfer Bed to Chair/Chair to Bed: with supervision PT Transfer Goal: Bed to Chair/Chair to Bed - Progress: Goal set today Pt will Ambulate: >150 feet;with supervision;with least restrictive assistive device PT Goal: Ambulate - Progress: Goal set today Pt will Go Up / Down Stairs: 3-5 stairs;with rail(s);with supervision PT Goal: Up/Down Stairs - Progress: Goal set today Additional Goals Additional Goal #1: Pt will score >19 on the DGI indicating a decreased fal risk PT Goal: Additional Goal #1 - Progress: Goal set today  Visit Information  Last PT Received On: 02/10/12 Assistance Needed: +1 PT/OT Co-Evaluation/Treatment: Yes    Subjective Data  Patient Stated Goal: to not fall anymore   Prior Functioning  Home Living Lives With: Spouse Available Help at Discharge: Family  Type of Home: House Home Access: Stairs to  enter [employer] of Steps: 3 Entrance Stairs-Rails: Right Home Layout: One level Bathroom Shower/Tub: Walk-in shower;Door [employer] Toilet: Standard Bathroom Accessibility: Yes How Accessible: Accessible via walker Home Adaptive Equipment: Grab bars in shower (has acess to cane/walker if need be) Prior Function Level of Independence: Independent Able to Take Stairs?: Yes Driving: Yes Vocation: Retired Musician: No difficulties Dominant Hand: Right    Cognition  Overall Cognitive Status: Impaired Arousal/Alertness: Awake/alert Orientation Level: Disoriented to;Time Behavior During Session: Flat affect Cognition - Other Comments: Pt able to recall phone number and home address as well as correctly answer basic safety questions (recalling what to do in case of fire at home).  However, pt had difficulty accurately using calendar to identify what day it is.  Calendar says "Sat/Sun" and pt verbalizing "Today is Saturday/Sunday".    Extremity/Trunk Assessment Right Lower Extremity Assessment RLE ROM/Strength/Tone: Within functional levels RLE Sensation: WFL - Light Touch RLE Coordination: WFL - gross/fine motor Left Lower Extremity Assessment LLE ROM/Strength/Tone: Within functional levels LLE Sensation: WFL - Light Touch LLE Coordination: WFL - gross/fine motor Trunk Assessment Trunk Assessment: Normal   Balance Balance Balance Assessed: Yes Standardized Balance Assessment Standardized Balance Assessment: Dynamic Gait Index Dynamic Gait Index Level Surface: Mild Impairment Change in Gait Speed: Mild Impairment Gait with Horizontal Head Turns: Mild Impairment Gait with Vertical Head Turns: Normal Gait and Pivot Turn: Normal Step Over Obstacle: Mild Impairment Step Around Obstacles: Normal  End of Session PT - End of Session Equipment Utilized During Treatment: Gait belt Activity Tolerance: Patient tolerated treatment well Patient left: in  bed;with call bell/phone within reach;Other (comment);with bed alarm set Psychiatrist in room) Nurse Communication: Mobility status  GP Functional Assessment Tool Used: clinical judgement Functional Limitation: Mobility: Walking and moving around Mobility: Walking and Moving Around Current Status ([id number]): At least 20 percent but less than 40 percent impaired, limited or restricted Mobility: Walking and Moving Around Goal Status [phone]): At least 1 percent but less than 20 percent impaired, limited or restricted   Milana Kidney 02/10/2012, 5:02 PM  02/10/2012 Milana Kidney DPT PAGER: [phone] OFFICE: [phone]

## 2012-02-10 NOTE — H&P (Signed)
[facility] History and Physical  [name] ZOX:[id number] DOB: 1945-09-29 DOA: 02/10/2012  Referring physician: ER PCP: [name]., MD   Chief Complaint: Altered mental status  HPI:  [name] is a 66 y.o. female with a history of repeated falls over the past few weeks to month. She states that when she is up she will walk a few steps, then get lightheaded and her legs will go out from under her. This has caused her to fall and injure herself multiple times. She also has not been eating or drinking well. She fell on 12/24 with a possible LOC, though this is unclear and also gave herself a laceration over her left eye last night. She has had some nausea and vomiting after her head injury on 12/24. She had an MRI which shows a subacute diffusion change in her right frontal area which could represent traumatic injury or subacute infarction. She had a CTA head and CT on 12/18 which was before her serious falls and this lesion is present at that time as well.  She feels like it may be improving slightly, though she still does not feel stable by herself.  She is currently admitted to behavioral health for symptoms similar to previous manic episodes and had been off of her lithium due to possible side effects. Neurology was consulted in the ER, they found that the patient's symptoms are secondary to mild concussion from her fall as well as a subacute infarct in the right frontal lobe       Review of Systems: negative for the following  Constitutional: Denies fever, chills, diaphoresis, appetite change and fatigue.  HEENT: Denies photophobia, eye pain, redness, hearing loss, ear pain, congestion, sore throat, rhinorrhea, sneezing, mouth sores, trouble swallowing, neck pain, neck stiffness and tinnitus.  Respiratory: Denies SOB, DOE, cough, chest tightness, and wheezing.  Cardiovascular: Denies chest pain, palpitations and leg swelling.  Gastrointestinal: Denies nausea,  vomiting, abdominal pain, diarrhea, constipation, blood in stool and abdominal distention.  Genitourinary: Denies dysuria, urgency, frequency, hematuria, flank pain and difficulty urinating.  Musculoskeletal: Denies myalgias, back pain, joint swelling, arthralgias and gait problem.  Skin: Denies pallor, rash and wound.  Neurological: Denies dizziness, seizures, syncope, weakness, light-headedness, numbness and headaches.  Hematological: Denies adenopathy. Easy bruising, personal or family bleeding history  Psychiatric/Behavioral: Denies suicidal ideation, mood changes, confusion, nervousness, sleep disturbance and agitation       History reviewed. No pertinent past medical history.   History reviewed. No pertinent past surgical history.    Social History:  reports that she has quit smoking. Her smoking use included Cigarettes. She has a 30 pack-year smoking history. She does not have any smokeless tobacco history on file. Her alcohol and drug histories not on file. \ Family history Family History:  brother - stroke 3 years ago  Social History:  Tob: Quit smoking > 10 years ago   Allergies  Allergen Reactions  . Sulfa Antibiotics     Rash on lips    No family history on file.   Prior to Admission medications   Medication Sig Start Date End Date Taking? Authorizing Provider  alum & mag hydroxide-simeth (MAALOX/MYLANTA) 200-200-20 MG/5ML suspension Take 30 mLs by mouth every 6 (six) hours as needed. Acid reflux   Yes Historical Provider, MD  levothyroxine (SYNTHROID, LEVOTHROID) 125 MCG tablet Take 125 mcg by mouth daily.   Yes Historical Provider, MD  lurasidone (LATUDA) 80 MG TABS Take 80 mg by mouth daily with supper.  Yes Historical Provider, MD  magnesium hydroxide (MILK OF MAGNESIA) 400 MG/5ML suspension Take 30 mLs by mouth daily as needed. For constipation   Yes Historical Provider, MD  ondansetron (ZOFRAN-ODT) 4 MG disintegrating tablet Take 4 mg by mouth every 8  (eight) hours as needed. nausea   Yes Historical Provider, MD  oxcarbazepine (TRILEPTAL) 600 MG tablet Take 600 mg by mouth at bedtime.   Yes Historical Provider, MD  potassium chloride SA (K-DUR,KLOR-CON) 20 MEQ tablet Take 20 mEq by mouth daily.   Yes Historical Provider, MD  traZODone (DESYREL) 50 MG tablet Take 50 mg by mouth at bedtime.   Yes Historical Provider, MD  estrogens, conjugated, (PREMARIN) 0.625 MG tablet Take 0.625 mg by mouth daily.     Historical Provider, MD  lithium carbonate (ESKALITH) 450 MG CR tablet Take 450 mg by mouth 2 (two) times daily.    Historical Provider, MD     Physical Exam: Filed Vitals:   02/10/12 1108 02/10/12 1109 02/10/12 1114 02/10/12 1408  BP: 124/78 114/71 124/78 127/55  Pulse: 75 83 93 67  Temp:    97.8 F (36.6 C)  TempSrc:      Resp:      SpO2:    98%     Constitutional: Vital signs reviewed. Patient is a well-developed and well-nourished in no acute distress and cooperative with exam. Alert and oriented x3.  Head: Normocephalic and atraumatic  Ear: TM normal bilaterally  Mouth: no erythema or exudates, MMM  Eyes: PERRL, EOMI, conjunctivae normal, No scleral icterus.  Neck: Supple, Trachea midline normal ROM, No JVD, mass, thyromegaly, or carotid bruit present.  Cardiovascular: RRR, S1 normal, S2 normal, no MRG, pulses symmetric and intact bilaterally  Pulmonary/Chest: CTAB, no wheezes, rales, or rhonchi  Abdominal: Soft. Non-tender, non-distended, bowel sounds are normal, no masses, organomegaly, or guarding present.  GU: no CVA tenderness Musculoskeletal: No joint deformities, erythema, or stiffness, ROM full and no nontender Ext: no edema and no cyanosis, pulses palpable bilaterally (DP and PT)  Hematology: no cervical, inginal, or axillary adenopathy.  Cranial Nerves:  II: Visual Fields are full. Pupils are equal, round, and reactive to light. Discs are difficult to visualize.  III,IV, VI: EOMI without ptosis or diploplia.  V:  Facial sensation is symmetric to temperature  VII: Facial movement is symmetric.  VIII: hearing is intact to voice  X: Uvula elevates symmetrically  XI: Shoulder shrug is symmetric.  XII: tongue is midline without atrophy or fasciculations.  Motor:  Tone is normal. Bulk is normal. 5/5 strength was present in all four extremities.  Sensory:  Sensation is symmetric to light touch and temperature in the arms and legs.  Deep Tendon Reflexes:  2+ and symmetric in the biceps and patellae.  Cerebellar:  FNF and HKS are intact bilaterally  Gait:  Patient is able to stand, but requires assistence to walk, feels lightheaded and so full evaluation was not completed.   Skin: Warm, dry and intact. No rash, cyanosis, or clubbing.  Psychiatric: Normal mood and affect. speech and behavior is normal. Judgment and thought content normal. Cognition and memory are normal.       Labs on Admission:    Basic Metabolic Panel:  Lab 02/10/12 1610 02/07/12 0627 02/05/12 1947  NA 144 148* 144  K 3.3* 3.6 3.5  CL 110 113* 112  CO2 26 25 24   GLUCOSE 120* 122* 174*  BUN 15 11 9   CREATININE 1.23* 1.22* 1.33*  CALCIUM 10.7* 11.6* 10.8*  MG -- -- --  PHOS -- -- --   Liver Function Tests:  Lab 02/05/12 1947  AST 66*  ALT 35  ALKPHOS 71  BILITOT 0.4  PROT 7.1  ALBUMIN 3.5   No results found for this basename: LIPASE:5,AMYLASE:5 in the last 168 hours No results found for this basename: AMMONIA:5 in the last 168 hours CBC:  Lab 02/10/12 0243 02/05/12 1947  WBC 7.2 7.6  NEUTROABS 5.7 6.1  HGB 11.9* 12.1  HCT 38.0 38.3  MCV 99.0 98.2  PLT 218 283   Cardiac Enzymes: No results found for this basename: CKTOTAL:5,CKMB:5,CKMBINDEX:5,TROPONINI:5 in the last 168 hours  BNP (last 3 results) No results found for this basename: PROBNP:3 in the last 8760 hours    CBG:  Lab 02/06/12 1027  GLUCAP 99    Radiological Exams on Admission: Dg Chest 2 View  02/10/2012  *RADIOLOGY REPORT*   Clinical Data: Stroke.  Fall  CHEST - 2 VIEW  Comparison: None  Findings: Heart size is normal.  No pleural effusion identified. Plate-like atelectasis versus scarring is noted in both lung bases. No airspace consolidation.  The visualized osseous structures are unremarkable.  IMPRESSION:  1.  Bibasilar scar versus plate-like atelectasis.   Original Report Authenticated By: Signa Kell, M.D.    Ct Head Wo Contrast  02/09/2012  *RADIOLOGY REPORT*  Clinical Data:  Status post fall, with injury to the left supraorbital region.  Concern for cervical spine injury.  CT HEAD WITHOUT CONTRAST AND CT CERVICAL SPINE WITHOUT CONTRAST  Technique:  Multidetector CT imaging of the head and cervical spine was performed following the standard protocol without intravenous contrast.  Multiplanar CT image reconstructions of the cervical spine were also generated.  Comparison: CTA of the head performed 01/30/2012  CT HEAD  Findings: There is no evidence of acute infarction, mass lesion, or intra- or extra-axial hemorrhage on CT.  Scattered periventricular and subcortical white matter change likely reflects small vessel ischemic microangiopathy.  The posterior fossa, including the cerebellum, brainstem and fourth ventricle, is within normal limits.  The third and lateral ventricles, and basal ganglia are unremarkable in appearance.  The cerebral hemispheres demonstrate grossly normal gray-white differentiation.  No mass effect or midline shift is seen.  There is no evidence of fracture; visualized osseous structures are unremarkable in appearance.  The orbits are within normal limits. The paranasal sinuses and mastoid air cells are well-aerated.  A soft tissue laceration is noted just superior to the left orbit.  IMPRESSION:  1.  No evidence of traumatic intracranial injury or fracture. 2.  Soft tissue laceration just superior to the left orbit. 3.  Scattered small vessel ischemic microangiopathy.  CT CERVICAL SPINE  Findings:  There is no evidence of fracture or subluxation. Vertebral bodies demonstrate normal height and alignment.  There is mild narrowing of the intervertebral disc spaces at C4-C5 and C5- C6, with associated small posterior disc osteophyte complexes. Prevertebral soft tissues are within normal limits.  The visualized neural foramina are grossly unremarkable.  The thyroid gland is markedly diminutive.  The visualized lung apices are clear.  No significant soft tissue abnormalities are seen.  IMPRESSION:  1.  No evidence of fracture or subluxation along the cervical spine. 2.  Minimal degenerative change at the mid cervical spine.   Original Report Authenticated By: Tonia Ghent, M.D.    Ct Cervical Spine Wo Contrast  02/09/2012  *RADIOLOGY REPORT*  Clinical Data:  Status post fall, with injury to the left supraorbital region.  Concern for cervical spine injury.  CT HEAD WITHOUT CONTRAST AND CT CERVICAL SPINE WITHOUT CONTRAST  Technique:  Multidetector CT imaging of the head and cervical spine was performed following the standard protocol without intravenous contrast.  Multiplanar CT image reconstructions of the cervical spine were also generated.  Comparison: CTA of the head performed 01/30/2012  CT HEAD  Findings: There is no evidence of acute infarction, mass lesion, or intra- or extra-axial hemorrhage on CT.  Scattered periventricular and subcortical white matter change likely reflects small vessel ischemic microangiopathy.  The posterior fossa, including the cerebellum, brainstem and fourth ventricle, is within normal limits.  The third and lateral ventricles, and basal ganglia are unremarkable in appearance.  The cerebral hemispheres demonstrate grossly normal gray-white differentiation.  No mass effect or midline shift is seen.  There is no evidence of fracture; visualized osseous structures are unremarkable in appearance.  The orbits are within normal limits. The paranasal sinuses and mastoid air cells are  well-aerated.  A soft tissue laceration is noted just superior to the left orbit.  IMPRESSION:  1.  No evidence of traumatic intracranial injury or fracture. 2.  Soft tissue laceration just superior to the left orbit. 3.  Scattered small vessel ischemic microangiopathy.  CT CERVICAL SPINE  Findings: There is no evidence of fracture or subluxation. Vertebral bodies demonstrate normal height and alignment.  There is mild narrowing of the intervertebral disc spaces at C4-C5 and C5- C6, with associated small posterior disc osteophyte complexes. Prevertebral soft tissues are within normal limits.  The visualized neural foramina are grossly unremarkable.  The thyroid gland is markedly diminutive.  The visualized lung apices are clear.  No significant soft tissue abnormalities are seen.  IMPRESSION:  1.  No evidence of fracture or subluxation along the cervical spine. 2.  Minimal degenerative change at the mid cervical spine.   Original Report Authenticated By: Tonia Ghent, M.D.    Mr Brain Wo Contrast  02/10/2012  *RADIOLOGY REPORT*  Clinical Data: Falling.  Head trauma.  MRI HEAD WITHOUT CONTRAST  Technique:  Multiplanar, multiecho pulse sequences of the brain and surrounding structures were obtained according to standard protocol without intravenous contrast.  Comparison: Head CT 02/09/2012  Findings: The brain shows a background pattern of atrophy with mild chronic small vessel change affecting the deep and subcortical white matter.  In the white matter of the right frontal lobe, there are punctate foci of signal abnormality at this show restricted diffusion, increased T2 signal and perhaps minimal susceptibility. In this setting of fall with frontal head trauma, I think this is most likely to represent a cluster of small shear injuries.  The possibility that these could represent ischemic infarctions cannot be excluded but is not favored.  There is no blood collection that one would described as a hematoma.  No  sign of neoplastic mass lesion, hydrocephalus or extra-axial collection.  No pituitary mass.  No fluid in the sinuses.  Frontal scalp injury is evident on the left.  IMPRESSION: Cluster of small abnormal foci in the right frontal white matter that I favor represent  shear injuries related to the fall and head trauma.  See above discussion.  The possibility exists that these could be acute subcortical ischemic infarctions, but that is not favored.   Original Report Authenticated By: Paulina Fusi, M.D.     EKG: Independently reviewed. Date: 02/10/2012  Rate: 69  Rhythm: normal sinus rhythm  QRS Axis: left  Intervals: normal  ST/T Wave abnormalities: nonspecific ST  changes  Conduction Disutrbances:none  Narrative Interpretation:  Old EKG Reviewed: unchanged   Assessment/Plan   1. Subacute CVA, in the right frontal lobe, being admitted for. Workup including echocardiogram, carotid Doppler, will start her on Plavix 75 g daily, Imitrex, telemetry 2. Bipolar disorder continue outpatient medications, check orthostatics, lithium level is subtherapeutic, likely not constituting to her symptoms 3. Hypothyroidism, continue Synthroid, check TSH 4.   Code Status:   full Family Communication: bedside Disposition Plan: admit   Time spent: 70 mins   [facility] Pager [phone]  If 7PM-7AM, please contact night-coverage [email or web address] Password [email or web address] 02/10/2012, 2:40 PM

## 2012-02-10 NOTE — Progress Notes (Signed)
VASCULAR LAB PRELIMINARY  PRELIMINARY  PRELIMINARY  PRELIMINARY  Carotid duplex completed.    Preliminary report:  Bilateral:  No evidence of hemodynamically significant internal carotid artery stenosis.   Vertebral artery flow is antegrade.     Madaline Lefeber, RVS 02/10/2012, 3:28 PM

## 2012-02-10 NOTE — ED Notes (Signed)
Pt back from radiology 

## 2012-02-10 NOTE — Progress Notes (Addendum)
Called to Pt room @ 2240 due to Pt head laceration bleeding. Pt lost her balance in bed after blowing nose, causing her to fall to one side and graze laceration on side rail.  1:1 at bedside.  Pt wound cleaned and Pt repositioned in bed.  At approximately 2245, Pt stated she felt like she was going to vomit.  Placed in high Fowler's position.  After some dry heaving, Pt eventually vomited minute amounts of clear liquid over three separate episodes with total volume approximately 15cc.  Due to Pt having sustained blow to head in fall on 02/08/2012, this RN requested [facility] to conduct second assessment of Pt.  Pt pupils found to be sluggish.  On-call PA directed that EMS be called and Pt transported to [facility] ED for assessment.  Report called to Charge Nurse at [facility] ED.  This RN then returned to Pt's room, approximately 15 minutes after leaving.  Pt found to be confused, groggy, and could not recall staff names, although she had called me by name immediately prior to my leaving room to consult supervisor.  EMS arrived at approximately 2300; Pt transported to [facility] ED with 1:1 accompanying.  Pt emergency contact informed. Dion Saucier RN  02/10/2012 0540 RN from [facility] ED in contact with Binnie Rail RN (Charge).  Pt to remain at [facility] through morning until can undergo MRI; pending results, will return to [facility]. Dion Saucier RN

## 2012-02-10 NOTE — ED Notes (Addendum)
Patient transported to radiology

## 2012-02-10 NOTE — ED Provider Notes (Signed)
History     CSN: [id number]  Arrival date & time 02/10/12  0009   First MD Initiated Contact with Patient 02/10/12 0112      Chief Complaint  Patient presents with  . Aphasia     Patient is a 66 y.o. female presenting with weakness. The history is provided by the patient and the spouse.  Weakness Primary symptoms do not include headaches, syncope, loss of consciousness, seizures, focal weakness or vomiting. The symptoms began 5 to 7 days ago. The symptoms are unchanged. The neurological symptoms are diffuse.  Additional symptoms include weakness. Additional symptoms do not include neck stiffness.  pt presents from [facility] for concern for weakness/dysarthria Pt has been at [facility] for bipolar disorder For past week she has had difficulty walking.  She fell recently (12/28) had negative ct head/cspine and had laceration repair to left forehead.  Today she reports she almost fell but only grazed her head on railing.  No LOC.  No new headache.  No new visual changes.  No focal weakness.  She reports difficulty walking.  No cp/sob.  She reports her speech has been different recently but not acutely worsened/changed tonight  PMH - bipolar  History reviewed. No pertinent past surgical history.  No family history on file.  History  Substance Use Topics  . Smoking status: Former Smoker -- 1.0 packs/day for 30 years    Types: Cigarettes  . Smokeless tobacco: Not on file  . Alcohol Use: Not on file    OB History    Grav Para Term Preterm Abortions TAB SAB Ect Mult Living                  Review of Systems  HENT: Negative for neck stiffness.   Cardiovascular: Negative for syncope.  Gastrointestinal: Negative for vomiting.  Neurological: Positive for weakness. Negative for focal weakness, seizures, loss of consciousness and headaches.  All other systems reviewed and are negative.    Allergies  Sulfa antibiotics  Home Medications   Current Outpatient Rx  Name  Route  Sig  Dispense   Refill  . ALUM & MAG HYDROXIDE-SIMETH 200-200-20 MG/5ML PO SUSP   Oral   Take 30 mLs by mouth every 6 (six) hours as needed. Acid reflux         . LEVOTHYROXINE SODIUM 125 MCG PO TABS   Oral   Take 125 mcg by mouth daily.         Marland Kitchen LURASIDONE HCL 80 MG PO TABS   Oral   Take 80 mg by mouth daily with supper.         Marland Kitchen MAGNESIUM HYDROXIDE 400 MG/5ML PO SUSP   Oral   Take 30 mLs by mouth daily as needed. For constipation         . ONDANSETRON 4 MG PO TBDP   Oral   Take 4 mg by mouth every 8 (eight) hours as needed. nausea         . OXCARBAZEPINE 600 MG PO TABS   Oral   Take 600 mg by mouth at bedtime.         Marland Kitchen POTASSIUM CHLORIDE CRYS ER 20 MEQ PO TBCR   Oral   Take 20 mEq by mouth daily.         . TRAZODONE HCL 50 MG PO TABS   Oral   Take 50 mg by mouth at bedtime.         . ESTROGENS CONJUGATED 0.625 MG PO TABS  Oral   Take 0.625 mg by mouth daily.          Marland Kitchen LITHIUM CARBONATE ER 450 MG PO TBCR   Oral   Take 450 mg by mouth 2 (two) times daily.           BP 121/71  Pulse 75  Temp 98.1 F (36.7 C) (Oral)  Resp 18  SpO2 95%  Physical Exam CONSTITUTIONAL: Well developed/well nourished HEAD AND FACE:  Healing laceration to left forehead EYES: EOMI/PERRL, no nystagmus ENMT: Mucous membranes moist, No evidence of facial/nasal trauma NECK: supple no meningeal signs SPINE:entire spine nontender, No bruising/crepitance/stepoffs noted to spine CV: S1/S2 noted, no murmurs/rubs/gallops noted LUNGS: Lungs are clear to auscultation bilaterally, no apparent distress ABDOMEN: soft, nontender, no rebound or guarding GU:no cva tenderness NEURO: Pt is awake/alert, moves all extremitiesx4. No arm/leg drift.  No past pointing.  She does have difficulty walking and difficulty getting out of bed.  She has no dysarthria on my exam. No aphasia noted EXTREMITIES: pulses normal, full ROM, no signs of trauma SKIN: warm, color normal PSYCH: flat affect  ED  Course  Procedures    Labs Reviewed  BASIC METABOLIC PANEL  CBC WITH DIFFERENTIAL  LITHIUM LEVEL   Ct Head Wo Contrast  02/09/2012  *RADIOLOGY REPORT*  Clinical Data:  Status post fall, with injury to the left supraorbital region.  Concern for cervical spine injury.  CT HEAD WITHOUT CONTRAST AND CT CERVICAL SPINE WITHOUT CONTRAST  Technique:  Multidetector CT imaging of the head and cervical spine was performed following the standard protocol without intravenous contrast.  Multiplanar CT image reconstructions of the cervical spine were also generated.  Comparison: CTA of the head performed 01/30/2012  CT HEAD  Findings: There is no evidence of acute infarction, mass lesion, or intra- or extra-axial hemorrhage on CT.  Scattered periventricular and subcortical white matter change likely reflects small vessel ischemic microangiopathy.  The posterior fossa, including the cerebellum, brainstem and fourth ventricle, is within normal limits.  The third and lateral ventricles, and basal ganglia are unremarkable in appearance.  The cerebral hemispheres demonstrate grossly normal gray-white differentiation.  No mass effect or midline shift is seen.  There is no evidence of fracture; visualized osseous structures are unremarkable in appearance.  The orbits are within normal limits. The paranasal sinuses and mastoid air cells are well-aerated.  A soft tissue laceration is noted just superior to the left orbit.  IMPRESSION:  1.  No evidence of traumatic intracranial injury or fracture. 2.  Soft tissue laceration just superior to the left orbit. 3.  Scattered small vessel ischemic microangiopathy.  CT CERVICAL SPINE  Findings: There is no evidence of fracture or subluxation. Vertebral bodies demonstrate normal height and alignment.  There is mild narrowing of the intervertebral disc spaces at C4-C5 and C5- C6, with associated small posterior disc osteophyte complexes. Prevertebral soft tissues are within normal limits.   The visualized neural foramina are grossly unremarkable.  The thyroid gland is markedly diminutive.  The visualized lung apices are clear.  No significant soft tissue abnormalities are seen.  IMPRESSION:  1.  No evidence of fracture or subluxation along the cervical spine. 2.  Minimal degenerative change at the mid cervical spine.   Original Report Authenticated By: Tonia Ghent, M.D.    Ct Cervical Spine Wo Contrast  02/09/2012  *RADIOLOGY REPORT*  Clinical Data:  Status post fall, with injury to the left supraorbital region.  Concern for cervical spine injury.  CT HEAD  WITHOUT CONTRAST AND CT CERVICAL SPINE WITHOUT CONTRAST  Technique:  Multidetector CT imaging of the head and cervical spine was performed following the standard protocol without intravenous contrast.  Multiplanar CT image reconstructions of the cervical spine were also generated.  Comparison: CTA of the head performed 01/30/2012  CT HEAD  Findings: There is no evidence of acute infarction, mass lesion, or intra- or extra-axial hemorrhage on CT.  Scattered periventricular and subcortical white matter change likely reflects small vessel ischemic microangiopathy.  The posterior fossa, including the cerebellum, brainstem and fourth ventricle, is within normal limits.  The third and lateral ventricles, and basal ganglia are unremarkable in appearance.  The cerebral hemispheres demonstrate grossly normal gray-white differentiation.  No mass effect or midline shift is seen.  There is no evidence of fracture; visualized osseous structures are unremarkable in appearance.  The orbits are within normal limits. The paranasal sinuses and mastoid air cells are well-aerated.  A soft tissue laceration is noted just superior to the left orbit.  IMPRESSION:  1.  No evidence of traumatic intracranial injury or fracture. 2.  Soft tissue laceration just superior to the left orbit. 3.  Scattered small vessel ischemic microangiopathy.  CT CERVICAL SPINE  Findings:  There is no evidence of fracture or subluxation. Vertebral bodies demonstrate normal height and alignment.  There is mild narrowing of the intervertebral disc spaces at C4-C5 and C5- C6, with associated small posterior disc osteophyte complexes. Prevertebral soft tissues are within normal limits.  The visualized neural foramina are grossly unremarkable.  The thyroid gland is markedly diminutive.  The visualized lung apices are clear.  No significant soft tissue abnormalities are seen.  IMPRESSION:  1.  No evidence of fracture or subluxation along the cervical spine. 2.  Minimal degenerative change at the mid cervical spine.   Original Report Authenticated By: Tonia Ghent, M.D.     3:37 AM She has no history to suggest acute CVA.  Difficulty walking for several days, could be med related.  Will check labs and reassess.   6:10 AM Labs reassuring.  She has no focal motor deficits.  She is at baseline mental status.  She still has difficulty walking but has been present >1 week per patient/family.  Nurse has d/w nursing at [facility]. Given persistent symptoms, will check trileptal level and obtain MRI here.  If MR negative, she can be sent back to [facility].  Pt/family agreeable with this plan  MDM  Nursing notes including past medical history and social history reviewed and considered in documentation Previous records reviewed and considered Labs/vital reviewed and considered        Date: 02/10/2012  Rate: 69  Rhythm: normal sinus rhythm  QRS Axis: left  Intervals: normal  ST/T Wave abnormalities: nonspecific ST changes  Conduction Disutrbances:none  Narrative Interpretation:   Old EKG Reviewed: unchanged    Joya Gaskins, MD 02/10/12 [phone]

## 2012-02-11 ENCOUNTER — Observation Stay (HOSPITAL_COMMUNITY)

## 2012-02-11 DIAGNOSIS — E039 Hypothyroidism, unspecified: Secondary | ICD-10-CM

## 2012-02-11 DIAGNOSIS — I6350 Cerebral infarction due to unspecified occlusion or stenosis of unspecified cerebral artery: Secondary | ICD-10-CM

## 2012-02-11 DIAGNOSIS — F319 Bipolar disorder, unspecified: Secondary | ICD-10-CM

## 2012-02-11 LAB — LIPID PANEL
Cholesterol: 201 mg/dL — ABNORMAL HIGH (ref 0–200)
Total CHOL/HDL Ratio: 5.7 RATIO

## 2012-02-11 LAB — HEMOGLOBIN A1C
Hgb A1c MFr Bld: 6.2 % — ABNORMAL HIGH (ref <5.7)
Mean Plasma Glucose: 131 mg/dL — ABNORMAL HIGH (ref <117)

## 2012-02-11 MED ORDER — WHITE PETROLATUM GEL
Status: AC
  Administered 2012-02-11: 0.2
  Filled 2012-02-11: qty 5

## 2012-02-11 MED ORDER — LEVOTHYROXINE SODIUM 100 MCG PO TABS
100.0000 ug | ORAL_TABLET | Freq: Every day | ORAL | Status: DC
  Administered 2012-02-12 – 2012-02-13 (×2): 100 ug via ORAL
  Filled 2012-02-11 (×3): qty 1

## 2012-02-11 NOTE — Progress Notes (Signed)
EEG completed with no visitors in room as ordered

## 2012-02-11 NOTE — Progress Notes (Signed)
Physical Therapy Treatment Patient Details Name: [name] MRN: [id number] DOB: [birth year] Today's Date: 02/11/2012 Time: 4098-1191 PT Time Calculation (min): 15 min  PT Assessment / Plan / Recommendation Comments on Treatment Session  Pt reports she cont's to feel as though she needs UE support while ambulating.  Will try [facility] next PT session.      Follow Up Recommendations  Outpatient PT;Supervision/Assistance - 24 hour     Does the patient have the potential to tolerate intense rehabilitation     Barriers to Discharge        Equipment Recommendations       Recommendations for Other Services    Frequency Min 4X/week   Plan Discharge plan remains appropriate    Precautions / Restrictions Precautions Precautions: Fall Restrictions Weight Bearing Restrictions: No    Mobility  Bed Mobility Bed Mobility: Supine to Sit;Sitting - Scoot to Edge of Bed Supine to Sit: 5: Supervision;HOB flat Sitting - Scoot to Edge of Bed: 5: Supervision Transfers Transfers: Sit to Stand;Stand to Sit Sit to Stand: 4: Min guard;With upper extremity assist;From bed Stand to Sit: 4: Min guard;With upper extremity assist;With armrests;To chair/3-in-1 Details for Transfer Assistance: Cues for hand placement & technique.   Ambulation/Gait Ambulation/Gait Assistance: 4: Min guard Ambulation Distance (Feet): 220 Feet Assistive device: Other (Comment) (pushing IV pole) Ambulation/Gait Assistance Details: Pt pushing IV pole with LUE.  She states she feels like she needs support for stability.   Guarding for safety.   Gait Pattern: Step-through pattern;Decreased stride length;Narrow base of support Gait velocity: slow Modified Rankin (Stroke Patients Only) Pre-Morbid Rankin Score: No symptoms Modified Rankin: Moderately severe disability      PT Goals Acute Rehab PT Goals Time For Goal Achievement: 02/17/12 Potential to Achieve Goals: Fair Pt will go Sit to Stand: with supervision PT  Goal: Sit to Stand - Progress: Progressing toward goal Pt will go Stand to Sit: with supervision PT Goal: Stand to Sit - Progress: Progressing toward goal Pt will Transfer Bed to Chair/Chair to Bed: with supervision PT Transfer Goal: Bed to Chair/Chair to Bed - Progress: Progressing toward goal Pt will Ambulate: >150 feet;with supervision;with least restrictive assistive device PT Goal: Ambulate - Progress: Progressing toward goal Pt will Go Up / Down Stairs: 3-5 stairs;with rail(s);with supervision  Visit Information  Last PT Received On: 02/11/12 Assistance Needed: +1    Subjective Data      Cognition  Arousal/Alertness: Awake/alert Orientation Level: Appears intact for tasks assessed Behavior During Session: Flat affect    Balance     End of Session PT - End of Session Equipment Utilized During Treatment: Gait belt Activity Tolerance: Patient tolerated treatment well Patient left: in chair;with call bell/phone within reach Nurse Communication: Mobility status     Verdell Face, [place] [phone] 02/11/2012

## 2012-02-11 NOTE — Consult Note (Signed)
Patient Identification:  [name] Date of Evaluation:  02/11/2012 Reason for Consult:  Larey Seat at [facility] and was transported to [facility], ED, [phone]  Referring Provider:  Dr. Susie Cassette   History of Present Illness:  Pt has been falling for several weeks.  She has fallen several times after feeling lightheaded.  She has not been eatng.  12/14 she had LOC and fall caused laceration above L eyebrow.  Neurology consult in ED determined pt had a mild concussion and a subacute infarct on R frontal lobe.   Past Psychiatric History:  Bipolar Disorder, taking Lithium for many years.  PCP and provider [facility] told pt to stop medication.  She has been falling at home several times.     Past Medical History:    History reviewed. No pertinent past medical history.    History reviewed. No pertinent past surgical history.  Allergies:  Allergies  Allergen Reactions  . Sulfa Antibiotics     Rash on lips    Current Medications:  Prior to Admission medications   Medication Sig Start Date End Date Taking? Authorizing Provider  alum & mag hydroxide-simeth (MAALOX/MYLANTA) 200-200-20 MG/5ML suspension Take 30 mLs by mouth every 6 (six) hours as needed. Acid reflux   Yes Historical Provider, MD  levothyroxine (SYNTHROID, LEVOTHROID) 125 MCG tablet Take 125 mcg by mouth daily.   Yes Historical Provider, MD  lurasidone (LATUDA) 80 MG TABS Take 80 mg by mouth daily with supper.   Yes Historical Provider, MD  magnesium hydroxide (MILK OF MAGNESIA) 400 MG/5ML suspension Take 30 mLs by mouth daily as needed. For constipation   Yes Historical Provider, MD  ondansetron (ZOFRAN-ODT) 4 MG disintegrating tablet Take 4 mg by mouth every 8 (eight) hours as needed. nausea   Yes Historical Provider, MD  oxcarbazepine (TRILEPTAL) 600 MG tablet Take 600 mg by mouth at bedtime.   Yes Historical Provider, MD  potassium chloride SA (K-DUR,KLOR-CON) 20 MEQ tablet Take 20 mEq by mouth daily.   Yes Historical Provider, MD    traZODone (DESYREL) 50 MG tablet Take 50 mg by mouth at bedtime.   Yes Historical Provider, MD  estrogens, conjugated, (PREMARIN) 0.625 MG tablet Take 0.625 mg by mouth daily.     Historical Provider, MD  lithium carbonate (ESKALITH) 450 MG CR tablet Take 450 mg by mouth 2 (two) times daily.    Historical Provider, MD    Social History:    reports that she has quit smoking. Her smoking use included Cigarettes. She has a 30 pack-year smoking history. She does not have any smokeless tobacco history on file. Her alcohol and drug histories not on file.   Family History:    No family history on file.  Mental Status Examination/Evaluation: Objective:  Appearance: Neat with sutures over L eyebrow, deeep red ecchymoses - L orbital area deep red ecchymoses  Eye Contact::  Good  Speech:  Normal clear and normal rate  Volume:  Normal  Mood: Improved  Affect:  Congruent  Thought Process: Organized   Orientation:  Oriented to person place date  Though Content:  Coherent, organized  Suicidal Thoughts:  No  Homicidal Thoughts:  No  Judgement:  intact  Insight:  good   DIAGNOSIS:   AXIS I  Bipolar 1  AXIS II  Deferred  AXIS III See medical notes.  AXIS IV problem regulating medications had stopped Lithium   AXIS V 51-60 moderate symptoms   Assessment/Plan:     RECOMMENDATION:  1.  Pt  has capacity and is aware of her diagnosis and need for medication. 2.  Pt declines to return to [facility]. 3.  Work up for falling is pending 4.  Resume Rx for Bipolar Disorder when medically stable.  Latuda 40 mg daily is protective for cardiac and metabolic function. To be increased  [outpatient] when ready to return to former 80 mg dose.   Evaluate for efficacy and tolerability before addition of other medications.  Trazodone 50 mg for sleep.  5.   No further psychiatric needs.  MD Psychiatrist signs off. Mickeal Skinner MD 02/11/2012 1:36 PM

## 2012-02-11 NOTE — Progress Notes (Signed)
[facility] PROGRESS NOTE  [name] OZH:[id number] DOB: 1945/07/04 DOA: 02/10/2012 PCP: Alva Garnet., MD  Assessment/Plan: 1. Ataxia with several falls and head injuries: Mild concussion suspected. Per Dr. Pearlean Brownie (discuss personally), stroke is doubted. Complete Stroke workup. EEG per neurology. Plavix per neurology. Will need outpatient neurology followup for further evaluation. 2. Dizziness: Possible orthostasis. Also consider Trileptal it is not improved. Check orthostatics. 3. Suspected chronic kidney disease stage III: Appears stable. 4. Bipolar disorder, manic with psychotic features: Off lithium at [facility] secondary possible side effects? Psychiatry consultation placed for assistance with medication management. Lithium level was low. 5. Hypothyroidism: Continue Synthroid. TSH level low, strange considering recent level was high December 17. I cannot tell from documentation whether she has had a dosage change or not. Decrease Synthroid dose, recheck in 4 weeks. 6. Recent head laceration prior to admission 12/28: Stitches out 1/2. Vaseline to moisturize and remove blood.  Code Status: Full code Family Communication: None present Disposition Plan: Return to [facility], likely 12/31.  Brendia Sacks, MD  [facility] Team 4  Pager [phone] If 7PM-7AM, please contact night-coverage at [email or web address], password [facility] 02/11/2012, 9:15 AM  LOS: 1 day   Brief narrative: 66 [facility] sustained a fall 12/29 was transported to the emergency department for further evaluation. History of ataxia. Workup was positive for strep the patient admitted for further evaluation  Consultants:  Neurology  Physical therapy: Outpatient PT.  Occupational therapy: No followup needed. 24/7 supervision recommended.  Procedures:  2-D echocardiogram  Carotid duplex: No evidence of hemodynamically significant internal carotid artery  stenosis. Vertebral artery flow is antegrade.   HPI/Subjective: No pain, still unsteady on feet.  Objective: Filed Vitals:   02/10/12 2314 02/11/12 0145 02/11/12 0557 02/11/12 0615  BP: 105/50 106/43 105/33 128/74  Pulse: 71 66 70   Temp: 98.4 F (36.9 C) 98.1 F (36.7 C) 98.1 F (36.7 C)   TempSrc: Oral Oral Oral   Resp: 18 18 18    Height:      Weight:      SpO2: 100% 100% 97%     Intake/Output Summary (Last 24 hours) at 02/11/12 0915 Last data filed at 02/10/12 2150  Gross per 24 hour  Intake      0 ml  Output      2 ml  Net     -2 ml   Filed Weights   02/10/12 2112  Weight: 56.972 kg (125 lb 9.6 oz)    Exam:  General:  Appears calm and comfortable. Laceration over left eye with dried blood. Cardiovascular: RRR, no m/r/g. No LE edema. Telemetry: SR, no arrhythmias  Respiratory: CTA bilaterally, no w/r/r. Normal respiratory effort. Psychiatric: grossly normal mood and affect, speech fluent and appropriate  Data Reviewed: Basic Metabolic Panel:  Lab 02/10/12 1324 02/07/12 0627 02/05/12 1947  NA 144 148* 144  K 3.3* 3.6 3.5  CL 110 113* 112  CO2 26 25 24   GLUCOSE 120* 122* 174*  BUN 15 11 9   CREATININE 1.23* 1.22* 1.33*  CALCIUM 10.7* 11.6* 10.8*  MG -- -- --  PHOS -- -- --   Liver Function Tests:  Lab 02/05/12 1947  AST 66*  ALT 35  ALKPHOS 71  BILITOT 0.4  PROT 7.1  ALBUMIN 3.5   CBC:  Lab 02/10/12 0243 02/05/12 1947  WBC 7.2 7.6  NEUTROABS 5.7 6.1  HGB 11.9* 12.1  HCT 38.0 38.3  MCV 99.0 98.2  PLT 218 283  CBG:  Lab 02/06/12 1027  GLUCAP 99     Studies: Dg Chest 2 View  02/10/2012  *RADIOLOGY REPORT*  Clinical Data: Stroke.  Fall  CHEST - 2 VIEW  Comparison: None  Findings: Heart size is normal.  No pleural effusion identified. Plate-like atelectasis versus scarring is noted in both lung bases. No airspace consolidation.  The visualized osseous structures are unremarkable.  IMPRESSION:  1.  Bibasilar scar versus plate-like  atelectasis.   Original Report Authenticated By: Signa Kell, M.D.    Mr Brain Wo Contrast  02/10/2012  *RADIOLOGY REPORT*  Clinical Data: Falling.  Head trauma.  MRI HEAD WITHOUT CONTRAST  Technique:  Multiplanar, multiecho pulse sequences of the brain and surrounding structures were obtained according to standard protocol without intravenous contrast.  Comparison: Head CT 02/09/2012  Findings: The brain shows a background pattern of atrophy with mild chronic small vessel change affecting the deep and subcortical white matter.  In the white matter of the right frontal lobe, there are punctate foci of signal abnormality at this show restricted diffusion, increased T2 signal and perhaps minimal susceptibility. In this setting of fall with frontal head trauma, I think this is most likely to represent a cluster of small shear injuries.  The possibility that these could represent ischemic infarctions cannot be excluded but is not favored.  There is no blood collection that one would described as a hematoma.  No sign of neoplastic mass lesion, hydrocephalus or extra-axial collection.  No pituitary mass.  No fluid in the sinuses.  Frontal scalp injury is evident on the left.  IMPRESSION: Cluster of small abnormal foci in the right frontal white matter that I favor represent  shear injuries related to the fall and head trauma.  See above discussion.  The possibility exists that these could be acute subcortical ischemic infarctions, but that is not favored.   Original Report Authenticated By: Paulina Fusi, M.D.     Scheduled Meds:   . aspirin EC  81 mg Oral Daily  . clopidogrel  75 mg Oral Q breakfast  . enoxaparin (LOVENOX) injection  40 mg Subcutaneous Q24H  . levothyroxine  125 mcg Oral QAC breakfast  . lithium carbonate  450 mg Oral BID  . lurasidone  80 mg Oral Q supper  . oxcarbazepine  600 mg Oral QHS  . traZODone  50 mg Oral QHS   Continuous Infusions:   . 0.9 % NaCl with KCl 20 mEq / L 100  mL/hr at 02/11/12 1610    Principal Problem:  *Ataxia Active Problems:  Hypotension, postural  CVA (cerebral infarction)  Bipolar 1 disorder  Hypothyroidism     Brendia Sacks, MD  [facility] Team 4  Pager [phone] If 7PM-7AM, please contact night-coverage at [email or web address], password [facility] 02/11/2012, 9:15 AM  LOS: 1 day   Time spent: 20 minutes

## 2012-02-11 NOTE — Progress Notes (Signed)
  Echocardiogram 2D Echocardiogram has been performed.  [place] 02/11/2012, 11:11 AM
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
## 2012-02-11 NOTE — Progress Notes (Signed)
Stroke Team Progress Note  HISTORY  CC: Falls  History is obtained from:Patient   HPI: [name] is a 66 y.o. female with a history of repeated falls over the past few weeks to month. She states that when she is up she will walk a few steps, then get lightheaded and her legs will go out from under her. This has caused her to fall and injure herself multiple times. She also has not been eating or drinking well. She fell on 12/24 with a possible LOC, though this is unclear and also gave herself a laceration over her left eye last night. She has had some nausea and vomiting after her head injury on 12/24. She had an MRI which shows a subacute diffusion change in her right frontal area which could represent traumatic injury or subacute infarction. She had a CTA head and CT on 12/18 which was before her serious falls and this lesion is present at that time as well.   She feels like it may be improving slightly, though she still does not feel stable by herself. She is currently admitted to behavioral health for symptoms similar to previous manic episodes and had been off of her lithium due to possible side effects.    She was admitted to the neuro ICU floor for further evaluation and treatment.  SUBJECTIVE  Overall she feels her condition is rapidly improving. She says that she gets weak spells and sometimes does not remember things clearly. She feels weak today. Denies sz history  OBJECTIVE Most recent Vital Signs: Filed Vitals:   02/10/12 2314 02/11/12 0145 02/11/12 0557 02/11/12 0615  BP: 105/50 106/43 105/33 128/74  Pulse: 71 66 70   Temp: 98.4 F (36.9 C) 98.1 F (36.7 C) 98.1 F (36.7 C)   TempSrc: Oral Oral Oral   Resp: 18 18 18    Height:      Weight:      SpO2: 100% 100% 97%    CBG (last 3)  No results found for this basename: GLUCAP:3 in the last 72 hours  IV Fluid Intake:     . 0.9 % NaCl with KCl 20 mEq / L 100 mL/hr at 02/11/12 0351    MEDICATIONS    . aspirin EC   81 mg Oral Daily  . clopidogrel  75 mg Oral Q breakfast  . enoxaparin (LOVENOX) injection  40 mg Subcutaneous Q24H  . levothyroxine  125 mcg Oral QAC breakfast  . lithium carbonate  450 mg Oral BID  . lurasidone  80 mg Oral Q supper  . oxcarbazepine  600 mg Oral QHS  . traZODone  50 mg Oral QHS   PRN:  senna-docusate  Diet:  General thin liquids Activity:  Activity as tolerated DVT Prophylaxis:  lovenox  CLINICALLY SIGNIFICANT STUDIES Basic Metabolic Panel:  Lab 02/10/12 1610 02/07/12 0627  NA 144 148*  K 3.3* 3.6  CL 110 113*  CO2 26 25  GLUCOSE 120* 122*  BUN 15 11  CREATININE 1.23* 1.22*  CALCIUM 10.7* 11.6*  MG -- --  PHOS -- --   Liver Function Tests:  Lab 02/05/12 1947  AST 66*  ALT 35  ALKPHOS 71  BILITOT 0.4  PROT 7.1  ALBUMIN 3.5   CBC:  Lab 02/10/12 0243 02/05/12 1947  WBC 7.2 7.6  NEUTROABS 5.7 6.1  HGB 11.9* 12.1  HCT 38.0 38.3  MCV 99.0 98.2  PLT 218 283   Coagulation: No results found for this basename: LABPROT:4,INR:4 in the  last 168 hours Cardiac Enzymes: No results found for this basename: CKTOTAL:3,CKMB:3,CKMBINDEX:3,TROPONINI:3 in the last 168 hours Urinalysis: No results found for this basename: COLORURINE:2,APPERANCEUR:2,LABSPEC:2,PHURINE:2,GLUCOSEU:2,HGBUR:2,BILIRUBINUR:2,KETONESUR:2,PROTEINUR:2,UROBILINOGEN:2,NITRITE:2,LEUKOCYTESUR:2 in the last 168 hours Lipid Panel    Component Value Date/Time   CHOL 201* 02/11/2012 0745   TRIG 183* 02/11/2012 0745   HDL 35* 02/11/2012 0745   CHOLHDL 5.7 02/11/2012 0745   VLDL 37 02/11/2012 0745   LDLCALC 129* 02/11/2012 0745   HgbA1C  Lab Results  Component Value Date   HGBA1C 6.1* 01/29/2012    Urine Drug Screen:     Component Value Date/Time   LABOPIA NONE DETECTED 01/29/2012 1729   COCAINSCRNUR NONE DETECTED 01/29/2012 1729   LABBENZ NONE DETECTED 01/29/2012 1729   AMPHETMU NONE DETECTED 01/29/2012 1729   THCU NONE DETECTED 01/29/2012 1729   LABBARB NONE DETECTED 01/29/2012 1729      TSH= 0.063  Alcohol Level: No results found for this basename: ETH:2 in the last 168 hours  Dg Chest 2 View Bibasilar scar versus plate-like atelectasis.    CT of the brain  No evidence of traumatic intracranial injury or fracture. Soft tissue laceration just superior to the left orbit. Scattered small vessel ischemic microangiopathy.   MRI of the brain Cluster of small abnormal foci in the right frontal white matter that I favor represent shear injuries related to the fall and head trauma. See above discussion. The possibility exists that these could be acute subcortical ischemic infarctions, but that is not  favored.  MRA of the brain    2D Echocardiogram    Carotid Doppler  No evidence of hemodynamically significant internal carotid artery stenosis. Vertebral artery flow is antegrade.   CXR    EEG  EKG  normal sinus rhythm.   Therapy Recommendations outpatient (passed swallow)   Physical Exam    Mental Status:  Patient is awake, alert, oriented to person, place, month, year(though initially gives 2014, then corrects herself), and situation.  Immediate and remote memory are intact.  Patient is able to give a clear and coherent history.  Able to add 7+5  Able to give # of quarters in $2.75  No signs of aphasia.  Cranial Nerves:  II: Visual Fields are full. Pupils are equal, round, and reactive to light. Discs are difficult to visualize.  III,IV, VI: EOMI without ptosis or diploplia.  V: Facial sensation is symmetric to temperature  VII: Facial movement is symmetric.  VIII: hearing is intact to voice  X: Uvula elevates symmetrically  XI: Shoulder shrug is symmetric.  XII: tongue is midline without atrophy or fasciculations.  Motor:  Tone is normal. Bulk is normal. 5/5 strength was present in all four extremities.  Sensory:  Sensation is symmetric to light touch and temperature in the arms and legs.  Deep Tendon Reflexes:  2+ and symmetric in the biceps and patellae.   Cerebellar:  FNF and HKS are intact bilaterally  Gait:  Patient is able to stand, but requires assistence to walk, feels lightheaded and so full evaluation was not completed     ASSESSMENT [name] is a 66 y.o. female presenting with pre-syncope, head injury, acute delirium, falls due to gait disorder . MR head shows  A cluster of small abnormal foci in the right frontal white matter that may  represent shear injuries related to the fall and head trauma.  On no anticoagulants prior to admission. Now on aspirin 81 mg orally every day and clopidogrel 75 mg orally every day for secondary  stroke prevention. Will continue stroke workup but will start seizure workup as well. Patient on mental health medications which have been unchanged, but could cause vertigo/falling sensations as well.   Gait disorder with falls felt secondary to dehydration, poor calorie intake  Head trauma   Abnormal CT head of right frontal white matter representing shear injuries consistent with fall/trauma. MR ordered to clearly define.  Hyperlipidemia, LDL 129, goal <100 in non diabetic, < 70 in diabetic. Not on statin that I can tell from home.  Former smoker  Long term medication use  Hypothyroidism, now tsh low, per primary team.   Hospital day # 1  TREATMENT/PLAN  Change to Aspirin 325mg  daily only for secondary stroke prevention.  EEG, due to falls with some confusion/symptoms peri-fall. Concern for seizure  Hydrate  Would add on statin; however, AST has gradually increased, would prove downward trend prior to statin start. Recheck cmet in am.  Gwendolyn Lima. Manson Passey, [facility], MBA, MHA [facility] Pager: [phone] 02/11/2012 9:31 AM  I have personally obtained a history, examined the patient, evaluated imaging results, and formulated the assessment and plan of care. I agree with the above.  Delia Heady, MD Medical Director [facility] Pager:  [phone] 02/12/2012 11:30 AM

## 2012-02-11 NOTE — Evaluation (Signed)
SLP Cancellation Note  Patient Details Name: [name] MRN: [id number] DOB: [birth year]   Cancelled treatment:       Reason Eval/Treat Not Completed: Patient at procedure or test/unavailable (pt getting ready for ? doppler?)   Donavan Burnet, MS [facility] SLP [phone]

## 2012-02-11 NOTE — Progress Notes (Signed)
Clinical Social Work  CSW staffed case with psych MD who reports that patient has capacity to make her own decisions and if she does not want to return to [facility] then she can follow up with [facility]. CSW spoke with [facility] Minerva Areola) at [facility] and updated him on psych MD recommendations and that patient does not want to return. CSW will continue to follow.  [place], [place] [phone]

## 2012-02-11 NOTE — Procedures (Addendum)
History: 66 yo F with intermittent episodes of lightheadedness accompanied by falls.   Sedation: none  Background: There is a well defined posterior dominant rhythm of 9 Hz that attenuates with eye opening. The predominant state in this EEG is sleep and drowsiness. There is irregular delta assciated with these states, however during periods of maximal wakefullness this is not seen.   Photic stimulation: Physiologic driving is not performed  EEG Diagnosis: Normal EEG  Clinical Interpretation: This normal EEG is recorded in the waking and sleep state. There was no seizure or seizure predisposition recorded on this study.   Ritta Slot, MD [facility] [phone]  If 7pm- 7am, please page neurology on call at [phone].

## 2012-02-12 LAB — BASIC METABOLIC PANEL
Calcium: 11.8 mg/dL — ABNORMAL HIGH (ref 8.4–10.5)
Chloride: 122 mEq/L — ABNORMAL HIGH (ref 96–112)
Creatinine, Ser: 1.32 mg/dL — ABNORMAL HIGH (ref 0.50–1.10)
GFR calc Af Amer: 48 mL/min — ABNORMAL LOW (ref >90)
Sodium: 156 mEq/L — ABNORMAL HIGH (ref 135–145)

## 2012-02-12 LAB — COMPREHENSIVE METABOLIC PANEL
Alkaline Phosphatase: 80 U/L (ref 39–117)
BUN: 13 mg/dL (ref 6–23)
Creatinine, Ser: 1.23 mg/dL — ABNORMAL HIGH (ref 0.50–1.10)
GFR calc Af Amer: 53 mL/min — ABNORMAL LOW (ref >90)
Glucose, Bld: 130 mg/dL — ABNORMAL HIGH (ref 70–99)
Potassium: 3.7 mEq/L (ref 3.5–5.1)
Total Bilirubin: 0.4 mg/dL (ref 0.3–1.2)
Total Protein: 7.8 g/dL (ref 6.0–8.3)

## 2012-02-12 MED ORDER — DEXTROSE 5 % IV SOLN
INTRAVENOUS | Status: AC
  Administered 2012-02-12: 19:00:00 via INTRAVENOUS

## 2012-02-12 MED ORDER — ASPIRIN 325 MG PO TABS
325.0000 mg | ORAL_TABLET | Freq: Every day | ORAL | Status: DC
  Administered 2012-02-13: 325 mg via ORAL
  Filled 2012-02-12: qty 1

## 2012-02-12 NOTE — Evaluation (Signed)
Speech Language Pathology Evaluation Patient Details Name: [name] MRN: [id number] DOB: [birth year] Today's Date: 02/12/2012 Time: 7829-5621 SLP Time Calculation (min): 15 min  Problem List:  Patient Active Problem List  Diagnosis  . Bipolar 1 disorder, manic, moderate  . Hypotension, postural  . CVA (cerebral infarction)  . Ataxia  . Bipolar 1 disorder  . Hypothyroidism   Past Medical History: History reviewed. No pertinent past medical history. Past Surgical History: History reviewed. No pertinent past surgical history. HPI:  [name] is a 66 y.o. female with a history of repeated falls over the past few weeks to month. She states that when she is up she will walk a few steps, then get lightheaded and her legs will go out from under her. This has caused her to fall and injure herself multiple times. She also has not been eating or drinking well. She fell on 12/24 with a possible LOC, though this is unclear and also gave herself a laceration over her left eye last night. She has had some nausea and vomiting after her head injury on 12/24. She had an MRI which shows a subacute diffusion change in her right frontal area which could represent traumatic injury or subacute infarction. She had a CTA head and CT on 12/18 which was before her serious falls and this lesion is present at that time as well.    Assessment / Plan / Recommendation Clinical Impression  Patient presents with impairements with high level reasoning, questionably related to ? acute CVA vs concussion although impairements previously given psych history cannot be excluded. Communication of needs and wants and problem solving for basic tasks [facility]. No further acute SLP f/u indicated. Educated patient however regarding cognitive impairements following CVA/TBI and recommended OP f/u for high level cognition. Patient in agreement.     SLP Assessment  All further Speech Lanaguage Pathology  needs can be addressed in  the next venue of care    Follow Up Recommendations  Outpatient SLP       Pertinent Vitals/Pain n/a   SLP Goals  SLP Goals Progress/Goals/Alternative treatment plan discussed with pt/caregiver and they: Agree  SLP Evaluation Prior Functioning  Cognitive/Linguistic Baseline: Baseline deficits (bipolar d/o) Type of Home: House Lives With: Spouse Available Help at Discharge: Family Vocation: Retired   IT consultant  Overall Cognitive Status: Impaired Arousal/Alertness: Awake/alert Orientation Level: Oriented X4 Executive Function: Reasoning Reasoning: Impaired Reasoning Impairment: Verbal complex    Comprehension  Auditory Comprehension Overall Auditory Comprehension: Appears within functional limits for tasks assessed Visual Recognition/Discrimination Discrimination: Within Function Limits Reading Comprehension Reading Status: Within funtional limits    Expression Expression Primary Mode of Expression: Verbal Verbal Expression Overall Verbal Expression: Appears within functional limits for tasks assessed Written Expression Dominant Hand: Right   Oral / Motor Oral Motor/Sensory Function Overall Oral Motor/Sensory Function: Appears within functional limits for tasks assessed Motor Speech Overall Motor Speech: Appears within functional limits for tasks assessed   GO Functional Assessment Tool Used: skilled clinical judgement Functional Limitations: Attention Attention Current Status (H0865): At least 1 percent but less than 20 percent impaired, limited or restricted Attention Goal Status (H8469): At least 1 percent but less than 20 percent impaired, limited or restricted Attention Discharge Status [id number]): At least 1 percent but less than 20 percent impaired, limited or restricted   Barbara [facility] MA, CCC-SLP [phone]  [name] [name] 02/12/2012, 10:51 AM

## 2012-02-12 NOTE — Progress Notes (Signed)
Stroke Team Progress Note  HISTORY  CC: Falls  History is obtained from:Patient   HPI: [name] is a 66 y.o. female with a history of repeated falls over the past few weeks to month. She states that when she is up she will walk a few steps, then get lightheaded and her legs will go out from under her. This has caused her to fall and injure herself multiple times. She also has not been eating or drinking well. She fell on 12/24 with a possible LOC, though this is unclear and also gave herself a laceration over her left eye last night. She has had some nausea and vomiting after her head injury on 12/24. She had an MRI which shows a subacute diffusion change in her right frontal area which could represent traumatic injury or subacute infarction. She had a CTA head and CT on 12/18 which was before her serious falls and this lesion is present at that time as well.   She feels like it may be improving slightly, though she still does not feel stable by herself. She is currently admitted to behavioral health for symptoms similar to previous manic episodes and had been off of her lithium due to possible side effects.    She was admitted to the neuro ICU floor for further evaluation and treatment.  SUBJECTIVE Patient feels condition has improved. Denies any seizure activity.  OBJECTIVE Most recent Vital Signs: Filed Vitals:   02/11/12 1400 02/11/12 2107 02/12/12 0226 02/12/12 0547  BP: 112/42 118/55 116/40 122/49  Pulse: 90 70 68 65  Temp:  98.8 F (37.1 C) 98.5 F (36.9 C) 98 F (36.7 C)  TempSrc:  Oral Oral Oral  Resp: 20 18 18 18   Height:      Weight:      SpO2: 96% 100% 98% 99%   CBG (last 3)  No results found for this basename: GLUCAP:3 in the last 72 hours  IV Fluid Intake:     MEDICATIONS     . clopidogrel  75 mg Oral Q breakfast  . enoxaparin (LOVENOX) injection  40 mg Subcutaneous Q24H  . levothyroxine  100 mcg Oral QAC breakfast  . lurasidone  80 mg Oral Q supper  .  oxcarbazepine  600 mg Oral QHS  . traZODone  50 mg Oral QHS   PRN:  senna-docusate  Diet:  General thin liquids Activity:  Activity as tolerated DVT Prophylaxis:  lovenox  CLINICALLY SIGNIFICANT STUDIES Basic Metabolic Panel:   Lab 02/12/12 0615 02/10/12 0243  NA 158* 144  K 3.7 3.3*  CL 127* 110  CO2 21 26  GLUCOSE 130* 120*  BUN 13 15  CREATININE 1.23* 1.23*  CALCIUM 11.4* 10.7*  MG -- --  PHOS -- --   Liver Function Tests:   Lab 02/12/12 0615 02/05/12 1947  AST 75* 66*  ALT 49* 35  ALKPHOS 80 71  BILITOT 0.4 0.4  PROT 7.8 7.1  ALBUMIN 3.7 3.5   CBC:   Lab 02/10/12 0243 02/05/12 1947  WBC 7.2 7.6  NEUTROABS 5.7 6.1  HGB 11.9* 12.1  HCT 38.0 38.3  MCV 99.0 98.2  PLT 218 283   Coagulation: No results found for this basename: LABPROT:4,INR:4 in the last 168 hours Cardiac Enzymes: No results found for this basename: CKTOTAL:3,CKMB:3,CKMBINDEX:3,TROPONINI:3 in the last 168 hours Urinalysis: No results found for this basename: COLORURINE:2,APPERANCEUR:2,LABSPEC:2,PHURINE:2,GLUCOSEU:2,HGBUR:2,BILIRUBINUR:2,KETONESUR:2,PROTEINUR:2,UROBILINOGEN:2,NITRITE:2,LEUKOCYTESUR:2 in the last 168 hours Lipid Panel    Component Value Date/Time   CHOL 201* 02/11/2012 0745  TRIG 183* 02/11/2012 0745   HDL 35* 02/11/2012 0745   CHOLHDL 5.7 02/11/2012 0745   VLDL 37 02/11/2012 0745   LDLCALC 129* 02/11/2012 0745   HgbA1C  Lab Results  Component Value Date   HGBA1C 6.2* 02/11/2012    Urine Drug Screen:     Component Value Date/Time   LABOPIA NONE DETECTED 01/29/2012 1729   COCAINSCRNUR NONE DETECTED 01/29/2012 1729   LABBENZ NONE DETECTED 01/29/2012 1729   AMPHETMU NONE DETECTED 01/29/2012 1729   THCU NONE DETECTED 01/29/2012 1729   LABBARB NONE DETECTED 01/29/2012 1729    TSH= 0.063  Alcohol Level: No results found for this basename: ETH:2 in the last 168 hours  Dg Chest 2 View Bibasilar scar versus plate-like atelectasis.    CT of the brain  No evidence of  traumatic intracranial injury or fracture. Soft tissue laceration just superior to the left orbit. Scattered small vessel ischemic microangiopathy.   MRI of the brain Cluster of small abnormal foci in the right frontal white matter that I favor represent shear injuries related to the fall and head trauma. See above discussion. The possibility exists that these could be acute subcortical ischemic infarctions, but that is not  favored.  2D Echocardiogram  EF 55%, wall motion normal, normal LA size,   Carotid Doppler  No evidence of hemodynamically significant internal carotid artery stenosis. Vertebral artery flow is antegrade.   CXR  Bibasilar scar versus plate-like atelectasis  EEG This normal EEG is recorded in the waking and sleep state. There was no seizure or seizure predisposition recorded on this study  EKG  normal sinus rhythm.   Therapy Recommendations outpatient (passed swallow)    Physical Exam    Mental Status:  Patient is awake, alert, oriented to person, place, month, year(though initially gives 2014, then corrects herself), and situation.  Immediate and remote memory are intact.  Patient is able to give a clear and coherent history.  Able to add 7+5  Able to give # of quarters in $2.75  No signs of aphasia.  Cranial Nerves:  II: Visual Fields are full. Pupils are equal, round, and reactive to light. Discs are difficult to visualize.  III,IV, VI: EOMI without ptosis or diploplia.  V: Facial sensation is symmetric to temperature  VII: Facial movement is symmetric.  VIII: hearing is intact to voice  X: Uvula elevates symmetrically  XI: Shoulder shrug is symmetric.  XII: tongue is midline without atrophy or fasciculations.  Motor:  Tone is normal. Bulk is normal. 5/5 strength was present in all four extremities.  Sensory:  Sensation is symmetric to light touch and temperature in the arms and legs.  Deep Tendon Reflexes:  2+ and symmetric in the biceps and patellae.   Cerebellar:  FNF and HKS are intact bilaterally  Gait:  Patient is able to stand, but requires assistence to walk, feels lightheaded and so full evaluation was not completed     ASSESSMENT [name] is a 66 y.o. female presenting with pre-syncope, head injury, acute delirium, falls due to gait disorder . MR head shows  A cluster of small abnormal foci in the right frontal white matter that may  represent shear injuries related to the fall and head trauma.  On no anticoagulants prior to admission. Now on aspirin 81 mg orally every day and clopidogrel 75 mg orally every day for secondary stroke prevention. Patient on mental health medications which have been unchanged, but could cause vertigo/falling sensations as well.  Gait disorder with falls felt secondary to dehydration, poor calorie intake  Head trauma   Abnormal CT head of right frontal white matter representing shear injuries consistent with fall/trauma. MR ordered to clearly define.  Hyperlipidemia, LDL 129, goal <100 in non diabetic, < 70 in diabetic. Not on statin that I can tell from home.  Former smoker  Long term medication use  Hypothyroidism, now tsh low, per primary team.  Hypernatremia, off hydration. 158, repeat sodium as this number seems exponentially high.   Hospital day # 2  TREATMENT/PLAN  Change to Aspirin 325mg  daily only for secondary stroke prevention.  EEG, documents no evidence of seizure activity at present  Would add on statin; however, AST/ALT have gradually increased, would prove downward trend prior to statin start. Could be medication related. Will refer back to medical team for further evaluation.  Stroke service will sign off. Follow up with Dr. Pearlean Brownie in 2 months.  Gwendolyn Lima. Manson Passey, [facility], MBA, MHA [facility] Pager: [phone] 02/12/2012 8:04 AM  I have personally obtained a history, examined the patient, evaluated imaging results, and formulated the assessment  and plan of care. I agree with the above.   Delia Heady, MD Medical Director [facility] Pager: [phone] 02/14/2012 4:36 PM

## 2012-02-12 NOTE — Progress Notes (Signed)
Occupational Therapy Treatment Patient Details Name: [name] MRN: [id number] DOB: 17-Jun-1945 Today's Date: 02/12/2012 Time: 4098-1191 OT Time Calculation (min): 17 min  OT Assessment / Plan / Recommendation    Follow Up Recommendations  No OT follow up;Supervision/Assistance - 24 hour       Equipment Recommendations  None recommended by OT       Frequency Min 2X/week      Precautions / Restrictions Precautions Precautions: Fall       ADL  Transfers/Ambulation Related to ADLs: Pt overall min a with functional transfers and transitions.  Pt wanted some apple sauce so pt did walk to nutrition room with min a to obtain apple sauce incorporating head turns, turning , etc.  Edcuate pt on importance of calling for assist when she needs to get up to prevent falls      OT Goals    Visit Information  Last OT Received On: 02/12/12    Subjective Data  Subjective: i would like to have some applesauce   Prior Functioning       Cognition  Overall Cognitive Status: Appears within functional limits for tasks assessed/performed Arousal/Alertness: Awake/alert Orientation Level: Appears intact for tasks assessed Behavior During Session: [facility] for tasks performed    Mobility  Shoulder Instructions Bed Mobility Bed Mobility: Supine to Sit;Sitting - Scoot to Edge of Bed Supine to Sit: 6: Modified independent (Device/Increase time);HOB flat Sitting - Scoot to Edge of Bed: 6: Modified independent (Device/Increase time) Transfers Sit to Stand: 5: Supervision;With upper extremity assist;From bed Stand to Sit: 5: Supervision;With upper extremity assist;With armrests;To chair/3-in-1 Details for Transfer Assistance: pt demonstrates safe technique.               End of Session OT - End of Session Equipment Utilized During Treatment: Gait belt Activity Tolerance: Patient tolerated treatment well Patient left: in bed;with call bell/phone within reach Nurse Communication:  Mobility status  GO     Alba Cory 02/12/2012, 2:06 PM

## 2012-02-12 NOTE — Progress Notes (Addendum)
[facility] PROGRESS NOTE  MARLETA LAPIERRE HQI:[id number] DOB: [birth year] DOA: 02/10/2012 PCP: Alva Garnet., MD  Assessment/Plan: 1. Gait disorder/Ataxia with several falls and head injuries: Mild concussion suspected. Per Dr. Pearlean Brownie (discuss personally), stroke is doubted, lesions are thought to be representative of injuries from fall.. Complete Stroke workup. EEG per neurology to rule out seizure. Aspirin for secondary stroke prevention per neurology. Will need outpatient neurology followup for further evaluation. Followup with Dr. Pearlean Brownie in 2 months. 2. Dizziness: Suspect secondary to orthostasis. Clinically resolved. 3. Hypercalcemia: Patient reports that she has been told about this in the past. This would be presumably from long-term with lithium use. See discussion about TSH below. Not on a thiazide diuretic. Patient appears to be asymptomatic and further evaluation can be conducted in the outpatient setting to exclude other causes. Check intact PTH. I have been unable to get in touch with her primary care physician today. 4. Hypernatremia, hyperchloremia: Surprising as both values were previously normal 48 hours ago. May be artifact or secondary to normal saline given over that time. Recheck basic metabolic panel. Encourage water intake and recheck laboratory studies in the morning if they are improved potentially discharge home. 5. Suspected chronic kidney disease stage III: Appears stable. 6. Bipolar disorder, manic with psychotic features: Off lithium at [facility] secondary to falls. Not lithium level was actually low. Per psychiatry continue Latuda and trazodone 50 mg for sleep 7. Hypothyroidism: Continue Synthroid. TSH level low, strange considering recent level was high December 17. I cannot tell from documentation whether she has had a dosage change or not. Decreased Synthroid dose, recheck in 4 weeks. 8. Recent head laceration prior to admission 12/28: Stitches out January 2.  Vaseline to moisturize and remove blood.  Code Status: Full code Family Communication: None present Disposition Plan: Return to [facility], likely 12/31.  Brendia Sacks, MD  [facility] Team 4  Pager [phone] If 7PM-7AM, please contact night-coverage at [email or web address], password [email or web address] 02/12/2012, 3:43 PM  LOS: 2 days   Brief narrative: 66 [facility] disorder sustained a fall 12/29 was transported to the emergency department for further evaluation. History of ataxia. Workup was positive for strep the patient admitted for further evaluation  Consultants:  Neurology  Psychiatry: Patient has capacity to decline treatment.  Physical therapy: Outpatient PT.  Speech therapy: Outpatient SLP  Occupational therapy: No followup needed. 24/7 supervision recommended.  Procedures:  2-D echocardiogram: The cavity size was normal. Systolic function was normal. The estimated ejection fraction was in the range of 55% to 60%. Wall motion was normal; there were no regional wall motion abnormalities. Doppler parameters are consistent with abnormal left ventricular relaxation (grade 1 diastolic dysfunction).  Carotid duplex: No evidence of hemodynamically significant internal carotid artery stenosis. Vertebral artery flow is antegrade.   EEG: Normal.  HPI/Subjective: Feels better. Still a little unsteady on her feet.  Objective: Filed Vitals:   02/12/12 0226 02/12/12 0547 02/12/12 1056 02/12/12 1335  BP: 116/40 122/49 112/45 109/80  Pulse: 68 65 68 72  Temp: 98.5 F (36.9 C) 98 F (36.7 C) 97.9 F (36.6 C) 98.5 F (36.9 C)  TempSrc: Oral Oral Oral Oral  Resp: 18 18 16 16   Height:      Weight:      SpO2: 98% 99% 97% 98%   No intake or output data in the 24 hours ending 02/12/12 1543 Filed Weights   02/10/12 2112  Weight: 56.972 kg (125 lb 9.6 oz)  Exam:  General:  Appears calm and comfortable. Laceration over left eye with less  dried blood. Cardiovascular: RRR, no m/r/g. No LE edema. Respiratory: CTA bilaterally, no w/r/r. Normal respiratory effort. Psychiatric: grossly normal mood and affect, speech fluent and appropriate  Exam current 12/31  Data Reviewed: Basic Metabolic Panel:  Lab 02/12/12 1610 02/10/12 0243 02/07/12 0627 02/05/12 1947  NA 158* 144 148* 144  K 3.7 3.3* 3.6 3.5  CL 127* 110 113* 112  CO2 21 26 25 24   GLUCOSE 130* 120* 122* 174*  BUN 13 15 11 9   CREATININE 1.23* 1.23* 1.22* 1.33*  CALCIUM 11.4* 10.7* 11.6* 10.8*  MG -- -- -- --  PHOS -- -- -- --   Liver Function Tests:  Lab 02/12/12 0615 02/05/12 1947  AST 75* 66*  ALT 49* 35  ALKPHOS 80 71  BILITOT 0.4 0.4  PROT 7.8 7.1  ALBUMIN 3.7 3.5   CBC:  Lab 02/10/12 0243 02/05/12 1947  WBC 7.2 7.6  NEUTROABS 5.7 6.1  HGB 11.9* 12.1  HCT 38.0 38.3  MCV 99.0 98.2  PLT 218 283   CBG:  Lab 02/06/12 1027  GLUCAP 99     Studies: No results found.  Scheduled Meds:    . clopidogrel  75 mg Oral Q breakfast  . enoxaparin (LOVENOX) injection  40 mg Subcutaneous Q24H  . levothyroxine  100 mcg Oral QAC breakfast  . lurasidone  80 mg Oral Q supper  . oxcarbazepine  600 mg Oral QHS  . traZODone  50 mg Oral QHS   Continuous Infusions:   Principal Problem:  *Ataxia Active Problems:  Hypotension, postural  CVA (cerebral infarction)  Bipolar 1 disorder  Hypothyroidism     Brendia Sacks, MD  [facility] Team 4  Pager [phone] If 7PM-7AM, please contact night-coverage at [email or web address], password [facility] 02/12/2012, 3:43 PM  LOS: 2 days   Time spent: 20 minutes

## 2012-02-12 NOTE — Progress Notes (Signed)
Physical Therapy Treatment Patient Details Name: [name] MRN: [id number] DOB: 29-May-1945 Today's Date: 02/12/2012 Time: 4098-1191 PT Time Calculation (min): 14 min  PT Assessment / Plan / Recommendation Comments on Treatment Session  Pt progressing well with mobility at this date.  During gait training, pt reports she sees "stars" but they subside immediately with brief standing rest break.  Pt states she will have 24 hr (A)/(S) at home from her husband.      Follow Up Recommendations  Outpatient PT;Supervision/Assistance - 24 hour     Does the patient have the potential to tolerate intense rehabilitation     Barriers to Discharge        Equipment Recommendations       Recommendations for Other Services    Frequency Min 4X/week   Plan Discharge plan remains appropriate    Precautions / Restrictions Precautions Precautions: Fall Restrictions Weight Bearing Restrictions: No       Mobility  Bed Mobility Bed Mobility: Supine to Sit;Sitting - Scoot to Edge of Bed Supine to Sit: 6: Modified independent (Device/Increase time);HOB flat Sitting - Scoot to Edge of Bed: 6: Modified independent (Device/Increase time) Transfers Transfers: Sit to Stand;Stand to Sit Sit to Stand: 5: Supervision;With upper extremity assist;From bed Stand to Sit: 5: Supervision;With upper extremity assist;With armrests;To chair/3-in-1 Details for Transfer Assistance: pt demonstrates safe technique.   Ambulation/Gait Ambulation/Gait Assistance: 4: Min guard Ambulation Distance (Feet): 300 Feet Assistive device: Straight cane;None Ambulation/Gait Assistance Details: Trialed with cane initially but pt minimally using cane & carrying cane in air, therefore removed cane & pt ambulated with no AD.  Pt with small shuffle-like steps but able to increase stride & take more functional steps when cued.  Pt reports she sees "stars" but they subside immediately with standing rest break, pt also reports  "stars" are not present when ambulating in direction of sunshine.   Gait Pattern: Step-through pattern;Decreased stride length (decreased step height) Stairs: No Wheelchair Mobility Wheelchair Mobility: No      PT Goals Acute Rehab PT Goals PT Goal Formulation: With patient Time For Goal Achievement: 02/17/12 Potential to Achieve Goals: Fair Pt will go Sit to Stand: with supervision PT Goal: Sit to Stand - Progress: Met Pt will go Stand to Sit: with supervision PT Goal: Stand to Sit - Progress: Met Pt will Transfer Bed to Chair/Chair to Bed: with supervision Pt will Ambulate: >150 feet;with supervision;with least restrictive assistive device PT Goal: Ambulate - Progress: Progressing toward goal Pt will Go Up / Down Stairs: 3-5 stairs;with rail(s);with supervision  Visit Information  Last PT Received On: 02/12/12 Assistance Needed: +1    Subjective Data      Cognition  Overall Cognitive Status: Appears within functional limits for tasks assessed/performed Arousal/Alertness: Awake/alert Orientation Level: Appears intact for tasks assessed Behavior During Session: [facility] for tasks performed    Balance  Balance Balance Assessed: Yes High Level Balance High Level Balance Activites: Side stepping;Backward walking;Direction changes;Turns;Head turns;Other (comment) (tandem walking) High Level Balance Comments: Pt able to perform activities with min (A) to min guard (A) needed.    End of Session PT - End of Session Equipment Utilized During Treatment: Gait belt Activity Tolerance: Patient tolerated treatment well Patient left: in chair;with call bell/phone within reach Nurse Communication: Mobility status     Verdell Face, [place] [phone] 02/12/2012

## 2012-02-13 DIAGNOSIS — R279 Unspecified lack of coordination: Secondary | ICD-10-CM | POA: Diagnosis not present

## 2012-02-13 DIAGNOSIS — F319 Bipolar disorder, unspecified: Secondary | ICD-10-CM | POA: Diagnosis not present

## 2012-02-13 DIAGNOSIS — E871 Hypo-osmolality and hyponatremia: Secondary | ICD-10-CM | POA: Diagnosis present

## 2012-02-13 DIAGNOSIS — I509 Heart failure, unspecified: Secondary | ICD-10-CM

## 2012-02-13 DIAGNOSIS — I5032 Chronic diastolic (congestive) heart failure: Secondary | ICD-10-CM | POA: Diagnosis not present

## 2012-02-13 LAB — BASIC METABOLIC PANEL
Calcium: 11.9 mg/dL — ABNORMAL HIGH (ref 8.4–10.5)
GFR calc non Af Amer: 41 mL/min — ABNORMAL LOW (ref >90)
Sodium: 152 mEq/L — ABNORMAL HIGH (ref 135–145)

## 2012-02-13 MED ORDER — ASPIRIN 325 MG PO TABS: 325.0000 mg | ORAL_TABLET | Freq: Every day | ORAL | Status: DC

## 2012-02-13 MED ORDER — LEVOTHYROXINE SODIUM 100 MCG PO TABS: 100.0000 ug | ORAL_TABLET | Freq: Every day | ORAL | Status: DC

## 2012-02-13 NOTE — Discharge Summary (Signed)
Physician Discharge Summary  [name] ZOX:[id number] DOB: 15-Jul-1945 DOA: 02/10/2012  PCP: Alva Garnet., MD  Admit date: 02/10/2012 Discharge date: 02/13/2012  Time spent: 25 minutes  Recommendations for Outpatient Follow-up:  1. Patient is getting referral to outpatient PT 2. She'll see her primary care physician in the next month  Discharge Diagnoses:  Principal Problem:  *Ataxia Active Problems:  Hypotension, postural  CVA (cerebral infarction)  Bipolar 1 disorder  Hypothyroidism  Hypercalcemia   Discharge Condition: Improved, being discharged home  Diet recommendation: Heart healthy  Filed Weights   02/10/12 2112  Weight: 56.972 kg (125 lb 9.6 oz)    History of present illness:  [name] is a 67 y.o. female with a history of repeated falls over the past few weeks to month. She states that when she is up she will walk a few steps, then get lightheaded and her legs will go out from under her. This has caused her to fall and injure herself multiple times. She also has not been eating or drinking well. She fell on 12/24 with a possible LOC, though this is unclear and also gave herself a laceration over her left eye last night. She has had some nausea and vomiting after her head injury on 12/24. She had an MRI which shows a subacute diffusion change in her right frontal area which could represent traumatic injury or subacute infarction. She had a CTA head and CT on 12/18 which was before her serious falls and this lesion is present at that time as well. Patient was admitted to the hospitalist service for further evaluation.   Hospital Course:  1. Gait disorder/Ataxia with several falls and head injuries: Mild concussion suspected. Per Dr. Pearlean Brownie (discuss personally), stroke is doubted, lesions are thought to be representative of injuries from fall.. Complete Stroke workup. EEG per neurology to rule out seizure. Aspirin for secondary stroke prevention per  neurology. Will need outpatient neurology followup for further evaluation. Followup with Dr. Pearlean Brownie in 2 months. 2. Dizziness: Suspect secondary to orthostasis. Clinically resolved. 3. Hypercalcemia: Patient reports that she has been told about this in the past. This would be presumably from long-term with lithium use. See discussion about TSH below. Not on a thiazide diuretic. Patient appears to be asymptomatic and further evaluation can be conducted in the outpatient setting to exclude other causes. Check intact PTH, results still pending. 4. Hypernatremia, hyperchloremia: Surprising as both values were previously normal 48 hours ago. May be artifact or secondary to normal saline given over that time. Recheck basic metabolic panel. Encourage water intake and recheck laboratory studies in the morning if they are improved potentially discharge home. May be some mild acute dehydration brought on by hypercalcemia. Sodium has since trended down and 24 hours from 158 down to 152 with chloride decreasing from 120 to 118 5. Suspected chronic kidney disease stage III: Appears stable. 6. Bipolar disorder, manic with psychotic features: Off lithium at [facility] secondary to falls. Not lithium level was actually low. Per psychiatry continue Latuda and trazodone 50 mg for sleep 7. Hypothyroidism: Continue Synthroid. TSH level low, strange considering recent level was high December 17. I cannot tell from documentation whether she has had a dosage change or not. Decreased Synthroid dose from 125 mcg to 100 mcg and, recheck in 4 weeks. 8. Recent head laceration prior to admission 12/28: Stitches out January 2. Vaseline to moisturize and remove blood. 9.   Procedures:  Echocardiogram done 12/30: Normal ejection fraction. No signs of  valvular abnormality. Grade 1 diastolic dysfunction  Carotid Dopplers done 12/29: Unremarkable  Consultations:  Mickeal Skinner, psychiatry  Ritta Slot, neurology  Discharge  Exam: Filed Vitals:   02/12/12 2200 02/13/12 0200 02/13/12 0600 02/13/12 0923  BP: 118/44 92/40 142/70 109/44  Pulse: 66 64 50 64  Temp: 97.8 F (36.6 C) 98.3 F (36.8 C) 97.8 F (36.6 C) 98 F (36.7 C)  TempSrc: Oral Oral Oral Oral  Resp: 18 18 18 17   Height:      Weight:      SpO2: 98% 100% 99% 96%    General: Alert and oriented x3, no acute distress Cardiovascular: Regular rate and rhythm, S1-S2 Respiratory: Clear to auscultation bilaterally Abdomen: Soft, nontender, nondistended, positive bowel sounds  extremities: No clubbing or cyanosis or edema  Discharge Instructions  Discharge Orders    Future Orders Please Complete By Expires   Ambulatory referral to Speech Therapy      Comments:   Eval and treat. Traumatic brain injury.   Ambulatory referral to Physical Therapy      Comments:   Eval and treat. Dx: Gait instability.   Questions: Responses:   Iontophoresis - 4 mg/ml of dexamethasone    T.E.N.S. Unit Evaluation and Dispense as Indicated    Diet - low sodium heart healthy      Increase activity slowly          Medication List     As of 02/13/2012  2:31 PM    TAKE these medications         alum & mag hydroxide-simeth 200-200-20 MG/5ML suspension   Commonly known as: MAALOX/MYLANTA   Take 30 mLs by mouth every 6 (six) hours as needed. Acid reflux      aspirin 325 MG tablet   Take 1 tablet (325 mg total) by mouth daily.      estrogens (conjugated) 0.625 MG tablet   Commonly known as: PREMARIN   Take 0.625 mg by mouth daily.      levothyroxine 100 MCG tablet   Commonly known as: SYNTHROID, LEVOTHROID   Take 1 tablet (100 mcg total) by mouth daily before breakfast.      lithium carbonate 450 MG CR tablet   Commonly known as: ESKALITH   Take 450 mg by mouth 2 (two) times daily.      lurasidone 80 MG Tabs   Commonly known as: LATUDA   Take 80 mg by mouth daily with supper.      magnesium hydroxide 400 MG/5ML suspension   Commonly known as: MILK OF  MAGNESIA   Take 30 mLs by mouth daily as needed. For constipation      ondansetron 4 MG disintegrating tablet   Commonly known as: ZOFRAN-ODT   Take 4 mg by mouth every 8 (eight) hours as needed. nausea      oxcarbazepine 600 MG tablet   Commonly known as: TRILEPTAL   Take 600 mg by mouth at bedtime.      potassium chloride SA 20 MEQ tablet   Commonly known as: K-DUR,KLOR-CON   Take 20 mEq by mouth daily.      traZODone 50 MG tablet   Commonly known as: DESYREL   Take 50 mg by mouth at bedtime.           Follow-up Information    Follow up with Alva Garnet., MD. In 2 weeks.   Contact information:   [address] [place] [address] [phone]  The results of significant diagnostics from this hospitalization (including imaging, microbiology, ancillary and laboratory) are listed below for reference.    Significant Diagnostic Studies: Ct Angio Head W/cm &/or Wo Cm  01/30/2012    IMPRESSION: 1.  Negative intracranial CTA. 2. No acute intracranial abnormality.  Nonspecific mild for age subcortical white matter changes.   Original Report Authenticated By: Erskine Speed, M.D.    Dg Chest 2 View  02/10/2012  \  IMPRESSION:  1.  Bibasilar scar versus plate-like atelectasis.   Original Report Authenticated By: Signa Kell, M.D.    Ct Head Wo Contrast  02/09/2012  IMPRESSION:  1.  No evidence of traumatic intracranial injury or fracture. 2.  Soft tissue laceration just superior to the left orbit. 3.  Scattered small vessel ischemic microangiopathy.    Ct Cervical Spine Wo Contrast  02/09/2012  .  IMPRESSION:  1.  No evidence of fracture or subluxation along the cervical spine. 2.  Minimal degenerative change at the mid cervical spine.   Original Report Authenticated By: Tonia Ghent, M.D.    Mr Brain Wo Contrast  02/10/2012  \ IMPRESSION: Cluster of small abnormal foci in the right frontal white matter that I favor represent  shear injuries  related to the fall and head trauma.  See above discussion.  The possibility exists that these could be acute subcortical ischemic infarctions, but that is not favored.   Original Report Authenticated By: Paulina Fusi, M.D.      Labs: Basic Metabolic Panel:  Lab 02/13/12 4540 02/12/12 1632 02/12/12 0615 02/10/12 0243 02/07/12 0627  NA 152* 156* 158* 144 148*  K 4.0 4.0 3.7 3.3* 3.6  CL 118* 122* 127* 110 113*  CO2 24 24 21 26 25   GLUCOSE 195* 110* 130* 120* 122*  BUN 16 14 13 15 11   CREATININE 1.35* 1.32* 1.23* 1.23* 1.22*  CALCIUM 11.9* 11.8* 11.4* 10.7* 11.6*  MG -- -- -- -- --  PHOS -- -- -- -- --   Liver Function Tests:  Lab 02/12/12 0615  AST 75*  ALT 49*  ALKPHOS 80  BILITOT 0.4  PROT 7.8  ALBUMIN 3.7   CBC:  Lab 02/10/12 0243  WBC 7.2  NEUTROABS 5.7  HGB 11.9*  HCT 38.0  MCV 99.0  PLT 218     Signed:  Abbeygail Igoe K  [facility] 02/13/2012, 2:31 PM

## 2012-02-14 LAB — PARATHYROID HORMONE, INTACT (NO CA): PTH: 252.7 pg/mL — ABNORMAL HIGH (ref 14.0–72.0)

## 2012-02-14 LAB — 10-HYDROXYCARBAZEPINE: Triliptal/MTB(Oxcarbazepin): 33.4 ug/mL (ref 8.0–35.0)

## 2012-02-14 NOTE — Care Management Note (Signed)
    Page 1 of 1   02/14/2012     8:50:55 AM   CARE MANAGEMENT NOTE 02/14/2012  Patient:  [name], [name]   Account Number:  [id number]  Date Initiated:  02/14/2012  Documentation initiated by:  [facility]  Subjective/Objective Assessment:   Admitted with AMS, tremors     Action/Plan:   PT/OT/ST evals-recommending outpatient PT and ST   Anticipated DC Date:     Anticipated DC Plan:    In-house referral  Clinical Social Worker      DC Associate Professor  CM consult  OP Neuro Rehab      Choice offered to / List presented to:             Status of service:  Completed, signed off Medicare Important Message given?   (If response is "NO", the following Medicare IM given date fields will be blank) Date Medicare IM given:   Date Additional Medicare IM given:    Discharge Disposition:  HOME/SELF CARE  Per UR Regulation:  Reviewed for med. necessity/level of care/duration of stay  If discussed at Long Length of Stay Meetings, dates discussed:    Comments:  02/14/12 Spoke with patient and her husband on 02/12/12 about outpatient rehab for PT and ST. They were areeable to goiing to Neurorehab. Gave them map and phone number and explained that the rehab would call to make an appt. Today faxed orders, referral, d/c summary, PT eval and ST eval to [phone]. received fax confirmation. Jacquelynn Cree RN, BSn, CCM

## 2012-02-15 DIAGNOSIS — S0190XA Unspecified open wound of unspecified part of head, initial encounter: Secondary | ICD-10-CM | POA: Diagnosis not present

## 2012-02-15 DIAGNOSIS — S0990XA Unspecified injury of head, initial encounter: Secondary | ICD-10-CM | POA: Diagnosis not present

## 2012-02-22 ENCOUNTER — Ambulatory Visit: Payer: Medicare Other | Admitting: Speech Pathology

## 2012-02-22 ENCOUNTER — Ambulatory Visit: Payer: Medicare Other | Attending: Family Medicine | Admitting: Rehabilitative and Restorative Service Providers"

## 2012-02-22 DIAGNOSIS — I69993 Ataxia following unspecified cerebrovascular disease: Secondary | ICD-10-CM | POA: Diagnosis not present

## 2012-02-22 DIAGNOSIS — Z5189 Encounter for other specified aftercare: Secondary | ICD-10-CM | POA: Diagnosis not present

## 2012-02-22 DIAGNOSIS — I69919 Unspecified symptoms and signs involving cognitive functions following unspecified cerebrovascular disease: Secondary | ICD-10-CM | POA: Insufficient documentation

## 2012-02-27 DIAGNOSIS — E039 Hypothyroidism, unspecified: Secondary | ICD-10-CM | POA: Diagnosis not present

## 2012-02-27 DIAGNOSIS — E559 Vitamin D deficiency, unspecified: Secondary | ICD-10-CM | POA: Diagnosis not present

## 2012-02-27 DIAGNOSIS — E210 Primary hyperparathyroidism: Secondary | ICD-10-CM | POA: Diagnosis not present

## 2012-03-03 ENCOUNTER — Other Ambulatory Visit (HOSPITAL_COMMUNITY): Payer: Self-pay | Admitting: Endocrinology

## 2012-03-03 DIAGNOSIS — E559 Vitamin D deficiency, unspecified: Secondary | ICD-10-CM | POA: Diagnosis not present

## 2012-03-03 DIAGNOSIS — E039 Hypothyroidism, unspecified: Secondary | ICD-10-CM

## 2012-03-03 DIAGNOSIS — D351 Benign neoplasm of parathyroid gland: Secondary | ICD-10-CM

## 2012-03-11 DIAGNOSIS — E213 Hyperparathyroidism, unspecified: Secondary | ICD-10-CM | POA: Diagnosis not present

## 2012-03-12 ENCOUNTER — Encounter (INDEPENDENT_AMBULATORY_CARE_PROVIDER_SITE_OTHER): Payer: Self-pay | Admitting: Surgery

## 2012-03-12 ENCOUNTER — Ambulatory Visit (INDEPENDENT_AMBULATORY_CARE_PROVIDER_SITE_OTHER): Payer: Medicare Other | Admitting: Surgery

## 2012-03-13 DIAGNOSIS — F312 Bipolar disorder, current episode manic severe with psychotic features: Secondary | ICD-10-CM | POA: Diagnosis not present

## 2012-03-14 ENCOUNTER — Encounter (HOSPITAL_COMMUNITY)
Admission: RE | Admit: 2012-03-14 | Discharge: 2012-03-14 | Disposition: A | Discharge status: Home / Self Care | Payer: Medicare Other | Source: Ambulatory Visit | Attending: Endocrinology | Admitting: Endocrinology

## 2012-03-14 DIAGNOSIS — E039 Hypothyroidism, unspecified: Secondary | ICD-10-CM | POA: Diagnosis not present

## 2012-03-14 DIAGNOSIS — E213 Hyperparathyroidism, unspecified: Secondary | ICD-10-CM | POA: Insufficient documentation

## 2012-03-14 DIAGNOSIS — D351 Benign neoplasm of parathyroid gland: Secondary | ICD-10-CM

## 2012-03-14 MED ORDER — TECHNETIUM TC 99M SESTAMIBI - CARDIOLITE
25.0000 | Freq: Once | INTRAVENOUS | Status: AC | PRN
  Administered 2012-03-14: 09:00:00 25 via INTRAVENOUS

## 2012-03-18 ENCOUNTER — Ambulatory Visit (INDEPENDENT_AMBULATORY_CARE_PROVIDER_SITE_OTHER): Payer: Medicare Other | Admitting: Surgery

## 2012-03-18 ENCOUNTER — Encounter (INDEPENDENT_AMBULATORY_CARE_PROVIDER_SITE_OTHER): Payer: Self-pay | Admitting: Surgery

## 2012-03-18 DIAGNOSIS — R109 Unspecified abdominal pain: Secondary | ICD-10-CM | POA: Insufficient documentation

## 2012-03-18 NOTE — Progress Notes (Signed)
General Surgery [facility]  Chief Complaint  Patient presents with  . New Evaluation    Hyperparathyroidism - referral from Dr. Sabino Gasser and Dr. Andi Devon    HISTORY: Patient is a 67 year old white female referred by her primary care physician and her endocrinologist for suspected primary hyperparathyroidism. Patient was noted to have hypercalcemia with serum calcium level measuring 11.9 and an elevated intact PTH level ranging from 137-253. Patient underwent CT scan for unrelated reasons in December 2013. A 1.7 cm soft tissue density was noted in the left paratracheal region. This corresponded to uptake noted on a nuclear medicine parathyroid scan performed on 03/14/2012. Findings were suspicious for parathyroid adenoma.  The patient has noted fatigue for approximately one year. She has also experienced poor appetite and has noted approximately a 25 pound weight loss over the past 8 months. She denies any history of nephrolithiasis. By report she has had a bone density scan showing osteoporosis. Past medical history is notable for Graves' disease for which she was treated with radioactive iodine many years ago. There is no family history of endocrinopathy.  Past Medical History  Diagnosis Date  . Thyroid disease   . Hypercalcemia   . Osteoporosis   . Depression   . Hyperlipidemia   . GERD (gastroesophageal reflux disease)   . Stroke   . Chills   . Abdominal pain   . Constipation   . Weakness   . Change in voice   . Weight loss, unintentional   . Hyperparathyroidism      Current Outpatient Prescriptions  Medication Sig Dispense Refill  . acetaminophen (TYLENOL) 500 MG tablet Take 500 mg by mouth as needed.      Marland Kitchen aspirin 325 MG tablet Take 1 tablet (325 mg total) by mouth daily.      Marland Kitchen estrogens, conjugated, (PREMARIN) 0.625 MG tablet Take 0.625 mg by mouth daily.       Marland Kitchen levothyroxine (SYNTHROID, LEVOTHROID) 100 MCG tablet Take 1 tablet (100 mcg  total) by mouth daily before breakfast.  30 tablet  0  . lithium carbonate (ESKALITH) 450 MG CR tablet Take 450 mg by mouth 2 (two) times daily.      Marland Kitchen alum & mag hydroxide-simeth (MAALOX/MYLANTA) 200-200-20 MG/5ML suspension Take 30 mLs by mouth every 6 (six) hours as needed. Acid reflux      . lurasidone (LATUDA) 80 MG TABS Take 80 mg by mouth daily with supper.      . magnesium hydroxide (MILK OF MAGNESIA) 400 MG/5ML suspension Take 30 mLs by mouth daily as needed. For constipation      . ondansetron (ZOFRAN-ODT) 4 MG disintegrating tablet Take 4 mg by mouth every 8 (eight) hours as needed. nausea      . oxcarbazepine (TRILEPTAL) 600 MG tablet Take 600 mg by mouth at bedtime.      . potassium chloride SA (K-DUR,KLOR-CON) 20 MEQ tablet Take 20 mEq by mouth daily.      . traZODone (DESYREL) 50 MG tablet Take 50 mg by mouth at bedtime.         Allergies  Allergen Reactions  . Aspirin   . Caffeine   . Sulfa Antibiotics     Rash on lips  . Trimethoprim      Family History  Problem Relation Age of Onset  . Cancer Mother   . Cancer Maternal Aunt     breast     History   Social History  . Marital Status: Married  Spouse Name: N/A    Number of Children: N/A  . Years of Education: N/A   Social History Main Topics  . Smoking status: Former Smoker -- 1.0 packs/day for 30 years    Types: Cigarettes    Quit date: 03/18/1997  . Smokeless tobacco: Never Used  . Alcohol Use: No  . Drug Use: No  . Sexually Active: None   Other Topics Concern  . None   Social History Narrative  . None     REVIEW OF SYSTEMS - PERTINENT POSITIVES ONLY: Chronic fatigue. Weight loss. Abdominal complaints. Mild tremor. No palpitations. Osteoporosis.  EXAM: Filed Vitals:   03/18/12 1029  BP: 102/68  Pulse: 72  Temp: 97.6 F (36.4 C)  Resp: 16    HEENT: normocephalic; pupils equal and reactive; sclerae clear; dentition good; mucous membranes moist; healing wound above left  eyebrow NECK:  No palpable nodules or masses bilaterally; symmetric on extension; no palpable anterior or posterior cervical lymphadenopathy; no supraclavicular masses; no tenderness CHEST: clear to auscultation bilaterally without rales, rhonchi, or wheezes CARDIAC: regular rate and rhythm without significant murmur; peripheral pulses are full EXT:  non-tender without edema; no deformity NEURO: no gross focal deficits; no sign of tremor   LABORATORY RESULTS: See [facility] (CHL-Epic) for most recent results   RADIOLOGY RESULTS: See [facility] (CHL-Epic) for most recent results   IMPRESSION: #1 primary hyperparathyroidism, likely left inferior parathyroid adenoma #2 personal history of Graves' disease, treatment with radioactive iodine, hypothyroidism #3 bipolar disorder on lithium  PLAN: I had a lengthy discussion with the patient and her husband regarding all of the above issues. I provided him with written literature to review. We discussed minimally invasive parathyroidectomy. We discussed the risk and benefits of the procedure including the potential for recurrent laryngeal nerve injury. We discussed the possibility of a second gland adenoma and need for additional surgery. We discussed potential issues related to her lithium therapy and calcium metabolism. They understand and wish to proceed. We will make arrangements for minimally invasive parathyroidectomy as an outpatient procedure in the near future at a time convenient for the patient.  The risks and benefits of the procedure have been discussed at length with the patient.  The patient understands the proposed procedure, potential alternative treatments, and the course of recovery to be expected.  All of the patient's questions have been answered at this time.  The patient wishes to proceed with surgery.  Velora Heckler, MD, FACS General & Endocrine Surgery [facility]   Visit Diagnoses: 1.  Hypercalcemia     Primary Care Physician: Alva Garnet., MD

## 2012-03-18 NOTE — Patient Instructions (Signed)

## 2012-03-19 DIAGNOSIS — F312 Bipolar disorder, current episode manic severe with psychotic features: Secondary | ICD-10-CM | POA: Diagnosis not present

## 2012-03-20 ENCOUNTER — Encounter (HOSPITAL_COMMUNITY): Payer: Self-pay | Admitting: Pharmacy Technician

## 2012-03-24 ENCOUNTER — Encounter (HOSPITAL_COMMUNITY)
Admission: RE | Admit: 2012-03-24 | Discharge: 2012-03-24 | Disposition: A | Discharge status: Home / Self Care | Payer: Medicare Other | Source: Ambulatory Visit | Attending: Surgery | Admitting: Surgery

## 2012-03-24 ENCOUNTER — Encounter (HOSPITAL_COMMUNITY): Payer: Self-pay

## 2012-03-24 DIAGNOSIS — F319 Bipolar disorder, unspecified: Secondary | ICD-10-CM | POA: Diagnosis not present

## 2012-03-24 DIAGNOSIS — E785 Hyperlipidemia, unspecified: Secondary | ICD-10-CM | POA: Diagnosis not present

## 2012-03-24 DIAGNOSIS — Z8673 Personal history of transient ischemic attack (TIA), and cerebral infarction without residual deficits: Secondary | ICD-10-CM | POA: Diagnosis not present

## 2012-03-24 DIAGNOSIS — E210 Primary hyperparathyroidism: Secondary | ICD-10-CM | POA: Diagnosis not present

## 2012-03-24 DIAGNOSIS — K219 Gastro-esophageal reflux disease without esophagitis: Secondary | ICD-10-CM | POA: Diagnosis not present

## 2012-03-24 DIAGNOSIS — E890 Postprocedural hypothyroidism: Secondary | ICD-10-CM | POA: Diagnosis not present

## 2012-03-24 DIAGNOSIS — Z01812 Encounter for preprocedural laboratory examination: Secondary | ICD-10-CM | POA: Diagnosis not present

## 2012-03-24 DIAGNOSIS — Z79899 Other long term (current) drug therapy: Secondary | ICD-10-CM | POA: Diagnosis not present

## 2012-03-24 LAB — BASIC METABOLIC PANEL
CO2: 25 mEq/L (ref 19–32)
Calcium: 12.6 mg/dL — ABNORMAL HIGH (ref 8.4–10.5)
Chloride: 113 mEq/L — ABNORMAL HIGH (ref 96–112)
Glucose, Bld: 107 mg/dL — ABNORMAL HIGH (ref 70–99)
Potassium: 4.3 mEq/L (ref 3.5–5.1)
Sodium: 146 mEq/L — ABNORMAL HIGH (ref 135–145)

## 2012-03-24 LAB — CBC
Hemoglobin: 13.7 g/dL (ref 12.0–15.0)
Platelets: 320 10*3/uL (ref 150–400)
RBC: 4.5 MIL/uL (ref 3.87–5.11)
WBC: 10.6 10*3/uL — ABNORMAL HIGH (ref 4.0–10.5)

## 2012-03-24 LAB — SURGICAL PCR SCREEN
MRSA, PCR: NEGATIVE
Staphylococcus aureus: POSITIVE — AB

## 2012-03-24 NOTE — Pre-Procedure Instructions (Signed)
PREOP CBC, BMET WERE DONE TODAY AT [facility] AS PER ANESTHESIOLOGIST'S GUIDELINES.   EKG REPORT IN EPIC FROM 01/30/12 AND CXR REPORT IN EPIC FROM 02/10/12.

## 2012-03-24 NOTE — Patient Instructions (Signed)
YOUR SURGERY IS SCHEDULED AT [facility]  ON:   Thursday  2/13  REPORT TO [facility] AT: 8:30 AM      PHONE # FOR SHORT STAY IS [phone]  DO NOT EAT OR DRINK ANYTHING AFTER MIDNIGHT THE NIGHT BEFORE YOUR SURGERY.  YOU MAY BRUSH YOUR TEETH, RINSE OUT YOUR MOUTH--BUT NO WATER, NO FOOD, NO CHEWING GUM, NO MINTS, NO CANDIES, NO CHEWING TOBACCO.  PLEASE TAKE THE FOLLOWING MEDICATIONS THE AM OF YOUR SURGERY WITH A FEW SIPS OF WATER:  LEVOTHYROXINE AND LITHIUM    DO NOT BRING VALUABLES, MONEY, CREDIT CARDS.  DO NOT WEAR JEWELRY, MAKE-UP, NAIL POLISH AND NO METAL PINS OR CLIPS IN YOUR HAIR. CONTACT LENS, DENTURES / PARTIALS, GLASSES SHOULD NOT BE WORN TO SURGERY AND IN MOST CASES-HEARING AIDS WILL NEED TO BE REMOVED.  BRING YOUR GLASSES CASE, ANY EQUIPMENT NEEDED FOR YOUR CONTACT LENS. FOR PATIENTS ADMITTED TO THE HOSPITAL--CHECK OUT TIME THE DAY OF DISCHARGE IS 11:00 AM.  ALL INPATIENT ROOMS ARE PRIVATE - WITH BATHROOM, TELEPHONE, TELEVISION AND WIFI INTERNET.  IF YOU ARE BEING DISCHARGED THE SAME DAY OF YOUR SURGERY--YOU CAN NOT DRIVE YOURSELF HOME--AND SHOULD NOT GO HOME ALONE BY TAXI OR BUS.  NO DRIVING OR OPERATING MACHINERY FOR 24 HOURS FOLLOWING ANESTHESIA / PAIN MEDICATIONS.  PLEASE MAKE ARRANGEMENTS FOR SOMEONE TO BE WITH YOU AT HOME THE FIRST 24 HOURS AFTER SURGERY. RESPONSIBLE DRIVER'S NAME Lawernce Ion                                               PHONE #   [phone]                              PLEASE READ OVER ANY  FACT SHEETS THAT YOU WERE GIVEN: MRSA INFORMATION, BLOOD TRANSFUSION INFORMATION, INCENTIVE SPIROMETER INFORMATION. FAILURE TO FOLLOW THESE INSTRUCTIONS MAY RESULT IN THE CANCELLATION OF YOUR SURGERY.   PATIENT SIGNATURE_________________________________

## 2012-03-24 NOTE — Progress Notes (Signed)
Quick Note:  These results are acceptable for scheduled surgery.  Letetia Romanello M. Jaydon Avina, MD, FACS [facility] Office: [phone]   ______ 

## 2012-03-25 DIAGNOSIS — E785 Hyperlipidemia, unspecified: Secondary | ICD-10-CM | POA: Diagnosis not present

## 2012-03-25 DIAGNOSIS — E870 Hyperosmolality and hypernatremia: Secondary | ICD-10-CM | POA: Diagnosis not present

## 2012-03-25 DIAGNOSIS — N184 Chronic kidney disease, stage 4 (severe): Secondary | ICD-10-CM | POA: Diagnosis not present

## 2012-03-25 DIAGNOSIS — R7301 Impaired fasting glucose: Secondary | ICD-10-CM | POA: Diagnosis not present

## 2012-03-27 ENCOUNTER — Ambulatory Visit (HOSPITAL_COMMUNITY)
Admission: RE | Admit: 2012-03-27 | Discharge: 2012-03-27 | Disposition: A | Discharge status: Home / Self Care | Payer: Medicare Other | Source: Ambulatory Visit | Attending: Surgery | Admitting: Surgery

## 2012-03-27 ENCOUNTER — Encounter (HOSPITAL_COMMUNITY): Payer: Self-pay | Admitting: Anesthesiology

## 2012-03-27 ENCOUNTER — Encounter (HOSPITAL_COMMUNITY)
Admission: RE | Disposition: A | Discharge status: Home / Self Care | Payer: Self-pay | Source: Ambulatory Visit | Attending: Surgery

## 2012-03-27 ENCOUNTER — Encounter (HOSPITAL_COMMUNITY): Payer: Self-pay | Admitting: *Deleted

## 2012-03-27 ENCOUNTER — Ambulatory Visit (HOSPITAL_COMMUNITY): Payer: Medicare Other | Admitting: Anesthesiology

## 2012-03-27 DIAGNOSIS — E890 Postprocedural hypothyroidism: Secondary | ICD-10-CM | POA: Insufficient documentation

## 2012-03-27 DIAGNOSIS — E210 Primary hyperparathyroidism: Secondary | ICD-10-CM

## 2012-03-27 DIAGNOSIS — K219 Gastro-esophageal reflux disease without esophagitis: Secondary | ICD-10-CM | POA: Insufficient documentation

## 2012-03-27 DIAGNOSIS — Z01812 Encounter for preprocedural laboratory examination: Secondary | ICD-10-CM | POA: Insufficient documentation

## 2012-03-27 DIAGNOSIS — Z8673 Personal history of transient ischemic attack (TIA), and cerebral infarction without residual deficits: Secondary | ICD-10-CM | POA: Insufficient documentation

## 2012-03-27 DIAGNOSIS — F319 Bipolar disorder, unspecified: Secondary | ICD-10-CM | POA: Insufficient documentation

## 2012-03-27 DIAGNOSIS — D351 Benign neoplasm of parathyroid gland: Secondary | ICD-10-CM | POA: Diagnosis not present

## 2012-03-27 DIAGNOSIS — E213 Hyperparathyroidism, unspecified: Secondary | ICD-10-CM | POA: Diagnosis present

## 2012-03-27 DIAGNOSIS — E785 Hyperlipidemia, unspecified: Secondary | ICD-10-CM | POA: Insufficient documentation

## 2012-03-27 DIAGNOSIS — Z79899 Other long term (current) drug therapy: Secondary | ICD-10-CM | POA: Insufficient documentation

## 2012-03-27 HISTORY — PX: PARATHYROIDECTOMY: SHX19

## 2012-03-27 SURGERY — PARATHYROIDECTOMY
Anesthesia: General | Laterality: Left | Wound class: Clean

## 2012-03-27 MED ORDER — ONDANSETRON HCL 4 MG/2ML IJ SOLN
INTRAMUSCULAR | Status: DC | PRN
  Administered 2012-03-27 (×2): 2 mg via INTRAVENOUS

## 2012-03-27 MED ORDER — GLYCOPYRROLATE 0.2 MG/ML IJ SOLN
INTRAMUSCULAR | Status: DC | PRN
  Administered 2012-03-27: 0.6 mg via INTRAVENOUS

## 2012-03-27 MED ORDER — CISATRACURIUM BESYLATE (PF) 10 MG/5ML IV SOLN
INTRAVENOUS | Status: DC | PRN
  Administered 2012-03-27: 6 mg via INTRAVENOUS

## 2012-03-27 MED ORDER — ACETAMINOPHEN 10 MG/ML IV SOLN: 1000.0000 mg | Freq: Once | INTRAVENOUS | Status: DC | PRN

## 2012-03-27 MED ORDER — MEPERIDINE HCL 50 MG/ML IJ SOLN: 6.2500 mg | INTRAMUSCULAR | Status: DC | PRN

## 2012-03-27 MED ORDER — SODIUM CHLORIDE 0.9 % IV SOLN
INTRAVENOUS | Status: DC | PRN
  Administered 2012-03-27 (×2): via INTRAVENOUS

## 2012-03-27 MED ORDER — OXYCODONE HCL 5 MG/5ML PO SOLN
5.0000 mg | Freq: Once | ORAL | Status: DC | PRN
  Filled 2012-03-27: qty 5

## 2012-03-27 MED ORDER — LIDOCAINE HCL (CARDIAC) 20 MG/ML IV SOLN
INTRAVENOUS | Status: DC | PRN
  Administered 2012-03-27: 75 mg via INTRAVENOUS

## 2012-03-27 MED ORDER — PROPOFOL 10 MG/ML IV EMUL
INTRAVENOUS | Status: DC | PRN
  Administered 2012-03-27: 150 mg via INTRAVENOUS

## 2012-03-27 MED ORDER — DEXAMETHASONE SODIUM PHOSPHATE 10 MG/ML IJ SOLN
INTRAMUSCULAR | Status: DC | PRN
  Administered 2012-03-27: 10 mg via INTRAVENOUS

## 2012-03-27 MED ORDER — BUPIVACAINE HCL 0.25 % IJ SOLN
INTRAMUSCULAR | Status: DC | PRN
  Administered 2012-03-27: 10 mL

## 2012-03-27 MED ORDER — HYDROMORPHONE HCL PF 1 MG/ML IJ SOLN: 0.2500 mg | INTRAMUSCULAR | Status: DC | PRN

## 2012-03-27 MED ORDER — HYDROCODONE-ACETAMINOPHEN 5-325 MG PO TABS
1.0000 | ORAL_TABLET | ORAL | Status: DC | PRN
Start: 2012-03-27 — End: 2012-04-14

## 2012-03-27 MED ORDER — SUCCINYLCHOLINE CHLORIDE 20 MG/ML IJ SOLN
INTRAMUSCULAR | Status: DC | PRN
  Administered 2012-03-27: 100 mg via INTRAVENOUS

## 2012-03-27 MED ORDER — FENTANYL CITRATE 0.05 MG/ML IJ SOLN
INTRAMUSCULAR | Status: DC | PRN
  Administered 2012-03-27 (×3): 50 ug via INTRAVENOUS

## 2012-03-27 MED ORDER — BUPIVACAINE HCL 0.25 % IJ SOLN
INTRAMUSCULAR | Status: AC
  Filled 2012-03-27: qty 1

## 2012-03-27 MED ORDER — OXYCODONE HCL 5 MG PO TABS: 5.0000 mg | ORAL_TABLET | Freq: Once | ORAL | Status: DC | PRN

## 2012-03-27 MED ORDER — CEFAZOLIN SODIUM-DEXTROSE 2-3 GM-% IV SOLR
INTRAVENOUS | Status: AC
  Filled 2012-03-27: qty 50

## 2012-03-27 MED ORDER — PROMETHAZINE HCL 25 MG/ML IJ SOLN: 6.2500 mg | INTRAMUSCULAR | Status: DC | PRN

## 2012-03-27 MED ORDER — NEOSTIGMINE METHYLSULFATE 1 MG/ML IJ SOLN
INTRAMUSCULAR | Status: DC | PRN
  Administered 2012-03-27: 5 mg via INTRAVENOUS

## 2012-03-27 MED ORDER — CEFAZOLIN SODIUM-DEXTROSE 2-3 GM-% IV SOLR
2.0000 g | INTRAVENOUS | Status: AC
  Administered 2012-03-27: 2 g via INTRAVENOUS

## 2012-03-27 SURGICAL SUPPLY — 40 items
APL SKNCLS STERI-STRIP NONHPOA (GAUZE/BANDAGES/DRESSINGS) ×1
ATTRACTOMAT 16X20 MAGNETIC DRP (DRAPES) ×2 IMPLANT
BENZOIN TINCTURE PRP APPL 2/3 (GAUZE/BANDAGES/DRESSINGS) ×2 IMPLANT
BLADE HEX COATED 2.75 (ELECTRODE) ×2 IMPLANT
BLADE SURG 15 STRL LF DISP TIS (BLADE) ×1 IMPLANT
BLADE SURG 15 STRL SS (BLADE) ×2
CANISTER SUCTION 2500CC (MISCELLANEOUS) ×2 IMPLANT
CHLORAPREP W/TINT 10.5 ML (MISCELLANEOUS) ×2 IMPLANT
CLIP TI MEDIUM 6 (CLIP) ×3 IMPLANT
CLIP TI WIDE RED SMALL 6 (CLIP) ×3 IMPLANT
CLOTH BEACON ORANGE TIMEOUT ST (SAFETY) ×2 IMPLANT
DISSECTOR ROUND CHERRY 3/8 STR (MISCELLANEOUS) ×1 IMPLANT
DRAPE PED LAPAROTOMY (DRAPES) ×2 IMPLANT
DRESSING SURGICEL FIBRLLR 1X2 (HEMOSTASIS) ×1 IMPLANT
DRSG SURGICEL FIBRILLAR 1X2 (HEMOSTASIS) ×2
ELECT REM PT RETURN 9FT ADLT (ELECTROSURGICAL) ×2
ELECTRODE REM PT RTRN 9FT ADLT (ELECTROSURGICAL) ×1 IMPLANT
GAUZE SPONGE 4X4 16PLY XRAY LF (GAUZE/BANDAGES/DRESSINGS) ×1 IMPLANT
GLOVE SURG ORTHO 8.0 STRL STRW (GLOVE) ×2 IMPLANT
GOWN STRL NON-REIN LRG LVL3 (GOWN DISPOSABLE) ×2 IMPLANT
GOWN STRL REIN XL XLG (GOWN DISPOSABLE) ×4 IMPLANT
KIT BASIN OR (CUSTOM PROCEDURE TRAY) ×2 IMPLANT
NDL HYPO 25X1 1.5 SAFETY (NEEDLE) ×1 IMPLANT
NEEDLE HYPO 25X1 1.5 SAFETY (NEEDLE) ×2 IMPLANT
NS IRRIG 1000ML POUR BTL (IV SOLUTION) ×1 IMPLANT
PACK BASIC VI WITH GOWN DISP (CUSTOM PROCEDURE TRAY) ×2 IMPLANT
PENCIL BUTTON HOLSTER BLD 10FT (ELECTRODE) ×2 IMPLANT
SPONGE GAUZE 4X4 12PLY (GAUZE/BANDAGES/DRESSINGS) IMPLANT
STAPLER VISISTAT 35W (STAPLE) ×1 IMPLANT
STRIP CLOSURE SKIN 1/2X4 (GAUZE/BANDAGES/DRESSINGS) ×2 IMPLANT
SUT MNCRL AB 4-0 PS2 18 (SUTURE) ×2 IMPLANT
SUT SILK 2 0 (SUTURE) ×2
SUT SILK 2-0 18XBRD TIE 12 (SUTURE) ×1 IMPLANT
SUT SILK 3 0 (SUTURE)
SUT SILK 3-0 18XBRD TIE 12 (SUTURE) IMPLANT
SUT VIC AB 3-0 SH 18 (SUTURE) ×1 IMPLANT
SYR BULB IRRIGATION 50ML (SYRINGE) ×2 IMPLANT
SYR CONTROL 10ML LL (SYRINGE) ×2 IMPLANT
TOWEL OR 17X26 10 PK STRL BLUE (TOWEL DISPOSABLE) ×2 IMPLANT
YANKAUER SUCT BULB TIP 10FT TU (MISCELLANEOUS) ×2 IMPLANT

## 2012-03-27 NOTE — Anesthesia Preprocedure Evaluation (Addendum)
Anesthesia Evaluation  Patient identified by MRN, date of birth, ID band Patient awake    Reviewed: Allergy & Precautions, H&P , NPO status , Patient's Chart, lab work & pertinent test results  Airway Mallampati: II TM Distance: >3 FB Neck ROM: Full    Dental  (+) Dental Advisory Given and Teeth Intact   Pulmonary neg pulmonary ROS,  breath sounds clear to auscultation  Pulmonary exam normal       Cardiovascular +CHF Rhythm:Regular Rate:Normal     Neuro/Psych PSYCHIATRIC DISORDERS Depression CVA negative psych ROS   GI/Hepatic Neg liver ROS, GERD-  Medicated,  Endo/Other  Hypothyroidism   Renal/GU negative Renal ROS  negative genitourinary   Musculoskeletal negative musculoskeletal ROS (+)   Abdominal   Peds negative pediatric ROS (+)  Hematology negative hematology ROS (+)   Anesthesia Other Findings   Reproductive/Obstetrics                          Anesthesia Physical Anesthesia Plan  ASA: II  Anesthesia Plan: General   Post-op Pain Management:    Induction: Intravenous  Airway Management Planned: Oral ETT  Additional Equipment:   Intra-op Plan:   Post-operative Plan: Extubation in OR  Informed Consent: I have reviewed the patients History and Physical, chart, labs and discussed the procedure including the risks, benefits and alternatives for the proposed anesthesia with the patient or authorized representative who has indicated his/her understanding and acceptance.   Dental advisory given  Plan Discussed with: CRNA  Anesthesia Plan Comments:         Anesthesia Quick Evaluation

## 2012-03-27 NOTE — Preoperative (Signed)
Beta Blockers   Reason not to administer Beta Blockers:Not Applicable 

## 2012-03-27 NOTE — H&P (View-Only) (Signed)
General Surgery - [facility]  Chief Complaint  Patient presents with  . New Evaluation    Hyperparathyroidism - referral from Dr. Bindu Balan and Dr. Kimberly Shelton    HISTORY: Patient is a 67-year-old white female referred by her primary care physician and her endocrinologist for suspected primary hyperparathyroidism. Patient was noted to have hypercalcemia with serum calcium level measuring 11.9 and an elevated intact PTH level ranging from 137-253. Patient underwent CT scan for unrelated reasons in December 2013. A 1.7 cm soft tissue density was noted in the left paratracheal region. This corresponded to uptake noted on a nuclear medicine parathyroid scan performed on 03/14/2012. Findings were suspicious for parathyroid adenoma.  The patient has noted fatigue for approximately one year. She has also experienced poor appetite and has noted approximately a 25 pound weight loss over the past 8 months. She denies any history of nephrolithiasis. By report she has had a bone density scan showing osteoporosis. Past medical history is notable for Graves' disease for which she was treated with radioactive iodine many years ago. There is no family history of endocrinopathy.  Past Medical History  Diagnosis Date  . Thyroid disease   . Hypercalcemia   . Osteoporosis   . Depression   . Hyperlipidemia   . GERD (gastroesophageal reflux disease)   . Stroke   . Chills   . Abdominal pain   . Constipation   . Weakness   . Change in voice   . Weight loss, unintentional   . Hyperparathyroidism      Current Outpatient Prescriptions  Medication Sig Dispense Refill  . acetaminophen (TYLENOL) 500 MG tablet Take 500 mg by mouth as needed.      . aspirin 325 MG tablet Take 1 tablet (325 mg total) by mouth daily.      . estrogens, conjugated, (PREMARIN) 0.625 MG tablet Take 0.625 mg by mouth daily.       . levothyroxine (SYNTHROID, LEVOTHROID) 100 MCG tablet Take 1 tablet (100 mcg  total) by mouth daily before breakfast.  30 tablet  0  . lithium carbonate (ESKALITH) 450 MG CR tablet Take 450 mg by mouth 2 (two) times daily.      . alum & mag hydroxide-simeth (MAALOX/MYLANTA) 200-200-20 MG/5ML suspension Take 30 mLs by mouth every 6 (six) hours as needed. Acid reflux      . lurasidone (LATUDA) 80 MG TABS Take 80 mg by mouth daily with supper.      . magnesium hydroxide (MILK OF MAGNESIA) 400 MG/5ML suspension Take 30 mLs by mouth daily as needed. For constipation      . ondansetron (ZOFRAN-ODT) 4 MG disintegrating tablet Take 4 mg by mouth every 8 (eight) hours as needed. nausea      . oxcarbazepine (TRILEPTAL) 600 MG tablet Take 600 mg by mouth at bedtime.      . potassium chloride SA (K-DUR,KLOR-CON) 20 MEQ tablet Take 20 mEq by mouth daily.      . traZODone (DESYREL) 50 MG tablet Take 50 mg by mouth at bedtime.         Allergies  Allergen Reactions  . Aspirin   . Caffeine   . Sulfa Antibiotics     Rash on lips  . Trimethoprim      Family History  Problem Relation Age of Onset  . Cancer Mother   . Cancer Maternal Aunt     breast     History   Social History  . Marital Status: Married      Spouse Name: N/A    Number of Children: N/A  . Years of Education: N/A   Social History Main Topics  . Smoking status: Former Smoker -- 1.0 packs/day for 30 years    Types: Cigarettes    Quit date: 03/18/1997  . Smokeless tobacco: Never Used  . Alcohol Use: No  . Drug Use: No  . Sexually Active: None   Other Topics Concern  . None   Social History Narrative  . None     REVIEW OF SYSTEMS - PERTINENT POSITIVES ONLY: Chronic fatigue. Weight loss. Abdominal complaints. Mild tremor. No palpitations. Osteoporosis.  EXAM: Filed Vitals:   03/18/12 1029  BP: 102/68  Pulse: 72  Temp: 97.6 F (36.4 C)  Resp: 16    HEENT: normocephalic; pupils equal and reactive; sclerae clear; dentition good; mucous membranes moist; healing wound above left  eyebrow NECK:  No palpable nodules or masses bilaterally; symmetric on extension; no palpable anterior or posterior cervical lymphadenopathy; no supraclavicular masses; no tenderness CHEST: clear to auscultation bilaterally without rales, rhonchi, or wheezes CARDIAC: regular rate and rhythm without significant murmur; peripheral pulses are full EXT:  non-tender without edema; no deformity NEURO: no gross focal deficits; no sign of tremor   LABORATORY RESULTS: See [facility] (CHL-Epic) for most recent results   RADIOLOGY RESULTS: See [facility] (CHL-Epic) for most recent results   IMPRESSION: #1 primary hyperparathyroidism, likely left inferior parathyroid adenoma #2 personal history of Graves' disease, treatment with radioactive iodine, hypothyroidism #3 bipolar disorder on lithium  PLAN: I had a lengthy discussion with the patient and her husband regarding all of the above issues. I provided him with written literature to review. We discussed minimally invasive parathyroidectomy. We discussed the risk and benefits of the procedure including the potential for recurrent laryngeal nerve injury. We discussed the possibility of a second gland adenoma and need for additional surgery. We discussed potential issues related to her lithium therapy and calcium metabolism. They understand and wish to proceed. We will make arrangements for minimally invasive parathyroidectomy as an outpatient procedure in the near future at a time convenient for the patient.  The risks and benefits of the procedure have been discussed at length with the patient.  The patient understands the proposed procedure, potential alternative treatments, and the course of recovery to be expected.  All of the patient's questions have been answered at this time.  The patient wishes to proceed with surgery.  Gal Smolinski M. Tenesha Garza, MD, FACS General & Endocrine Surgery [facility]   Visit Diagnoses: 1.  Hypercalcemia     Primary Care Physician: SHELTON,KIMBERLY R., MD   

## 2012-03-27 NOTE — Anesthesia Postprocedure Evaluation (Signed)
Anesthesia Post Note  Patient: [name]  Procedure(s) Performed: Procedure(s) (LRB): Left Inferior Mininally Invasive PARATHYROIDECTOMY (Left)  Anesthesia type: General  Patient location: PACU  Post pain: Pain level controlled  Post assessment: Post-op Vital signs reviewed  Last Vitals: BP 143/68  Pulse 82  Temp(Src) 36.8 C  Resp 19  SpO2 99%  Post vital signs: Reviewed  Level of consciousness: sedated  Complications: No apparent anesthesia complications

## 2012-03-27 NOTE — Op Note (Signed)
OPERATIVE REPORT - PARATHYROIDECTOMY  Preoperative diagnosis: Primary hyperparathyroidism  Postop diagnosis: Same  Procedure: Left inferior minimally invasive parathyroidectomy  Surgeon:  Velora Heckler, MD, FACS  Assistant:  Chevis Pretty, MD, FACS  Anesthesia: Gen. endotracheal  Estimated blood loss: Minimal  Preparation: ChloraPrep  Indications: Patient is a 67 yo WF with biochemical evidence of primary hyperparathyroidism.  Nuclear scan localizes to the left inferior position.  This is confirmed by a 1.7 cm mass seen at that location on CT scan of the neck.  Procedure: Patient was prepared in the holding area. He was brought to operating room and placed in a supine position on the operating room table. Following administration of general anesthesia, the patient was positioned and then prepped and draped in the usual strict aseptic fashion. After ascertaining that an adequate level of anesthesia been achieved, a neck incision is made with a #15 blade. Dissection was carried through subcutaneous tissues and platysma. Hemostasis was obtained with the electrocautery. Skin flaps were developed circumferentially and a Weitlander retractor was placed for exposure.  Strap muscles were incised in the midline. Strap muscles were reflected exposing the thyroid lobe. With gentle blunt dissection the thyroid lobe was mobilized.  Dissection was carried through adipose tissue and an enlarged parathyroid gland is identified. It is gently mobilized. Vascular structures are divided between small and medium ligaclips. Care is taken to avoid the recurrent laryngeal nerve and the esophagus. Gland is completely excised. It is submitted to pathology. Frozen section confirms parathyroid tissue consistent with adenoma.  Neck is irrigated with warm saline. Good hemostasis is noted. Fibrillar is placed in the operative field. Strap muscles are reapproximated in the midline with interrupted 3-0 Vicryl sutures. Platysma  was closed with interrupted 3-0 Vicryl sutures. Skin is closed with a running 4-0 Monocryl subcuticular suture. Local anesthetic is infiltrated circumferentially. Wound was washed and dried and benzoin and Steri-Strips are applied. Sterile gauze dressing is applied. Patient is awakened from anesthesia and brought to the recovery room. The patient tolerated the procedure well.   Velora Heckler, MD, FACS General & Endocrine Surgery [facility]

## 2012-03-27 NOTE — Interval H&P Note (Signed)
History and Physical Interval Note:  03/27/2012 10:07 AM  [name]  has presented today for surgery, with the diagnosis of primary hyperparathyroidism.  The various methods of treatment have been discussed with the patient and family. After consideration of risks, benefits and other options for treatment, the patient has consented to    Procedure(s) with comments: Left Inferior Mininally Invasive PARATHYROIDECTOMY (N/A) - Left Inferior Mininally Invasive Parathyroidectomy as a surgical intervention .    The patient's history has been reviewed, patient examined, no change in status, stable for surgery.  I have reviewed the patient's chart and labs.  Questions were answered to the patient's satisfaction.   Velora Heckler, MD, FACS General & Endocrine Surgery [facility] Office: [phone]    Braedyn Kauk Judie Petit

## 2012-03-27 NOTE — Transfer of Care (Signed)
Immediate Anesthesia Transfer of Care Note  Patient: [name]  Procedure(s) Performed: Procedure(s) with comments: Left Inferior Mininally Invasive PARATHYROIDECTOMY (Left) - Left Inferior Mininally Invasive Parathyroidectomy  Patient Location: PACU  Anesthesia Type:General  Level of Consciousness: awake, pateint uncooperative, confused, lethargic and responds to stimulation  Airway & Oxygen Therapy: Patient Spontanous Breathing and Patient connected to face mask oxygen  Post-op Assessment: Report given to PACU RN, Post -op Vital signs reviewed and stable and Patient moving all extremities  Post vital signs: Reviewed and stable  Complications: No apparent anesthesia complications

## 2012-03-28 ENCOUNTER — Encounter (HOSPITAL_COMMUNITY): Payer: Self-pay | Admitting: Surgery

## 2012-03-29 ENCOUNTER — Other Ambulatory Visit: Payer: Self-pay

## 2012-03-31 DIAGNOSIS — F312 Bipolar disorder, current episode manic severe with psychotic features: Secondary | ICD-10-CM | POA: Diagnosis not present

## 2012-04-01 ENCOUNTER — Telehealth (INDEPENDENT_AMBULATORY_CARE_PROVIDER_SITE_OTHER): Payer: Self-pay

## 2012-04-01 ENCOUNTER — Other Ambulatory Visit (INDEPENDENT_AMBULATORY_CARE_PROVIDER_SITE_OTHER): Payer: Self-pay

## 2012-04-01 DIAGNOSIS — E213 Hyperparathyroidism, unspecified: Secondary | ICD-10-CM

## 2012-04-01 NOTE — Telephone Encounter (Signed)
Pt notified of path and that lab slip is in mail for calcium to be drawn at [facility] feb 28.

## 2012-04-02 DIAGNOSIS — F312 Bipolar disorder, current episode manic severe with psychotic features: Secondary | ICD-10-CM | POA: Diagnosis not present

## 2012-04-03 DIAGNOSIS — E039 Hypothyroidism, unspecified: Secondary | ICD-10-CM | POA: Diagnosis not present

## 2012-04-03 DIAGNOSIS — R7301 Impaired fasting glucose: Secondary | ICD-10-CM | POA: Diagnosis not present

## 2012-04-03 DIAGNOSIS — E213 Hyperparathyroidism, unspecified: Secondary | ICD-10-CM | POA: Diagnosis not present

## 2012-04-04 DIAGNOSIS — R7301 Impaired fasting glucose: Secondary | ICD-10-CM | POA: Diagnosis not present

## 2012-04-04 DIAGNOSIS — E039 Hypothyroidism, unspecified: Secondary | ICD-10-CM | POA: Diagnosis not present

## 2012-04-04 DIAGNOSIS — E213 Hyperparathyroidism, unspecified: Secondary | ICD-10-CM | POA: Diagnosis not present

## 2012-04-11 DIAGNOSIS — E213 Hyperparathyroidism, unspecified: Secondary | ICD-10-CM | POA: Diagnosis not present

## 2012-04-14 ENCOUNTER — Ambulatory Visit (INDEPENDENT_AMBULATORY_CARE_PROVIDER_SITE_OTHER): Payer: Medicare Other | Admitting: Surgery

## 2012-04-14 ENCOUNTER — Encounter (INDEPENDENT_AMBULATORY_CARE_PROVIDER_SITE_OTHER): Payer: Self-pay | Admitting: Surgery

## 2012-04-14 VITALS — BP 112/64 | HR 56 | Temp 97.4°F | Resp 14 | Ht 62.0 in | Wt 114.2 lb

## 2012-04-14 DIAGNOSIS — E210 Primary hyperparathyroidism: Secondary | ICD-10-CM

## 2012-04-14 NOTE — Patient Instructions (Signed)
  COCOA BUTTER & VITAMIN E CREAM  ([employer] or other brand)  Apply cocoa butter/vitamin E cream to your incision 2 - 3 times daily.  Massage cream into incision for one minute with each application.  Use sunscreen (50 SPF or higher) for first 6 months after surgery if area is exposed to sun.  You may substitute Mederma or other scar reducing creams as desired.   

## 2012-04-14 NOTE — Progress Notes (Signed)
General Surgery [facility]  Visit Diagnoses: 1. Primary hyperparathyroidism   2. Hypercalcemia     HISTORY: Patient returns for first postoperative visit having undergone minimally invasive parathyroidectomy on 03/27/2012. Followup serum calcium level is now normal at 9.7.  EXAM: Surgical incision is well healed. No sign of seroma. No sign of infection. Voice quality is normal.  IMPRESSION: Status post minimally invasive parathyroidectomy  PLAN: Patient will begin applying topical creams to her incision. She will return in 6 weeks for wound check. We will check an intact PTH level and serum calcium level prior to that office visit.  Velora Heckler, MD, FACS General & Endocrine Surgery [facility]

## 2012-04-22 ENCOUNTER — Encounter (INDEPENDENT_AMBULATORY_CARE_PROVIDER_SITE_OTHER): Payer: Self-pay

## 2012-05-28 DIAGNOSIS — F312 Bipolar disorder, current episode manic severe with psychotic features: Secondary | ICD-10-CM | POA: Diagnosis not present

## 2012-05-30 DIAGNOSIS — E213 Hyperparathyroidism, unspecified: Secondary | ICD-10-CM | POA: Diagnosis not present

## 2012-06-02 ENCOUNTER — Encounter (INDEPENDENT_AMBULATORY_CARE_PROVIDER_SITE_OTHER): Payer: Self-pay | Admitting: Surgery

## 2012-06-02 ENCOUNTER — Ambulatory Visit (INDEPENDENT_AMBULATORY_CARE_PROVIDER_SITE_OTHER): Payer: Medicare Other | Admitting: Surgery

## 2012-06-02 VITALS — BP 114/64 | HR 71 | Temp 97.0°F | Ht 62.0 in | Wt 121.4 lb

## 2012-06-02 DIAGNOSIS — E210 Primary hyperparathyroidism: Secondary | ICD-10-CM

## 2012-06-02 NOTE — Patient Instructions (Signed)
  COCOA BUTTER & VITAMIN E CREAM  ([employer] or other brand)  Apply cocoa butter/vitamin E cream to your incision 2 - 3 times daily.  Massage cream into incision for one minute with each application.  Use sunscreen (50 SPF or higher) for first 6 months after surgery if area is exposed to sun.  You may substitute Mederma or other scar reducing creams as desired.   

## 2012-06-02 NOTE — Progress Notes (Signed)
General Surgery [facility]  Visit Diagnoses: 1. Primary hyperparathyroidism   2. Hypercalcemia     HISTORY: Patient returns for followup having undergone minimally invasive parathyroidectomy for an enlarged left sided parathyroid adenoma. Final pathology showed a gland measuring 1.5 cm and weighing 1000 mg. Postoperatively her laboratory levels have improved. Serum calcium level fell from a preoperative level of 11.9 to a postoperative level of 9.7. Her most recent calcium level is now 10.7. Intact PTH level fell from a preoperative level of 253 to her current level of 79. Both the PTH level and serum calcium level remain slightly above the normal range. This may resolve with time. It may also be related to her recent lithium therapy.  EXAM: Surgical wound is healed nicely with a good cosmetic result. No sign of seroma. No palpable mass. Voice quality is normal.  IMPRESSION: Status post minimally invasive parathyroidectomy  PLAN: Patient will have laboratory work with her primary care physician in August. I would like for him to check an intact PTH level and serum calcium level at that time and forward the results to my office.  Patient will apply topical creams to her incision including sunscreen this summer.  Patient will return for surgical care as needed.  Velora Heckler, MD, FACS General & Endocrine Surgery [facility]

## 2012-06-04 ENCOUNTER — Other Ambulatory Visit: Payer: Self-pay | Admitting: Dermatology

## 2012-06-04 DIAGNOSIS — L219 Seborrheic dermatitis, unspecified: Secondary | ICD-10-CM | POA: Diagnosis not present

## 2012-06-04 DIAGNOSIS — L259 Unspecified contact dermatitis, unspecified cause: Secondary | ICD-10-CM | POA: Diagnosis not present

## 2012-06-04 DIAGNOSIS — L408 Other psoriasis: Secondary | ICD-10-CM | POA: Diagnosis not present

## 2012-06-12 ENCOUNTER — Encounter (INDEPENDENT_AMBULATORY_CARE_PROVIDER_SITE_OTHER): Payer: Self-pay

## 2012-06-17 ENCOUNTER — Other Ambulatory Visit: Payer: Self-pay

## 2012-06-17 DIAGNOSIS — Z1231 Encounter for screening mammogram for malignant neoplasm of breast: Secondary | ICD-10-CM

## 2012-06-25 DIAGNOSIS — L219 Seborrheic dermatitis, unspecified: Secondary | ICD-10-CM | POA: Diagnosis not present

## 2012-06-30 DIAGNOSIS — E213 Hyperparathyroidism, unspecified: Secondary | ICD-10-CM | POA: Diagnosis not present

## 2012-06-30 DIAGNOSIS — E039 Hypothyroidism, unspecified: Secondary | ICD-10-CM | POA: Diagnosis not present

## 2012-07-01 DIAGNOSIS — B373 Candidiasis of vulva and vagina: Secondary | ICD-10-CM | POA: Diagnosis not present

## 2012-07-01 DIAGNOSIS — B3749 Other urogenital candidiasis: Secondary | ICD-10-CM | POA: Diagnosis not present

## 2012-07-01 DIAGNOSIS — Z01419 Encounter for gynecological examination (general) (routine) without abnormal findings: Secondary | ICD-10-CM | POA: Diagnosis not present

## 2012-07-01 DIAGNOSIS — B3731 Acute candidiasis of vulva and vagina: Secondary | ICD-10-CM | POA: Diagnosis not present

## 2012-07-01 DIAGNOSIS — N390 Urinary tract infection, site not specified: Secondary | ICD-10-CM | POA: Diagnosis not present

## 2012-07-22 DIAGNOSIS — Z1211 Encounter for screening for malignant neoplasm of colon: Secondary | ICD-10-CM | POA: Diagnosis not present

## 2012-07-22 DIAGNOSIS — Z86010 Personal history of colonic polyps: Secondary | ICD-10-CM | POA: Diagnosis not present

## 2012-07-22 DIAGNOSIS — K7689 Other specified diseases of liver: Secondary | ICD-10-CM | POA: Diagnosis not present

## 2012-07-22 DIAGNOSIS — K219 Gastro-esophageal reflux disease without esophagitis: Secondary | ICD-10-CM | POA: Diagnosis not present

## 2012-07-28 ENCOUNTER — Ambulatory Visit
Admission: RE | Admit: 2012-07-28 | Discharge: 2012-07-28 | Disposition: A | Discharge status: Home / Self Care | Payer: Medicare Other | Source: Ambulatory Visit

## 2012-07-28 DIAGNOSIS — Z1231 Encounter for screening mammogram for malignant neoplasm of breast: Secondary | ICD-10-CM

## 2012-08-08 DIAGNOSIS — E039 Hypothyroidism, unspecified: Secondary | ICD-10-CM | POA: Diagnosis not present

## 2012-08-11 DIAGNOSIS — R7301 Impaired fasting glucose: Secondary | ICD-10-CM | POA: Diagnosis not present

## 2012-08-11 DIAGNOSIS — E213 Hyperparathyroidism, unspecified: Secondary | ICD-10-CM | POA: Diagnosis not present

## 2012-08-11 DIAGNOSIS — E559 Vitamin D deficiency, unspecified: Secondary | ICD-10-CM | POA: Diagnosis not present

## 2012-08-11 DIAGNOSIS — E039 Hypothyroidism, unspecified: Secondary | ICD-10-CM | POA: Diagnosis not present

## 2012-08-25 DIAGNOSIS — F312 Bipolar disorder, current episode manic severe with psychotic features: Secondary | ICD-10-CM | POA: Diagnosis not present

## 2012-08-27 DIAGNOSIS — F312 Bipolar disorder, current episode manic severe with psychotic features: Secondary | ICD-10-CM | POA: Diagnosis not present

## 2012-09-17 ENCOUNTER — Other Ambulatory Visit: Payer: Self-pay

## 2012-09-22 DIAGNOSIS — R7301 Impaired fasting glucose: Secondary | ICD-10-CM | POA: Diagnosis not present

## 2012-09-22 DIAGNOSIS — E559 Vitamin D deficiency, unspecified: Secondary | ICD-10-CM | POA: Diagnosis not present

## 2012-09-22 DIAGNOSIS — E039 Hypothyroidism, unspecified: Secondary | ICD-10-CM | POA: Diagnosis not present

## 2012-09-22 DIAGNOSIS — E785 Hyperlipidemia, unspecified: Secondary | ICD-10-CM | POA: Diagnosis not present

## 2012-09-29 DIAGNOSIS — E785 Hyperlipidemia, unspecified: Secondary | ICD-10-CM | POA: Diagnosis not present

## 2012-09-29 DIAGNOSIS — R7301 Impaired fasting glucose: Secondary | ICD-10-CM | POA: Diagnosis not present

## 2012-09-29 DIAGNOSIS — E870 Hyperosmolality and hypernatremia: Secondary | ICD-10-CM | POA: Diagnosis not present

## 2012-09-29 DIAGNOSIS — N184 Chronic kidney disease, stage 4 (severe): Secondary | ICD-10-CM | POA: Diagnosis not present

## 2012-10-01 DIAGNOSIS — Z86010 Personal history of colonic polyps: Secondary | ICD-10-CM | POA: Diagnosis not present

## 2012-10-01 DIAGNOSIS — Z800 Family history of malignant neoplasm of digestive organs: Secondary | ICD-10-CM | POA: Diagnosis not present

## 2012-10-01 DIAGNOSIS — D128 Benign neoplasm of rectum: Secondary | ICD-10-CM | POA: Diagnosis not present

## 2012-10-01 DIAGNOSIS — D126 Benign neoplasm of colon, unspecified: Secondary | ICD-10-CM | POA: Diagnosis not present

## 2012-10-01 DIAGNOSIS — Z1211 Encounter for screening for malignant neoplasm of colon: Secondary | ICD-10-CM | POA: Diagnosis not present

## 2012-10-01 LAB — HM COLONOSCOPY

## 2012-10-21 ENCOUNTER — Encounter (INDEPENDENT_AMBULATORY_CARE_PROVIDER_SITE_OTHER): Payer: Self-pay

## 2012-11-06 ENCOUNTER — Emergency Department (INDEPENDENT_AMBULATORY_CARE_PROVIDER_SITE_OTHER)
Admission: EM | Admit: 2012-11-06 | Discharge: 2012-11-06 | Disposition: A | Discharge status: Home / Self Care | Payer: Medicare Other | Source: Home / Self Care | Attending: Family Medicine | Admitting: Family Medicine

## 2012-11-06 ENCOUNTER — Encounter (HOSPITAL_COMMUNITY): Payer: Self-pay | Admitting: Emergency Medicine

## 2012-11-06 DIAGNOSIS — S76111A Strain of right quadriceps muscle, fascia and tendon, initial encounter: Secondary | ICD-10-CM

## 2012-11-06 DIAGNOSIS — IMO0002 Reserved for concepts with insufficient information to code with codable children: Secondary | ICD-10-CM | POA: Diagnosis not present

## 2012-11-06 MED ORDER — TRAMADOL HCL 50 MG PO TABS: 50.0000 mg | ORAL_TABLET | Freq: Four times a day (QID) | ORAL | Status: DC | PRN

## 2012-11-06 MED ORDER — CYCLOBENZAPRINE HCL 10 MG PO TABS: 10.0000 mg | ORAL_TABLET | Freq: Two times a day (BID) | ORAL | Status: DC | PRN

## 2012-11-06 MED ORDER — IBUPROFEN 600 MG PO TABS: 600.0000 mg | ORAL_TABLET | Freq: Three times a day (TID) | ORAL | Status: DC

## 2012-11-06 NOTE — ED Notes (Signed)
Left without being discharged. Called home phone number and spoke to husband. He will send her back to get her paperwork. Not answering cell phone.

## 2012-11-06 NOTE — ED Notes (Signed)
Pt c/o fall x 5 days ago. Fell and slipped in a puddle. Pt reports she has pain when she bends her right knee that radiates up to her thigh. Has mild bruising and a scar on her right knee. Pt reports ADLs are causing her pain like driving, walking around the house, etc. Pt is alert and oriented.

## 2012-11-11 NOTE — ED Provider Notes (Signed)
CSN: [id number]     Arrival date & time 11/06/12  1011 History   First MD Initiated Contact with Patient 11/06/12 1046     Chief Complaint  Patient presents with  . Fall   (Consider location/radiation/quality/duration/timing/severity/associated sxs/prior Treatment) HPI Comments: 67 y/o female here c/o right upper thigh pain for 5 days after she slipped in a water puddle [place]. Was able to get up and walk after her injury. She had a scrape on top of her right knee that is almost healed now. Knee is not swollen or hurting but the thigh above hurts with lower leg bending and extension. No pain if standing still putting weight in both legs.    Past Medical History  Diagnosis Date  . Thyroid disease     HX OF RADIOACTIVE IODINE TX MANY YRS FOR GRAVES DISEASE  . Hypercalcemia   . Osteoporosis   . Depression   . Hyperlipidemia   . GERD (gastroesophageal reflux disease)   . Chills   . Abdominal pain   . Constipation   . Weakness   . Change in voice     HOARSINESS FOR PAST YEAR  . Weight loss, unintentional   . Hyperparathyroidism   . Hypothyroidism   . Stroke DEC 2013    HX OF STOPPING LITHIUM BECAUSE OF HYPERCALCEMIA--THAT MADE PT MANIC--HOSPITALIZED -SUFFERED FALL / CT SCAN AND MRI SHOWED PT HAD HAD STROKE AT SOME TIIME IN THE PAST.--PT NEVER HAD ANY KNOWLEDGE OF HAVING STROKE-NO SX'S   Past Surgical History  Procedure Laterality Date  . Tubal ligation  1978  . Abdominal hysterectomy  1986  . Transvaginal mesh  2005  . Parathyroidectomy Left 03/27/2012    Procedure: Left Inferior Mininally Invasive PARATHYROIDECTOMY;  Surgeon: Velora Heckler, MD;  Location: [facility];  Service: General;  Laterality: Left;  Left Inferior Mininally Invasive Parathyroidectomy   Family History  Problem Relation Age of Onset  . Cancer Mother   . Cancer Maternal Aunt     breast   History  Substance Use Topics  . Smoking status: Former Smoker -- 1.00 packs/day for 30 years     Types: Cigarettes    Quit date: 03/18/1997  . Smokeless tobacco: Never Used  . Alcohol Use: No   OB History   Grav Para Term Preterm Abortions TAB SAB Ect Mult Living                 Review of Systems  Constitutional: Negative for fever and chills.  HENT:       No neck trauma  Musculoskeletal: Positive for myalgias. Negative for joint swelling.       As per HPI  Skin: Negative for color change, pallor and rash.  Neurological:       No head trauma  All other systems reviewed and are negative.    Allergies  Sulfa antibiotics and Trimethoprim  Home Medications   Current Outpatient Rx  Name  Route  Sig  Dispense  Refill  . cholecalciferol (VITAMIN D) 1000 UNITS tablet   Oral   Take 1,000 Units by mouth daily.         . divalproex (DEPAKOTE ER) 250 MG 24 hr tablet   Oral   Take 250 mg by mouth daily.         Marland Kitchen levothyroxine (SYNTHROID, LEVOTHROID) 100 MCG tablet   Oral   Take 100 mcg by mouth daily.         Marland Kitchen acetaminophen (  TYLENOL) 500 MG tablet   Oral   Take 500 mg by mouth every 6 (six) hours as needed. For pain         . cyclobenzaprine (FLEXERIL) 10 MG tablet   Oral   Take 1 tablet (10 mg total) by mouth 2 (two) times daily as needed for muscle spasms.   20 tablet   0   . estrogens, conjugated, (PREMARIN) 0.625 MG tablet   Oral   Take 0.625 mg by mouth daily.          Marland Kitchen ibuprofen (ADVIL,MOTRIN) 600 MG tablet   Oral   Take 1 tablet (600 mg total) by mouth every 8 (eight) hours. Take with food   21 tablet   0   . traMADol (ULTRAM) 50 MG tablet   Oral   Take 1 tablet (50 mg total) by mouth every 6 (six) hours as needed for pain.   15 tablet   0    BP 111/68  Pulse 78  Temp(Src) 98.2 F (36.8 C) (Oral)  Resp 18  SpO2 98% Physical Exam  Nursing note and vitals reviewed. Constitutional: She is oriented to person, place, and time. She appears well-developed and well-nourished. No distress.  HENT:  Head: Normocephalic and atraumatic.   Neck: Normal range of motion. Neck supple.  Cardiovascular: Normal heart sounds.   Pulmonary/Chest: Breath sounds normal.  Musculoskeletal:  Right thigh no deformity or bruising no obvious swelling. No fluctuations. There is tenderness with palpation and increased tone of the quadriceps sural worse with active lower leg extension.  Normal right knee exam.  Neurological: She is alert and oriented to person, place, and time.  entire right lower extremity appears grossly neurovascularly intact.  Skin: No rash noted. She is not diaphoretic.  Superficial abrasion on top of right knee in healing stages. No signs of infection.    ED Course  Procedures (including critical care time) Labs Review Labs Reviewed - No data to display Imaging Review No results found.  MDM   1. Quadriceps muscle strain, right, initial encounter    Placed on ace wrap with reported pain improvement. Prescribed tramadol, ibuprofen  And flexeril. Supportive care including rehabilitation exercises and red flags that should prompt return to medical attention discussed with patient and provided in writing.     Sharin Grave, MD 11/11/12 [phone]

## 2012-11-26 DIAGNOSIS — F312 Bipolar disorder, current episode manic severe with psychotic features: Secondary | ICD-10-CM | POA: Diagnosis not present

## 2012-12-18 ENCOUNTER — Other Ambulatory Visit: Payer: Self-pay

## 2013-02-13 DIAGNOSIS — R7301 Impaired fasting glucose: Secondary | ICD-10-CM | POA: Diagnosis not present

## 2013-02-13 DIAGNOSIS — E039 Hypothyroidism, unspecified: Secondary | ICD-10-CM | POA: Diagnosis not present

## 2013-02-13 DIAGNOSIS — E559 Vitamin D deficiency, unspecified: Secondary | ICD-10-CM | POA: Diagnosis not present

## 2013-02-13 DIAGNOSIS — E213 Hyperparathyroidism, unspecified: Secondary | ICD-10-CM | POA: Diagnosis not present

## 2013-02-18 DIAGNOSIS — R7401 Elevation of levels of liver transaminase levels: Secondary | ICD-10-CM | POA: Diagnosis not present

## 2013-02-18 DIAGNOSIS — R7402 Elevation of levels of lactic acid dehydrogenase (LDH): Secondary | ICD-10-CM | POA: Diagnosis not present

## 2013-02-18 DIAGNOSIS — E213 Hyperparathyroidism, unspecified: Secondary | ICD-10-CM | POA: Diagnosis not present

## 2013-02-18 DIAGNOSIS — R7301 Impaired fasting glucose: Secondary | ICD-10-CM | POA: Diagnosis not present

## 2013-02-18 DIAGNOSIS — E039 Hypothyroidism, unspecified: Secondary | ICD-10-CM | POA: Diagnosis not present

## 2013-02-25 DIAGNOSIS — F312 Bipolar disorder, current episode manic severe with psychotic features: Secondary | ICD-10-CM | POA: Diagnosis not present

## 2013-02-26 DIAGNOSIS — F312 Bipolar disorder, current episode manic severe with psychotic features: Secondary | ICD-10-CM | POA: Diagnosis not present

## 2013-03-30 DIAGNOSIS — M810 Age-related osteoporosis without current pathological fracture: Secondary | ICD-10-CM | POA: Diagnosis not present

## 2013-03-30 DIAGNOSIS — R7301 Impaired fasting glucose: Secondary | ICD-10-CM | POA: Diagnosis not present

## 2013-03-30 DIAGNOSIS — Z0000 Encounter for general adult medical examination without abnormal findings: Secondary | ICD-10-CM | POA: Diagnosis not present

## 2013-03-30 DIAGNOSIS — E785 Hyperlipidemia, unspecified: Secondary | ICD-10-CM | POA: Diagnosis not present

## 2013-04-06 DIAGNOSIS — E039 Hypothyroidism, unspecified: Secondary | ICD-10-CM | POA: Diagnosis not present

## 2013-04-06 DIAGNOSIS — E785 Hyperlipidemia, unspecified: Secondary | ICD-10-CM | POA: Diagnosis not present

## 2013-04-06 DIAGNOSIS — R7301 Impaired fasting glucose: Secondary | ICD-10-CM | POA: Diagnosis not present

## 2013-04-06 DIAGNOSIS — N184 Chronic kidney disease, stage 4 (severe): Secondary | ICD-10-CM | POA: Diagnosis not present

## 2013-04-27 DIAGNOSIS — H6120 Impacted cerumen, unspecified ear: Secondary | ICD-10-CM | POA: Diagnosis not present

## 2013-05-06 DIAGNOSIS — H7290 Unspecified perforation of tympanic membrane, unspecified ear: Secondary | ICD-10-CM | POA: Diagnosis not present

## 2013-05-06 DIAGNOSIS — H902 Conductive hearing loss, unspecified: Secondary | ICD-10-CM | POA: Diagnosis not present

## 2013-05-20 DIAGNOSIS — F312 Bipolar disorder, current episode manic severe with psychotic features: Secondary | ICD-10-CM | POA: Diagnosis not present

## 2013-06-04 DIAGNOSIS — Z8669 Personal history of other diseases of the nervous system and sense organs: Secondary | ICD-10-CM | POA: Diagnosis not present

## 2013-06-04 DIAGNOSIS — H6240 Otitis externa in other diseases classified elsewhere, unspecified ear: Secondary | ICD-10-CM | POA: Diagnosis not present

## 2013-06-04 DIAGNOSIS — H6120 Impacted cerumen, unspecified ear: Secondary | ICD-10-CM | POA: Diagnosis not present

## 2013-06-04 DIAGNOSIS — B369 Superficial mycosis, unspecified: Secondary | ICD-10-CM | POA: Diagnosis not present

## 2013-06-23 DIAGNOSIS — H6240 Otitis externa in other diseases classified elsewhere, unspecified ear: Secondary | ICD-10-CM | POA: Diagnosis not present

## 2013-06-23 DIAGNOSIS — H6120 Impacted cerumen, unspecified ear: Secondary | ICD-10-CM | POA: Diagnosis not present

## 2013-06-23 DIAGNOSIS — B369 Superficial mycosis, unspecified: Secondary | ICD-10-CM | POA: Diagnosis not present

## 2013-06-30 DIAGNOSIS — R7301 Impaired fasting glucose: Secondary | ICD-10-CM | POA: Diagnosis not present

## 2013-06-30 DIAGNOSIS — E785 Hyperlipidemia, unspecified: Secondary | ICD-10-CM | POA: Diagnosis not present

## 2013-06-30 DIAGNOSIS — E039 Hypothyroidism, unspecified: Secondary | ICD-10-CM | POA: Diagnosis not present

## 2013-07-03 ENCOUNTER — Other Ambulatory Visit: Payer: Self-pay

## 2013-07-03 DIAGNOSIS — Z1231 Encounter for screening mammogram for malignant neoplasm of breast: Secondary | ICD-10-CM

## 2013-07-07 DIAGNOSIS — N184 Chronic kidney disease, stage 4 (severe): Secondary | ICD-10-CM | POA: Diagnosis not present

## 2013-07-07 DIAGNOSIS — R7301 Impaired fasting glucose: Secondary | ICD-10-CM | POA: Diagnosis not present

## 2013-07-07 DIAGNOSIS — E039 Hypothyroidism, unspecified: Secondary | ICD-10-CM | POA: Diagnosis not present

## 2013-07-07 DIAGNOSIS — E785 Hyperlipidemia, unspecified: Secondary | ICD-10-CM | POA: Diagnosis not present

## 2013-07-20 DIAGNOSIS — B369 Superficial mycosis, unspecified: Secondary | ICD-10-CM | POA: Diagnosis not present

## 2013-07-20 DIAGNOSIS — H6240 Otitis externa in other diseases classified elsewhere, unspecified ear: Secondary | ICD-10-CM | POA: Diagnosis not present

## 2013-07-30 ENCOUNTER — Ambulatory Visit
Admission: RE | Admit: 2013-07-30 | Discharge: 2013-07-30 | Disposition: A | Discharge status: Home / Self Care | Payer: Medicare Other | Source: Ambulatory Visit

## 2013-07-30 DIAGNOSIS — Z1231 Encounter for screening mammogram for malignant neoplasm of breast: Secondary | ICD-10-CM

## 2013-08-12 DIAGNOSIS — F312 Bipolar disorder, current episode manic severe with psychotic features: Secondary | ICD-10-CM | POA: Diagnosis not present

## 2013-08-25 ENCOUNTER — Ambulatory Visit (HOSPITAL_COMMUNITY)
Admission: RE | Admit: 2013-08-25 | Discharge: 2013-08-25 | Disposition: A | Discharge status: Home / Self Care | Payer: Medicare Other | Source: Ambulatory Visit | Attending: Vascular Surgery | Admitting: Vascular Surgery

## 2013-08-25 ENCOUNTER — Other Ambulatory Visit (HOSPITAL_COMMUNITY): Payer: Self-pay | Admitting: Internal Medicine

## 2013-08-25 DIAGNOSIS — R609 Edema, unspecified: Secondary | ICD-10-CM

## 2013-08-25 DIAGNOSIS — Z8249 Family history of ischemic heart disease and other diseases of the circulatory system: Secondary | ICD-10-CM | POA: Diagnosis not present

## 2013-08-25 DIAGNOSIS — R600 Localized edema: Secondary | ICD-10-CM

## 2013-09-21 ENCOUNTER — Encounter: Payer: Self-pay | Admitting: Podiatry

## 2013-09-21 ENCOUNTER — Ambulatory Visit (INDEPENDENT_AMBULATORY_CARE_PROVIDER_SITE_OTHER): Payer: Medicare Other | Admitting: Podiatry

## 2013-09-21 ENCOUNTER — Ambulatory Visit (INDEPENDENT_AMBULATORY_CARE_PROVIDER_SITE_OTHER): Payer: Medicare Other

## 2013-09-21 VITALS — BP 107/58 | HR 68 | Resp 16

## 2013-09-21 DIAGNOSIS — M8430XA Stress fracture, unspecified site, initial encounter for fracture: Secondary | ICD-10-CM

## 2013-09-21 DIAGNOSIS — R609 Edema, unspecified: Secondary | ICD-10-CM

## 2013-09-21 NOTE — Progress Notes (Signed)
Subjective:     Patient ID: [name], female   DOB: 09-Aug-1945, 68 y.o.   MRN: [id number]  Foot Pain   patient states she's had swelling in her left foot since July 5 thought maybe she had been bitten by a spider or insect. States it's not been sore   Review of Systems  All other systems reviewed and are negative.      Objective:   Physical Exam  Nursing note and vitals reviewed. Constitutional: She is oriented to person, place, and time.  Cardiovascular: Intact distal pulses.   Musculoskeletal: Normal range of motion.  Neurological: She is oriented to person, place, and time.  Skin: Skin is warm.   neurovascular status intact with muscle strength adequate and range of motion of the subtalar and midtarsal joint within normal limits. Patient has forefoot edema left with +1 and +2 pitting edema within the foot and has a negative Homans sign noted. Patient did not have significant discomfort when palpated and did have good digital perfusion     Assessment:     Some form of inflammatory process occurring left forefoot that most likely is some form of trauma or possible insect bite    Plan:     H&P and x-rays reviewed. Today I applied Unna boot Ace wrap in order to reduce the swelling and advised on leaving on for 3-4 days and less the toe should turn color or she would remove it immediately. We will then begin Ace wrap and anklet usage and elevation and she'll reappoint if swelling persist

## 2013-09-21 NOTE — Progress Notes (Signed)
   Subjective:    Patient ID: [name], female    DOB: [birth year], 68 y.o.   MRN: [id number]  HPI Comments: "I have swelling in my foot"  Patient c/o tenderness forefoot left since July 5th. No injury. The area stays swollen and slightly red. She has been rubbing with alcohol. No help.  PCP did ultrasound to check for clots-results were negative.  Foot Pain      Review of Systems  All other systems reviewed and are negative.      Objective:   Physical Exam        Assessment & Plan:

## 2013-09-29 DIAGNOSIS — R7301 Impaired fasting glucose: Secondary | ICD-10-CM | POA: Diagnosis not present

## 2013-09-29 DIAGNOSIS — E039 Hypothyroidism, unspecified: Secondary | ICD-10-CM | POA: Diagnosis not present

## 2013-09-29 DIAGNOSIS — M810 Age-related osteoporosis without current pathological fracture: Secondary | ICD-10-CM | POA: Diagnosis not present

## 2013-09-29 DIAGNOSIS — E785 Hyperlipidemia, unspecified: Secondary | ICD-10-CM | POA: Diagnosis not present

## 2013-10-06 DIAGNOSIS — E039 Hypothyroidism, unspecified: Secondary | ICD-10-CM | POA: Diagnosis not present

## 2013-10-06 DIAGNOSIS — E785 Hyperlipidemia, unspecified: Secondary | ICD-10-CM | POA: Diagnosis not present

## 2013-10-06 DIAGNOSIS — N184 Chronic kidney disease, stage 4 (severe): Secondary | ICD-10-CM | POA: Diagnosis not present

## 2013-10-06 DIAGNOSIS — R7301 Impaired fasting glucose: Secondary | ICD-10-CM | POA: Diagnosis not present

## 2013-11-05 DIAGNOSIS — F312 Bipolar disorder, current episode manic severe with psychotic features: Secondary | ICD-10-CM | POA: Diagnosis not present

## 2014-01-27 DIAGNOSIS — F3174 Bipolar disorder, in full remission, most recent episode manic: Secondary | ICD-10-CM | POA: Diagnosis not present

## 2014-01-28 DIAGNOSIS — F3174 Bipolar disorder, in full remission, most recent episode manic: Secondary | ICD-10-CM | POA: Diagnosis not present

## 2014-02-18 DIAGNOSIS — E784 Other hyperlipidemia: Secondary | ICD-10-CM | POA: Diagnosis not present

## 2014-02-18 DIAGNOSIS — E039 Hypothyroidism, unspecified: Secondary | ICD-10-CM | POA: Diagnosis not present

## 2014-02-18 DIAGNOSIS — R7301 Impaired fasting glucose: Secondary | ICD-10-CM | POA: Diagnosis not present

## 2014-02-18 DIAGNOSIS — M810 Age-related osteoporosis without current pathological fracture: Secondary | ICD-10-CM | POA: Diagnosis not present

## 2014-02-18 DIAGNOSIS — E559 Vitamin D deficiency, unspecified: Secondary | ICD-10-CM | POA: Diagnosis not present

## 2014-02-24 DIAGNOSIS — E780 Pure hypercholesterolemia: Secondary | ICD-10-CM | POA: Diagnosis not present

## 2014-02-24 DIAGNOSIS — R7301 Impaired fasting glucose: Secondary | ICD-10-CM | POA: Diagnosis not present

## 2014-02-24 DIAGNOSIS — E039 Hypothyroidism, unspecified: Secondary | ICD-10-CM | POA: Diagnosis not present

## 2014-02-24 DIAGNOSIS — E559 Vitamin D deficiency, unspecified: Secondary | ICD-10-CM | POA: Diagnosis not present

## 2014-04-01 DIAGNOSIS — Z0000 Encounter for general adult medical examination without abnormal findings: Secondary | ICD-10-CM | POA: Diagnosis not present

## 2014-04-01 DIAGNOSIS — E785 Hyperlipidemia, unspecified: Secondary | ICD-10-CM | POA: Diagnosis not present

## 2014-04-01 DIAGNOSIS — Z23 Encounter for immunization: Secondary | ICD-10-CM | POA: Diagnosis not present

## 2014-04-01 DIAGNOSIS — Z1389 Encounter for screening for other disorder: Secondary | ICD-10-CM | POA: Diagnosis not present

## 2014-04-12 DIAGNOSIS — E785 Hyperlipidemia, unspecified: Secondary | ICD-10-CM | POA: Diagnosis not present

## 2014-04-12 DIAGNOSIS — E039 Hypothyroidism, unspecified: Secondary | ICD-10-CM | POA: Diagnosis not present

## 2014-04-12 DIAGNOSIS — R7301 Impaired fasting glucose: Secondary | ICD-10-CM | POA: Diagnosis not present

## 2014-04-12 DIAGNOSIS — N184 Chronic kidney disease, stage 4 (severe): Secondary | ICD-10-CM | POA: Diagnosis not present

## 2014-04-19 DIAGNOSIS — F3174 Bipolar disorder, in full remission, most recent episode manic: Secondary | ICD-10-CM | POA: Diagnosis not present

## 2014-06-28 ENCOUNTER — Other Ambulatory Visit: Payer: Self-pay

## 2014-06-28 DIAGNOSIS — Z1231 Encounter for screening mammogram for malignant neoplasm of breast: Secondary | ICD-10-CM

## 2014-07-08 DIAGNOSIS — H608X3 Other otitis externa, bilateral: Secondary | ICD-10-CM | POA: Diagnosis not present

## 2014-07-08 DIAGNOSIS — H9313 Tinnitus, bilateral: Secondary | ICD-10-CM | POA: Diagnosis not present

## 2014-07-14 DIAGNOSIS — F3174 Bipolar disorder, in full remission, most recent episode manic: Secondary | ICD-10-CM | POA: Diagnosis not present

## 2014-08-02 ENCOUNTER — Ambulatory Visit
Admission: RE | Admit: 2014-08-02 | Discharge: 2014-08-02 | Disposition: A | Discharge status: Home / Self Care | Payer: Medicare Other | Source: Ambulatory Visit

## 2014-08-02 DIAGNOSIS — Z1231 Encounter for screening mammogram for malignant neoplasm of breast: Secondary | ICD-10-CM

## 2014-08-09 ENCOUNTER — Other Ambulatory Visit: Payer: Self-pay

## 2014-10-01 DIAGNOSIS — E039 Hypothyroidism, unspecified: Secondary | ICD-10-CM | POA: Diagnosis not present

## 2014-10-01 DIAGNOSIS — M810 Age-related osteoporosis without current pathological fracture: Secondary | ICD-10-CM | POA: Diagnosis not present

## 2014-10-01 DIAGNOSIS — F319 Bipolar disorder, unspecified: Secondary | ICD-10-CM | POA: Diagnosis not present

## 2014-10-01 DIAGNOSIS — E213 Hyperparathyroidism, unspecified: Secondary | ICD-10-CM | POA: Diagnosis not present

## 2014-10-01 DIAGNOSIS — R7301 Impaired fasting glucose: Secondary | ICD-10-CM | POA: Diagnosis not present

## 2014-10-01 DIAGNOSIS — E785 Hyperlipidemia, unspecified: Secondary | ICD-10-CM | POA: Diagnosis not present

## 2014-10-15 DIAGNOSIS — R930 Abnormal findings on diagnostic imaging of skull and head, not elsewhere classified: Secondary | ICD-10-CM | POA: Diagnosis not present

## 2014-10-15 DIAGNOSIS — Z794 Long term (current) use of insulin: Secondary | ICD-10-CM | POA: Diagnosis not present

## 2014-10-15 DIAGNOSIS — Z79899 Other long term (current) drug therapy: Secondary | ICD-10-CM | POA: Diagnosis not present

## 2014-10-15 DIAGNOSIS — R42 Dizziness and giddiness: Secondary | ICD-10-CM | POA: Diagnosis not present

## 2014-10-15 DIAGNOSIS — R748 Abnormal levels of other serum enzymes: Secondary | ICD-10-CM | POA: Diagnosis not present

## 2014-10-15 DIAGNOSIS — E870 Hyperosmolality and hypernatremia: Secondary | ICD-10-CM | POA: Diagnosis not present

## 2014-10-15 DIAGNOSIS — Z886 Allergy status to analgesic agent status: Secondary | ICD-10-CM | POA: Diagnosis not present

## 2014-10-15 DIAGNOSIS — E210 Primary hyperparathyroidism: Secondary | ICD-10-CM | POA: Diagnosis present

## 2014-10-15 DIAGNOSIS — E861 Hypovolemia: Secondary | ICD-10-CM | POA: Diagnosis present

## 2014-10-15 DIAGNOSIS — Z882 Allergy status to sulfonamides status: Secondary | ICD-10-CM | POA: Diagnosis not present

## 2014-10-15 DIAGNOSIS — E1310 Other specified diabetes mellitus with ketoacidosis without coma: Secondary | ICD-10-CM | POA: Diagnosis not present

## 2014-10-15 DIAGNOSIS — F319 Bipolar disorder, unspecified: Secondary | ICD-10-CM | POA: Diagnosis not present

## 2014-10-15 DIAGNOSIS — R404 Transient alteration of awareness: Secondary | ICD-10-CM | POA: Diagnosis not present

## 2014-10-15 DIAGNOSIS — R4182 Altered mental status, unspecified: Secondary | ICD-10-CM | POA: Diagnosis not present

## 2014-10-15 DIAGNOSIS — Z7902 Long term (current) use of antithrombotics/antiplatelets: Secondary | ICD-10-CM | POA: Diagnosis not present

## 2014-10-15 DIAGNOSIS — E872 Acidosis: Secondary | ICD-10-CM | POA: Diagnosis not present

## 2014-10-15 DIAGNOSIS — R739 Hyperglycemia, unspecified: Secondary | ICD-10-CM | POA: Diagnosis not present

## 2014-10-15 DIAGNOSIS — G939 Disorder of brain, unspecified: Secondary | ICD-10-CM | POA: Diagnosis not present

## 2014-10-15 DIAGNOSIS — Z833 Family history of diabetes mellitus: Secondary | ICD-10-CM | POA: Diagnosis not present

## 2014-10-15 DIAGNOSIS — E039 Hypothyroidism, unspecified: Secondary | ICD-10-CM | POA: Diagnosis not present

## 2014-10-15 DIAGNOSIS — E860 Dehydration: Secondary | ICD-10-CM | POA: Diagnosis not present

## 2014-10-15 DIAGNOSIS — E1100 Type 2 diabetes mellitus with hyperosmolarity without nonketotic hyperglycemic-hyperosmolar coma (NKHHC): Secondary | ICD-10-CM | POA: Diagnosis not present

## 2014-10-15 DIAGNOSIS — I499 Cardiac arrhythmia, unspecified: Secondary | ICD-10-CM | POA: Diagnosis not present

## 2014-10-15 DIAGNOSIS — E119 Type 2 diabetes mellitus without complications: Secondary | ICD-10-CM | POA: Diagnosis not present

## 2014-10-15 DIAGNOSIS — R109 Unspecified abdominal pain: Secondary | ICD-10-CM | POA: Diagnosis not present

## 2014-10-15 DIAGNOSIS — N189 Chronic kidney disease, unspecified: Secondary | ICD-10-CM | POA: Diagnosis present

## 2014-10-15 DIAGNOSIS — D696 Thrombocytopenia, unspecified: Secondary | ICD-10-CM | POA: Diagnosis not present

## 2014-10-15 DIAGNOSIS — G9340 Encephalopathy, unspecified: Secondary | ICD-10-CM | POA: Diagnosis not present

## 2014-10-15 DIAGNOSIS — K8020 Calculus of gallbladder without cholecystitis without obstruction: Secondary | ICD-10-CM | POA: Diagnosis not present

## 2014-10-15 DIAGNOSIS — N179 Acute kidney failure, unspecified: Secondary | ICD-10-CM | POA: Diagnosis not present

## 2014-10-19 DIAGNOSIS — E119 Type 2 diabetes mellitus without complications: Secondary | ICD-10-CM | POA: Insufficient documentation

## 2014-10-19 DIAGNOSIS — R7303 Prediabetes: Secondary | ICD-10-CM | POA: Insufficient documentation

## 2014-10-22 DIAGNOSIS — E785 Hyperlipidemia, unspecified: Secondary | ICD-10-CM | POA: Diagnosis not present

## 2014-10-22 DIAGNOSIS — E213 Hyperparathyroidism, unspecified: Secondary | ICD-10-CM | POA: Diagnosis not present

## 2014-10-22 DIAGNOSIS — F319 Bipolar disorder, unspecified: Secondary | ICD-10-CM | POA: Diagnosis not present

## 2014-10-22 DIAGNOSIS — E039 Hypothyroidism, unspecified: Secondary | ICD-10-CM | POA: Diagnosis not present

## 2014-10-23 DIAGNOSIS — N189 Chronic kidney disease, unspecified: Secondary | ICD-10-CM | POA: Diagnosis not present

## 2014-10-23 DIAGNOSIS — I499 Cardiac arrhythmia, unspecified: Secondary | ICD-10-CM | POA: Diagnosis not present

## 2014-10-23 DIAGNOSIS — Z794 Long term (current) use of insulin: Secondary | ICD-10-CM | POA: Diagnosis not present

## 2014-10-23 DIAGNOSIS — D696 Thrombocytopenia, unspecified: Secondary | ICD-10-CM | POA: Diagnosis not present

## 2014-10-23 DIAGNOSIS — E1165 Type 2 diabetes mellitus with hyperglycemia: Secondary | ICD-10-CM | POA: Diagnosis not present

## 2014-10-23 DIAGNOSIS — F319 Bipolar disorder, unspecified: Secondary | ICD-10-CM | POA: Diagnosis not present

## 2014-10-25 DIAGNOSIS — F319 Bipolar disorder, unspecified: Secondary | ICD-10-CM | POA: Diagnosis not present

## 2014-10-25 DIAGNOSIS — N189 Chronic kidney disease, unspecified: Secondary | ICD-10-CM | POA: Diagnosis not present

## 2014-10-25 DIAGNOSIS — D696 Thrombocytopenia, unspecified: Secondary | ICD-10-CM | POA: Diagnosis not present

## 2014-10-25 DIAGNOSIS — E1165 Type 2 diabetes mellitus with hyperglycemia: Secondary | ICD-10-CM | POA: Diagnosis not present

## 2014-10-25 DIAGNOSIS — I499 Cardiac arrhythmia, unspecified: Secondary | ICD-10-CM | POA: Diagnosis not present

## 2014-10-25 DIAGNOSIS — Z794 Long term (current) use of insulin: Secondary | ICD-10-CM | POA: Diagnosis not present

## 2014-11-04 DIAGNOSIS — E119 Type 2 diabetes mellitus without complications: Secondary | ICD-10-CM | POA: Diagnosis not present

## 2014-11-12 DIAGNOSIS — Z794 Long term (current) use of insulin: Secondary | ICD-10-CM | POA: Diagnosis not present

## 2014-11-12 DIAGNOSIS — I499 Cardiac arrhythmia, unspecified: Secondary | ICD-10-CM | POA: Diagnosis not present

## 2014-11-12 DIAGNOSIS — N189 Chronic kidney disease, unspecified: Secondary | ICD-10-CM | POA: Diagnosis not present

## 2014-11-12 DIAGNOSIS — E1165 Type 2 diabetes mellitus with hyperglycemia: Secondary | ICD-10-CM | POA: Diagnosis not present

## 2014-11-12 DIAGNOSIS — D696 Thrombocytopenia, unspecified: Secondary | ICD-10-CM | POA: Diagnosis not present

## 2014-11-12 DIAGNOSIS — F319 Bipolar disorder, unspecified: Secondary | ICD-10-CM | POA: Diagnosis not present

## 2014-11-17 DIAGNOSIS — H43813 Vitreous degeneration, bilateral: Secondary | ICD-10-CM | POA: Diagnosis not present

## 2014-11-17 DIAGNOSIS — E119 Type 2 diabetes mellitus without complications: Secondary | ICD-10-CM | POA: Diagnosis not present

## 2014-11-17 DIAGNOSIS — H5203 Hypermetropia, bilateral: Secondary | ICD-10-CM | POA: Diagnosis not present

## 2014-11-17 DIAGNOSIS — H2513 Age-related nuclear cataract, bilateral: Secondary | ICD-10-CM | POA: Diagnosis not present

## 2014-11-17 DIAGNOSIS — F3174 Bipolar disorder, in full remission, most recent episode manic: Secondary | ICD-10-CM | POA: Diagnosis not present

## 2014-11-17 LAB — HM DIABETES EYE EXAM

## 2014-12-21 DIAGNOSIS — L659 Nonscarring hair loss, unspecified: Secondary | ICD-10-CM | POA: Diagnosis not present

## 2014-12-21 DIAGNOSIS — E119 Type 2 diabetes mellitus without complications: Secondary | ICD-10-CM | POA: Diagnosis not present

## 2014-12-30 DIAGNOSIS — N184 Chronic kidney disease, stage 4 (severe): Secondary | ICD-10-CM | POA: Diagnosis not present

## 2014-12-30 DIAGNOSIS — E785 Hyperlipidemia, unspecified: Secondary | ICD-10-CM | POA: Diagnosis not present

## 2015-01-04 DIAGNOSIS — E119 Type 2 diabetes mellitus without complications: Secondary | ICD-10-CM | POA: Diagnosis not present

## 2015-01-04 DIAGNOSIS — E785 Hyperlipidemia, unspecified: Secondary | ICD-10-CM | POA: Diagnosis not present

## 2015-01-21 DIAGNOSIS — L219 Seborrheic dermatitis, unspecified: Secondary | ICD-10-CM | POA: Diagnosis not present

## 2015-01-21 DIAGNOSIS — L650 Telogen effluvium: Secondary | ICD-10-CM | POA: Diagnosis not present

## 2015-01-21 DIAGNOSIS — L304 Erythema intertrigo: Secondary | ICD-10-CM | POA: Diagnosis not present

## 2015-02-22 DIAGNOSIS — F3174 Bipolar disorder, in full remission, most recent episode manic: Secondary | ICD-10-CM | POA: Diagnosis not present

## 2015-02-25 DIAGNOSIS — E7800 Pure hypercholesterolemia, unspecified: Secondary | ICD-10-CM | POA: Diagnosis not present

## 2015-02-25 DIAGNOSIS — E039 Hypothyroidism, unspecified: Secondary | ICD-10-CM | POA: Diagnosis not present

## 2015-02-25 DIAGNOSIS — R7301 Impaired fasting glucose: Secondary | ICD-10-CM | POA: Diagnosis not present

## 2015-02-25 DIAGNOSIS — E559 Vitamin D deficiency, unspecified: Secondary | ICD-10-CM | POA: Diagnosis not present

## 2015-03-04 DIAGNOSIS — E213 Hyperparathyroidism, unspecified: Secondary | ICD-10-CM | POA: Diagnosis not present

## 2015-03-04 DIAGNOSIS — N184 Chronic kidney disease, stage 4 (severe): Secondary | ICD-10-CM | POA: Diagnosis not present

## 2015-03-04 DIAGNOSIS — E119 Type 2 diabetes mellitus without complications: Secondary | ICD-10-CM | POA: Diagnosis not present

## 2015-03-04 DIAGNOSIS — E039 Hypothyroidism, unspecified: Secondary | ICD-10-CM | POA: Diagnosis not present

## 2015-04-05 DIAGNOSIS — Z1389 Encounter for screening for other disorder: Secondary | ICD-10-CM | POA: Diagnosis not present

## 2015-04-05 DIAGNOSIS — E785 Hyperlipidemia, unspecified: Secondary | ICD-10-CM | POA: Diagnosis not present

## 2015-04-05 DIAGNOSIS — E1122 Type 2 diabetes mellitus with diabetic chronic kidney disease: Secondary | ICD-10-CM | POA: Diagnosis not present

## 2015-04-05 DIAGNOSIS — E119 Type 2 diabetes mellitus without complications: Secondary | ICD-10-CM | POA: Diagnosis not present

## 2015-04-05 DIAGNOSIS — Z0000 Encounter for general adult medical examination without abnormal findings: Secondary | ICD-10-CM | POA: Diagnosis not present

## 2015-04-07 DIAGNOSIS — E785 Hyperlipidemia, unspecified: Secondary | ICD-10-CM | POA: Diagnosis not present

## 2015-04-07 DIAGNOSIS — E119 Type 2 diabetes mellitus without complications: Secondary | ICD-10-CM | POA: Diagnosis not present

## 2015-04-12 DIAGNOSIS — E1122 Type 2 diabetes mellitus with diabetic chronic kidney disease: Secondary | ICD-10-CM | POA: Diagnosis not present

## 2015-04-12 DIAGNOSIS — E785 Hyperlipidemia, unspecified: Secondary | ICD-10-CM | POA: Diagnosis not present

## 2015-04-12 DIAGNOSIS — E039 Hypothyroidism, unspecified: Secondary | ICD-10-CM | POA: Diagnosis not present

## 2015-04-12 DIAGNOSIS — E213 Hyperparathyroidism, unspecified: Secondary | ICD-10-CM | POA: Diagnosis not present

## 2015-04-13 DIAGNOSIS — L400 Psoriasis vulgaris: Secondary | ICD-10-CM | POA: Diagnosis not present

## 2015-05-19 DIAGNOSIS — F3174 Bipolar disorder, in full remission, most recent episode manic: Secondary | ICD-10-CM | POA: Diagnosis not present

## 2015-06-24 ENCOUNTER — Other Ambulatory Visit: Payer: Self-pay

## 2015-06-24 DIAGNOSIS — Z1231 Encounter for screening mammogram for malignant neoplasm of breast: Secondary | ICD-10-CM

## 2015-07-06 DIAGNOSIS — E1122 Type 2 diabetes mellitus with diabetic chronic kidney disease: Secondary | ICD-10-CM | POA: Diagnosis not present

## 2015-07-06 DIAGNOSIS — E785 Hyperlipidemia, unspecified: Secondary | ICD-10-CM | POA: Diagnosis not present

## 2015-07-06 DIAGNOSIS — E559 Vitamin D deficiency, unspecified: Secondary | ICD-10-CM | POA: Diagnosis not present

## 2015-07-06 DIAGNOSIS — M810 Age-related osteoporosis without current pathological fracture: Secondary | ICD-10-CM | POA: Diagnosis not present

## 2015-07-13 DIAGNOSIS — E039 Hypothyroidism, unspecified: Secondary | ICD-10-CM | POA: Diagnosis not present

## 2015-07-13 DIAGNOSIS — E1122 Type 2 diabetes mellitus with diabetic chronic kidney disease: Secondary | ICD-10-CM | POA: Diagnosis not present

## 2015-07-13 DIAGNOSIS — E785 Hyperlipidemia, unspecified: Secondary | ICD-10-CM | POA: Diagnosis not present

## 2015-07-13 DIAGNOSIS — M810 Age-related osteoporosis without current pathological fracture: Secondary | ICD-10-CM | POA: Diagnosis not present

## 2015-08-03 ENCOUNTER — Ambulatory Visit
Admission: RE | Admit: 2015-08-03 | Discharge: 2015-08-03 | Disposition: A | Discharge status: Home / Self Care | Payer: Medicare Other | Source: Ambulatory Visit

## 2015-08-03 DIAGNOSIS — Z1231 Encounter for screening mammogram for malignant neoplasm of breast: Secondary | ICD-10-CM | POA: Diagnosis not present

## 2015-08-09 DIAGNOSIS — F3174 Bipolar disorder, in full remission, most recent episode manic: Secondary | ICD-10-CM | POA: Diagnosis not present

## 2015-08-23 ENCOUNTER — Encounter: Payer: Self-pay | Admitting: Gynecology

## 2015-08-23 ENCOUNTER — Ambulatory Visit (INDEPENDENT_AMBULATORY_CARE_PROVIDER_SITE_OTHER): Payer: Medicare Other | Admitting: Gynecology

## 2015-08-23 VITALS — BP 116/72 | Ht 62.0 in | Wt 134.6 lb

## 2015-08-23 DIAGNOSIS — Z01419 Encounter for gynecological examination (general) (routine) without abnormal findings: Secondary | ICD-10-CM | POA: Diagnosis not present

## 2015-08-23 DIAGNOSIS — N951 Menopausal and female climacteric states: Secondary | ICD-10-CM

## 2015-08-23 DIAGNOSIS — M810 Age-related osteoporosis without current pathological fracture: Secondary | ICD-10-CM

## 2015-08-23 DIAGNOSIS — R232 Flushing: Secondary | ICD-10-CM

## 2015-08-23 MED ORDER — ESTRADIOL 0.0375 MG/24HR TD PTTW: 1.0000 | MEDICATED_PATCH | TRANSDERMAL | Status: DC

## 2015-08-23 NOTE — Patient Instructions (Signed)
Estradiol skin patches What is this medicine? ESTRADIOL (es tra DYE ole) skin patches contain an estrogen. It is mostly used as hormone replacement in menopausal women. It helps to treat hot flashes and prevent osteoporosis. It is also used to treat women with low estrogen levels or those who have had their ovaries removed. This medicine may be used for other purposes; ask your health care provider or pharmacist if you have questions. What should I tell my health care provider before I take this medicine? They need to know if you have any of these conditions: -abnormal vaginal bleeding -blood vessel disease or blood clots -breast, cervical, endometrial, ovarian, liver, or uterine cancer -dementia -diabetes -gallbladder disease -heart disease or recent heart attack -high blood pressure -high cholesterol -high level of calcium in the blood -hysterectomy -kidney disease -liver disease -migraine headaches -protein C deficiency -protein S deficiency -stroke -systemic lupus erythematosus (SLE) -tobacco smoker -an unusual or allergic reaction to estrogens, other hormones, medicines, foods, dyes, or preservatives -pregnant or trying to get pregnant -breast-feeding How should I use this medicine? This medicine is for external use only. Follow the directions on the prescription label. Tear open the pouch, do not use scissors. Remove the stiff protective liner covering the adhesive. Try not to touch the adhesive. Apply the patch, sticky side to the skin, to an area that is clean, dry and hairless. Avoid injured, irritated, calloused, or scarred areas. Do not apply the skin patches to your breasts or around the waistline. Use a different site each time to prevent skin irritation. Do not cut or trim the patch. Do not stop using except on the advice of your doctor or health care professional. Do not wear more than one patch at a time unless you are told to do so by your doctor or health care  professional. Contact your pediatrician regarding the use of this medicine in children. Special care may be needed. A patient package insert for the product will be given with each prescription and refill. Read this sheet carefully each time. The sheet may change frequently. Overdosage: If you think you have taken too much of this medicine contact a poison control center or emergency room at once. NOTE: This medicine is only for you. Do not share this medicine with others. What if I miss a dose? If you miss a dose, apply it as soon as you can. If it is almost time for your next dose, apply only that dose. Do not apply double or extra doses. What may interact with this medicine? Do not take this medicine with any of the following medications: -aromatase inhibitors like aminoglutethimide, anastrozole, exemestane, letrozole, testolactone This medicine may also interact with the following medications: -carbamazepine -certain antibiotics used to treat infections -certain barbiturates used for inducing sleep or treating seizures -grapefruit juice -medicines for fungus infections like itraconazole and ketoconazole -raloxifene or tamoxifen -rifabutin, rifampin, or rifapentine -ritonavir -St. John's Wort This list may not describe all possible interactions. Give your health care provider a list of all the medicines, herbs, non-prescription drugs, or dietary supplements you use. Also tell them if you smoke, drink alcohol, or use illegal drugs. Some items may interact with your medicine. What should I watch for while using this medicine? Visit your doctor or health care professional for regular checks on your progress. You will need a regular breast and pelvic exam and Pap smear while on this medicine. You should also discuss the need for regular mammograms with your health care professional, and  follow his or her guidelines for these tests. This medicine can make your body retain fluid, making your  fingers, hands, or ankles swell. Your blood pressure can go up. Contact your doctor or health care professional if you feel you are retaining fluid. If you have any reason to think you are pregnant, stop taking this medicine right away and contact your doctor or health care professional. Smoking increases the risk of getting a blood clot or having a stroke while you are taking this medicine, especially if you are more than 68 years old. You are strongly advised not to smoke. If you wear contact lenses and notice visual changes, or if the lenses begin to feel uncomfortable, consult your eye doctor or health care professional. This medicine can increase the risk of developing a condition (endometrial hyperplasia) that may lead to cancer of the lining of the uterus. Taking progestins, another hormone drug, with this medicine lowers the risk of developing this condition. Therefore, if your uterus has not been removed (by a hysterectomy), your doctor may prescribe a progestin for you to take together with your estrogen. You should know, however, that taking estrogens with progestins may have additional health risks. You should discuss the use of estrogens and progestins with your health care professional to determine the benefits and risks for you. If you are going to have surgery or an MRI, you may need to stop taking this medicine. Consult your health care professional for advice before you schedule the surgery. You may bathe or participate in other activities while wearing your patch. If the patch pulls loose or falls off, you may reapply it if the patch is sticky enough to stay on the skin. You should reapply the patch in a different area. Use a fresh patch if it will no longer stick. What side effects may I notice from receiving this medicine? Side effects that you should report to your doctor or health care professional as soon as possible: -allergic reactions like skin rash, itching or hives, swelling of  the face, lips, or tongue -breast tissue changes or discharge -changes in vision -chest pain -confusion, trouble speaking or understanding -dark urine -general ill feeling or flu-like symptoms -light-colored stools -nausea, vomiting -pain, swelling, warmth in the leg -right upper belly pain -severe headaches -shortness of breath -sudden numbness or weakness of the face, arm or leg -trouble walking, dizziness, loss of balance or coordination -unusual vaginal bleeding -yellowing of the eyes or skin Side effects that usually do not require medical attention (report to your doctor or health care professional if they continue or are bothersome): -hair loss -increased hunger or thirst -increased urination -symptoms of vaginal infection like itching, irritation or unusual discharge -unusually weak or tired This list may not describe all possible side effects. Call your doctor for medical advice about side effects. You may report side effects to FDA at 1-800-FDA-1088. Where should I keep my medicine? Keep out of the reach of children. Store at room temperature below 30 degrees C (86 degrees F). Do not store any patches that have been removed from their protective pouch. Throw away any unused medicine after the expiration date. Dispose of used patches properly. Since used patches may still contain active hormones, fold the patch in half so that it sticks to itself prior to disposal. NOTE: This sheet is a summary. It may not cover all possible information. If you have questions about this medicine, talk to your doctor, pharmacist, or health care provider.  2016, [employer]. (2010-05-03 09:19:41)

## 2015-08-23 NOTE — Progress Notes (Signed)
[name] 20-Jan-1946 [id number]   History:    70 y.o.  for annual gyn exam who is a new patient to the practice. She is currently being followed by Dr. Noah Delaine who is currently treating her for osteoporosis for which she is currently on alendronate 70 mg every weekly. Her last bone density study was 3 years ago for which we have no report. She reports normal colonoscopy in 2014 and she is on a 10 year recall. Patient several years ago had a transvaginal hysterectomy as a result of dysfunction uterine bleeding and in 2005 Dr. Karsten Ro did a sling procedure with a mesh. Patient has been complaining of worsening hot flashes for the past year. She came off of hormone replacement therapy approximately 2 years. She had been on it for over 10 years. She states she has 5 or 6 episodes a day and is not sleep and 9 and it wakes her up. Patient states that prior to her hysterectomy she never had history of abnormal Pap smears.  Past medical history,surgical history, family history and social history were all reviewed and documented in the EPIC chart.  Gynecologic History No LMP recorded. Patient has had a hysterectomy. Contraception: status post hysterectomy Last Pap: Several years ago. Results were: normal Last mammogram: 2017. Results were: normal  Obstetric History OB History  Gravida Para Term Preterm AB SAB TAB Ectopic Multiple Living  1 1        1     # Outcome Date GA Lbr Len/2nd Weight Sex Delivery Anes PTL Lv  1 Para                ROS: A ROS was performed and pertinent positives and negatives are included in the history.  GENERAL: No fevers or chills. HEENT: No change in vision, no earache, sore throat or sinus congestion. NECK: No pain or stiffness. CARDIOVASCULAR: No chest pain or pressure. No palpitations. PULMONARY: No shortness of breath, cough or wheeze. GASTROINTESTINAL: No abdominal pain, nausea, vomiting or diarrhea, melena or bright red blood per rectum. GENITOURINARY: No  urinary frequency, urgency, hesitancy or dysuria. MUSCULOSKELETAL: No joint or muscle pain, no back pain, no recent trauma. DERMATOLOGIC: No rash, no itching, no lesions. ENDOCRINE: No polyuria, polydipsia, no heat or cold intolerance. No recent change in weight. HEMATOLOGICAL: No anemia or easy bruising or bleeding. NEUROLOGIC: No headache, seizures, numbness, tingling or weakness. PSYCHIATRIC: No depression, no loss of interest in normal activity or change in sleep pattern.     Exam: chaperone present  BP 116/72 mmHg  Ht 5\' 2"  (1.575 m)  Wt 134 lb 9.6 oz (61.054 kg)  BMI 24.61 kg/m2  Body mass index is 24.61 kg/(m^2).  General appearance : Well developed well nourished female. No acute distress HEENT: Eyes: no retinal hemorrhage or exudates,  Neck supple, trachea midline, no carotid bruits, no thyroidmegaly Lungs: Clear to auscultation, no rhonchi or wheezes, or rib retractions  Heart: Regular rate and rhythm, no murmurs or gallops Breast:Examined in sitting and supine position were symmetrical in appearance, no palpable masses or tenderness,  no skin retraction, no nipple inversion, no nipple discharge, no skin discoloration, no axillary or supraclavicular lymphadenopathy Abdomen: no palpable masses or tenderness, no rebound or guarding Extremities: no edema or skin discoloration or tenderness  Pelvic:  Bartholin, Urethra, Skene Glands: Within normal limits             Vagina: No gross lesions or discharge  Cervix: Absent  Uterus absent  Adnexa  Without masses or tenderness  Anus and perineum  normal   Rectovaginal  normal sphincter tone without palpated masses or tenderness             Hemoccult PCP provides     Assessment/Plan:  70 y.o. female for annual exam with worsening menopausal symptoms of hot flashes 6 episodes per day, insomnia waking up at night sweating. We had a lengthy discussion on the women's health initiative study. We discussed the risks benefits and pros and  cons of hormone replacement therapy. We could start her back on estrogen but a very low dose transdermal patch twice a week of many Vivelle 0.0375 mg. Patient states that she'll except the risk she cannot live with this type of quality of life issues externally so many hot flashes. She is not sexually active. I've also asked that she continues as well peppermint oil that she can apply transdermally when necessary. We discussed importance of calcium vitamin D and weightbearing exercises. Since we have no record of prior Pap smear will do one today and then she will no longer need Pap smear to adhere to the new guidelines. Her PCP is been doing her blood work.   Terrance Mass MD, 10:12 AM 08/23/2015

## 2015-08-24 LAB — PAP IG W/ RFLX HPV ASCU

## 2015-10-11 ENCOUNTER — Other Ambulatory Visit: Payer: Self-pay

## 2015-10-14 DIAGNOSIS — E039 Hypothyroidism, unspecified: Secondary | ICD-10-CM | POA: Diagnosis not present

## 2015-11-02 DIAGNOSIS — F3174 Bipolar disorder, in full remission, most recent episode manic: Secondary | ICD-10-CM | POA: Diagnosis not present

## 2015-11-04 DIAGNOSIS — D1801 Hemangioma of skin and subcutaneous tissue: Secondary | ICD-10-CM | POA: Diagnosis not present

## 2015-11-08 DIAGNOSIS — F3174 Bipolar disorder, in full remission, most recent episode manic: Secondary | ICD-10-CM | POA: Diagnosis not present

## 2015-11-14 ENCOUNTER — Other Ambulatory Visit: Payer: Self-pay | Admitting: Internal Medicine

## 2015-11-14 DIAGNOSIS — R5383 Other fatigue: Secondary | ICD-10-CM | POA: Diagnosis not present

## 2015-11-14 DIAGNOSIS — R1031 Right lower quadrant pain: Secondary | ICD-10-CM

## 2015-11-14 DIAGNOSIS — R1030 Lower abdominal pain, unspecified: Secondary | ICD-10-CM | POA: Diagnosis not present

## 2015-11-23 ENCOUNTER — Ambulatory Visit
Admission: RE | Admit: 2015-11-23 | Discharge: 2015-11-23 | Disposition: A | Discharge status: Home / Self Care | Payer: Medicare Other | Source: Ambulatory Visit | Attending: Internal Medicine | Admitting: Internal Medicine

## 2015-11-23 DIAGNOSIS — R1031 Right lower quadrant pain: Secondary | ICD-10-CM

## 2015-11-23 DIAGNOSIS — R1030 Lower abdominal pain, unspecified: Secondary | ICD-10-CM | POA: Diagnosis not present

## 2015-12-06 ENCOUNTER — Other Ambulatory Visit: Payer: Self-pay | Admitting: Urology

## 2015-12-06 DIAGNOSIS — R102 Pelvic and perineal pain: Secondary | ICD-10-CM | POA: Diagnosis not present

## 2015-12-06 DIAGNOSIS — T83712A Erosion of implanted urethral mesh to surrounding organ or tissue, initial encounter: Secondary | ICD-10-CM | POA: Diagnosis not present

## 2015-12-14 ENCOUNTER — Encounter (HOSPITAL_BASED_OUTPATIENT_CLINIC_OR_DEPARTMENT_OTHER): Payer: Self-pay | Admitting: *Deleted

## 2015-12-14 NOTE — Progress Notes (Signed)
NPO AFTER MN.  ARRIVE AT 0800.  NEEDS HG.  WILL TAKE SYNTHROID AM DOS W/ SIPS OF WATER.

## 2015-12-18 NOTE — H&P (Signed)
CC/HPI: She came in to see me today because she unfortunately has been influenced by the ads by lawyers on TV for complications with vaginal mesh. She asked if I could remove her mesh. She wanted to know if there was a way I can tell if she had erosion. She reports that she has been experiencing sweats but denies any significant vaginal discharge. She has not had difficulty with recurrent infections. Occasionally she will have some urgency and also reports some pressure in the area of the urethra that seems to be a little bit more lateralized to the right hand side.     ALLERGIES: Aspirin Sulfa    MEDICATIONS: Levothyroxine Sodium 88 mcg tablet  Alendronate Sodium 70 mg tablet  Divalproex Sodium 250 mg tablet, delayed release  Lantus Solostar 100 unit/ml (3 ml) insulin pen  Temazepam 15 mg capsule  Tylenol     GU PSH: Hysterectomy    NON-GU PSH: Bilateral Tubal Ligation Partial Remove Thyroid, Left    GU PMH: Chronic Kidney Disease      PMH Notes: SUI: treated with a trans obturator sling in 7/06 with good result.   NON-GU PMH: Diabetes Type 2    FAMILY HISTORY: 1 son   SOCIAL HISTORY: Marital Status: Married Current Smoking Status: Patient smokes.  Has never drank.  Does not use drugs. Drinks 2 caffeinated drinks per day.    REVIEW OF SYSTEMS:    GU Review Female:   Patient reports get up at night to urinate, hard to postpone urination, frequent urination, and leakage of urine. Patient denies currently pregnant, stream starts and stops, have to strain to urinate, trouble starting your stream, and burning /pain with urination.  Gastrointestinal (Upper):   Patient denies nausea, vomiting, and indigestion/ heartburn.  Gastrointestinal (Lower):   Patient reports constipation. Patient denies diarrhea.  Constitutional:   Patient reports night sweats and fatigue. Patient denies fever and weight loss.  Skin:   Patient reports skin rash/ lesion and itching.   Eyes:   Patient  reports blurred vision. Patient denies double vision.  Ears/ Nose/ Throat:   Patient denies sore throat and sinus problems.  Hematologic/Lymphatic:   Patient reports easy bruising. Patient denies swollen glands.  Cardiovascular:   Patient reports leg swelling. Patient denies chest pains.  Respiratory:   Patient reports shortness of breath. Patient denies cough.  Endocrine:   Patient reports excessive thirst.   Musculoskeletal:   Patient reports back pain and joint pain.   Neurological:   Patient denies headaches and dizziness.  Psychologic:   Patient reports depression. Patient denies anxiety.   VITAL SIGNS:    Weight 135 lb / 61.23 kg  Height 62 in / 157.48 cm  BP 114/65 mmHg  Pulse 66 /min  BMI 24.7 kg/m   GU PHYSICAL EXAMINATION:    External Genitalia: No hirsutism, no rash, no scarring, no cyst, no erythematous lesion, no papular lesion, no blanched lesion, no warty lesion. No edema.  Urethral Meatus: Normal size. Normal position. No discharge.  Urethra: She did have some tenderness to the right of urethra which seems to be associated with a band that I could not definitely discern as an erosion. It may be the sling is present underneath the vaginal mucosa but has not caused an erosion although I was unable to visualize an erosion today in the office No urethral hypermobility. No leakage.   Bladder: Normal to palpation, no tenderness, no mass, normal size. There is good support of the bladder noted.  Vagina: No atrophy, no stenosis. No rectocele. No cystocele. No enterocele.   MULTI-SYSTEM PHYSICAL EXAMINATION:    Constitutional: Well-nourished. No physical deformities. Normally developed. Good grooming.  Neck: Neck symmetrical, not swollen. Normal tracheal position.  Respiratory: No labored breathing, no use of accessory muscles.   Cardiovascular: Normal temperature, normal extremity pulses, no swelling, no varicosities.  Lymphatic: No enlargement of neck, axillae, groin.  Skin:  No paleness, no jaundice, no cyanosis. No lesion, no ulcer, no rash.  Neurologic / Psychiatric: Oriented to time, oriented to place, oriented to person. No depression, no anxiety, no agitation.  Gastrointestinal: No mass, no tenderness, no rigidity, non obese abdomen.  Eyes: Normal conjunctivae. Normal eyelids.  Ears, Nose, Mouth, and Throat: Left ear no scars, no lesions, no masses. Right ear no scars, no lesions, no masses. Nose no scars, no lesions, no masses. Normal hearing. Normal lips.  Musculoskeletal: Normal gait and station of head and neck.     PAST DATA REVIEWED:  Source Of History:  Patient  Lab Test Review:   BUN/Creatinine  Records Review:   Previous Patient Records, POC Tool  X-Ray Review: Pelvic Ultrasound: Reviewed Report. A pelvic ultrasound on 11/23/15 revealed no abnormality specifically nothing found in the area of the right groin.   Notes:                     I note her creatinine in 10/17 was 1.2.         Urinalysis w/Scope - 81001 Dipstick Dipstick Cont'd Micro  Specimen: Voided Bilirubin: Neg WBC/hpf: 20 - 40/hpf  Color: Yellow Ketones: Neg RBC/hpf: 0 - 2/hpf  Appearance: Clear Blood: Neg Bacteria: Few (10-25/hpf)  Specific Gravity: 1.015 Protein: Neg Cystals: NS (Not Seen)  pH: 6.0 Urobilinogen: 0.2 Casts: NS (Not Seen)  Glucose: Neg Nitrites: Neg Trichomonas: Not Present    Leukocyte Esterase: 2+ Mucous: Not Present      Epithelial Cells: 0 - 5/hpf      Yeast: NS (Not Seen)      Sperm: Not Present    ASSESSMENT:      ICD-10 Details  1 GU:   Erosion of implanted urethral mesh to surrounding organ or tissue, initial encounter - T83.712A We discussed the fact that although I do not appreciate an actual erosion there may be thinning of the vaginal mucosa over the sling material. Because she is having pain in this location we discussed the options and she would like to proceed with surgical explantation. I went over the procedure with her in detail including the  incision used, the fact that I would only remove a portion of the sling, the risk of renal developing stress incontinence as well as the outpatient nature of the procedure and the anticipated postoperative course. She understands and is elected to proceed.  2   Pelvic and perineal pain - R10.2 it appears her pain may be in fact related to her sling material and therefore I will evaluate her under anesthesia and likely proceed with removal of a portion of her sling as well as evaluate urethra cystoscopically.   PLAN: Examination under anesthesia with cystoscopy and possible removal of any exposed mesh.

## 2015-12-19 ENCOUNTER — Encounter (HOSPITAL_BASED_OUTPATIENT_CLINIC_OR_DEPARTMENT_OTHER): Payer: Self-pay | Admitting: *Deleted

## 2015-12-19 ENCOUNTER — Ambulatory Visit (HOSPITAL_COMMUNITY)
Admission: RE | Admit: 2015-12-19 | Discharge: 2015-12-19 | Disposition: A | Discharge status: Home / Self Care | Payer: Medicare Other | Source: Ambulatory Visit | Attending: Urology | Admitting: Urology

## 2015-12-19 ENCOUNTER — Ambulatory Visit (HOSPITAL_BASED_OUTPATIENT_CLINIC_OR_DEPARTMENT_OTHER): Payer: Medicare Other | Admitting: Certified Registered"

## 2015-12-19 ENCOUNTER — Encounter (HOSPITAL_BASED_OUTPATIENT_CLINIC_OR_DEPARTMENT_OTHER)
Admission: RE | Disposition: A | Discharge status: Home / Self Care | Payer: Self-pay | Source: Ambulatory Visit | Attending: Urology

## 2015-12-19 DIAGNOSIS — Z794 Long term (current) use of insulin: Secondary | ICD-10-CM | POA: Diagnosis not present

## 2015-12-19 DIAGNOSIS — Z960 Presence of urogenital implants: Secondary | ICD-10-CM | POA: Insufficient documentation

## 2015-12-19 DIAGNOSIS — N189 Chronic kidney disease, unspecified: Secondary | ICD-10-CM | POA: Insufficient documentation

## 2015-12-19 DIAGNOSIS — Z7983 Long term (current) use of bisphosphonates: Secondary | ICD-10-CM | POA: Insufficient documentation

## 2015-12-19 DIAGNOSIS — R102 Pelvic and perineal pain: Secondary | ICD-10-CM | POA: Diagnosis not present

## 2015-12-19 DIAGNOSIS — Z711 Person with feared health complaint in whom no diagnosis is made: Secondary | ICD-10-CM | POA: Diagnosis not present

## 2015-12-19 DIAGNOSIS — Z79899 Other long term (current) drug therapy: Secondary | ICD-10-CM | POA: Insufficient documentation

## 2015-12-19 DIAGNOSIS — I509 Heart failure, unspecified: Secondary | ICD-10-CM | POA: Diagnosis not present

## 2015-12-19 DIAGNOSIS — R1031 Right lower quadrant pain: Secondary | ICD-10-CM

## 2015-12-19 DIAGNOSIS — E1122 Type 2 diabetes mellitus with diabetic chronic kidney disease: Secondary | ICD-10-CM | POA: Insufficient documentation

## 2015-12-19 DIAGNOSIS — E119 Type 2 diabetes mellitus without complications: Secondary | ICD-10-CM | POA: Diagnosis not present

## 2015-12-19 DIAGNOSIS — T83712A Erosion of implanted urethral mesh to surrounding organ or tissue, initial encounter: Secondary | ICD-10-CM | POA: Diagnosis not present

## 2015-12-19 DIAGNOSIS — F329 Major depressive disorder, single episode, unspecified: Secondary | ICD-10-CM | POA: Diagnosis not present

## 2015-12-19 DIAGNOSIS — R1030 Lower abdominal pain, unspecified: Secondary | ICD-10-CM | POA: Diagnosis not present

## 2015-12-19 HISTORY — DX: Personal history of other endocrine, nutritional and metabolic disease: Z86.39

## 2015-12-19 HISTORY — DX: Personal history of colonic polyps: Z86.010

## 2015-12-19 HISTORY — PX: CYSTOSCOPY: SHX5120

## 2015-12-19 HISTORY — DX: Postprocedural hypothyroidism: E89.0

## 2015-12-19 HISTORY — DX: Bipolar disorder, unspecified: F31.9

## 2015-12-19 HISTORY — DX: Frequency of micturition: R35.0

## 2015-12-19 HISTORY — DX: Personal history of colon polyps, unspecified: Z86.0100

## 2015-12-19 HISTORY — DX: Type 2 diabetes mellitus without complications: E11.9

## 2015-12-19 HISTORY — DX: Personal history of traumatic brain injury: Z87.820

## 2015-12-19 HISTORY — DX: Chills (without fever): R68.83

## 2015-12-19 LAB — GLUCOSE, CAPILLARY
Glucose-Capillary: 109 mg/dL — ABNORMAL HIGH (ref 65–99)
Glucose-Capillary: 121 mg/dL — ABNORMAL HIGH (ref 65–99)

## 2015-12-19 LAB — POCT HEMOGLOBIN-HEMACUE: Hemoglobin: 11.7 g/dL — ABNORMAL LOW (ref 12.0–15.0)

## 2015-12-19 SURGERY — CYSTOSCOPY
Anesthesia: General

## 2015-12-19 MED ORDER — ONDANSETRON HCL 4 MG/2ML IJ SOLN
INTRAMUSCULAR | Status: DC | PRN
  Administered 2015-12-19: 4 mg via INTRAVENOUS

## 2015-12-19 MED ORDER — DEXAMETHASONE SODIUM PHOSPHATE 4 MG/ML IJ SOLN
INTRAMUSCULAR | Status: DC | PRN
  Administered 2015-12-19: 10 mg via INTRAVENOUS

## 2015-12-19 MED ORDER — OXYCODONE HCL 5 MG/5ML PO SOLN
5.0000 mg | Freq: Once | ORAL | Status: DC | PRN
  Filled 2015-12-19: qty 5

## 2015-12-19 MED ORDER — MELOXICAM 15 MG PO TABS: 15.0000 mg | ORAL_TABLET | Freq: Every day | ORAL | 0 refills | Status: DC

## 2015-12-19 MED ORDER — BUPIVACAINE-EPINEPHRINE (PF) 0.5% -1:200000 IJ SOLN
INTRAMUSCULAR | Status: AC
  Filled 2015-12-19: qty 30

## 2015-12-19 MED ORDER — FENTANYL CITRATE (PF) 100 MCG/2ML IJ SOLN
25.0000 ug | INTRAMUSCULAR | Status: DC | PRN
  Filled 2015-12-19: qty 1

## 2015-12-19 MED ORDER — LIDOCAINE 2% (20 MG/ML) 5 ML SYRINGE
INTRAMUSCULAR | Status: DC | PRN
  Administered 2015-12-19: 60 mg via INTRAVENOUS

## 2015-12-19 MED ORDER — DEXAMETHASONE SODIUM PHOSPHATE 10 MG/ML IJ SOLN
INTRAMUSCULAR | Status: AC
Start: 2015-12-19 — End: 2015-12-19
  Filled 2015-12-19: qty 1

## 2015-12-19 MED ORDER — FENTANYL CITRATE (PF) 100 MCG/2ML IJ SOLN
INTRAMUSCULAR | Status: AC
  Filled 2015-12-19: qty 2

## 2015-12-19 MED ORDER — OXYCODONE HCL 5 MG PO TABS
5.0000 mg | ORAL_TABLET | Freq: Once | ORAL | Status: DC | PRN
  Filled 2015-12-19: qty 1

## 2015-12-19 MED ORDER — CEFAZOLIN SODIUM-DEXTROSE 2-4 GM/100ML-% IV SOLN
INTRAVENOUS | Status: AC
  Filled 2015-12-19: qty 100

## 2015-12-19 MED ORDER — LACTATED RINGERS IV SOLN
INTRAVENOUS | Status: DC
  Administered 2015-12-19: 09:00:00 via INTRAVENOUS
  Filled 2015-12-19: qty 1000

## 2015-12-19 MED ORDER — MIDAZOLAM HCL 5 MG/5ML IJ SOLN
INTRAMUSCULAR | Status: DC | PRN
  Administered 2015-12-19: 2 mg via INTRAVENOUS

## 2015-12-19 MED ORDER — ONDANSETRON HCL 4 MG/2ML IJ SOLN
INTRAMUSCULAR | Status: AC
  Filled 2015-12-19: qty 2

## 2015-12-19 MED ORDER — PROPOFOL 10 MG/ML IV BOLUS
INTRAVENOUS | Status: AC
Start: 2015-12-19 — End: 2015-12-19
  Filled 2015-12-19: qty 20

## 2015-12-19 MED ORDER — FENTANYL CITRATE (PF) 100 MCG/2ML IJ SOLN
INTRAMUSCULAR | Status: DC | PRN
  Administered 2015-12-19: 50 ug via INTRAVENOUS

## 2015-12-19 MED ORDER — LIDOCAINE 2% (20 MG/ML) 5 ML SYRINGE
INTRAMUSCULAR | Status: AC
  Filled 2015-12-19: qty 5

## 2015-12-19 MED ORDER — STERILE WATER FOR IRRIGATION IR SOLN
Status: DC | PRN
  Administered 2015-12-19: 3000 mL

## 2015-12-19 MED ORDER — PROPOFOL 10 MG/ML IV BOLUS
INTRAVENOUS | Status: DC | PRN
  Administered 2015-12-19: 180 mg via INTRAVENOUS

## 2015-12-19 MED ORDER — CEFAZOLIN SODIUM-DEXTROSE 2-4 GM/100ML-% IV SOLN
2.0000 g | INTRAVENOUS | Status: AC
  Administered 2015-12-19: 2 g via INTRAVENOUS
  Filled 2015-12-19: qty 100

## 2015-12-19 MED ORDER — MIDAZOLAM HCL 2 MG/2ML IJ SOLN
INTRAMUSCULAR | Status: AC
  Filled 2015-12-19: qty 2

## 2015-12-19 MED FILL — MELOXICAM 15 MG TABLET: 15 | 30 days supply | Qty: 30 | Fill #0

## 2015-12-19 SURGICAL SUPPLY — 52 items
BAG DRAIN URO-CYSTO SKYTR STRL (DRAIN) ×2 IMPLANT
BAG DRN UROCATH (DRAIN) ×1
BAG URINE DRAINAGE (UROLOGICAL SUPPLIES) ×1 IMPLANT
BLADE SURG 15 STRL LF DISP TIS (BLADE) ×1 IMPLANT
BLADE SURG 15 STRL SS (BLADE) ×2
CATH FOLEY 2WAY SLVR  5CC 16FR (CATHETERS) ×1
CATH FOLEY 2WAY SLVR 5CC 16FR (CATHETERS) ×1 IMPLANT
CLOTH BEACON ORANGE TIMEOUT ST (SAFETY) ×2 IMPLANT
COVER MAYO STAND STRL (DRAPES) ×2 IMPLANT
DRAPE UNDERBUTTOCKS STRL (DRAPE) ×2 IMPLANT
ELECT REM PT RETURN 9FT ADLT (ELECTROSURGICAL) ×2
ELECTRODE REM PT RTRN 9FT ADLT (ELECTROSURGICAL) ×1 IMPLANT
GAUZE PACKING IODOFORM 2 (PACKING) IMPLANT
GLOVE BIO SURGEON STRL SZ8 (GLOVE) ×2 IMPLANT
GLOVE SURG SS PI 7.0 STRL IVOR (GLOVE) ×2 IMPLANT
GOWN STRL REUS W/ TWL LRG LVL3 (GOWN DISPOSABLE) ×1 IMPLANT
GOWN STRL REUS W/ TWL XL LVL3 (GOWN DISPOSABLE) ×1 IMPLANT
GOWN STRL REUS W/TWL LRG LVL3 (GOWN DISPOSABLE) ×2
GOWN STRL REUS W/TWL XL LVL3 (GOWN DISPOSABLE) ×2
GUIDEWIRE 0.038 PTFE COATED (WIRE) ×1 IMPLANT
GUIDEWIRE ANG ZIPWIRE 038X150 (WIRE) IMPLANT
GUIDEWIRE STR DUAL SENSOR (WIRE) IMPLANT
IV NS IRRIG 3000ML ARTHROMATIC (IV SOLUTION) IMPLANT
KIT ROOM TURNOVER WOR (KITS) ×2 IMPLANT
LIQUID BAND (GAUZE/BANDAGES/DRESSINGS) ×2 IMPLANT
MANIFOLD NEPTUNE II (INSTRUMENTS) IMPLANT
NEEDLE HYPO 22GX1.5 SAFETY (NEEDLE) ×2 IMPLANT
NS IRRIG 500ML POUR BTL (IV SOLUTION) IMPLANT
PACK BASIN DAY SURGERY FS (CUSTOM PROCEDURE TRAY) ×2 IMPLANT
PACK CYSTO (CUSTOM PROCEDURE TRAY) ×2 IMPLANT
PENCIL BUTTON HOLSTER BLD 10FT (ELECTRODE) ×2 IMPLANT
PLUG CATH AND CAP STER (CATHETERS) ×2 IMPLANT
RETRACTOR LONRSTAR 16.6X16.6CM (MISCELLANEOUS) IMPLANT
RETRACTOR STAY HOOK 5MM (MISCELLANEOUS) IMPLANT
RETRACTOR STER APS 16.6X16.6CM (MISCELLANEOUS)
SET IRRIG Y TYPE TUR BLADDER L (SET/KITS/TRAYS/PACK) ×2 IMPLANT
SHEET LAVH (DRAPES) ×1 IMPLANT
SUCTION FRAZIER HANDLE 10FR (MISCELLANEOUS) ×1
SUCTION TUBE FRAZIER 10FR DISP (MISCELLANEOUS) ×1 IMPLANT
SUT ETHIBOND 0 (SUTURE) IMPLANT
SUT SILK 2 0 30  PSL (SUTURE)
SUT SILK 2 0 30 PSL (SUTURE) IMPLANT
SUT VIC AB 2-0 UR6 27 (SUTURE) ×4 IMPLANT
SYR BULB IRRIGATION 50ML (SYRINGE) ×2 IMPLANT
SYR CONTROL 10ML LL (SYRINGE) ×2 IMPLANT
SYRINGE 10CC LL (SYRINGE) ×2 IMPLANT
SYRINGE IRR TOOMEY STRL 70CC (SYRINGE) IMPLANT
TRAY DSU PREP LF (CUSTOM PROCEDURE TRAY) ×2 IMPLANT
TUBE CONNECTING 12X1/4 (SUCTIONS) ×4 IMPLANT
WATER STERILE IRR 3000ML UROMA (IV SOLUTION) ×2 IMPLANT
WATER STERILE IRR 500ML POUR (IV SOLUTION) ×1 IMPLANT
YANKAUER SUCT BULB TIP NO VENT (SUCTIONS) IMPLANT

## 2015-12-19 NOTE — Transfer of Care (Signed)
Immediate Anesthesia Transfer of Care Note  Patient: [name]  Procedure(s) Performed: Procedure(s) (LRB): CYSTOSCOPY, EXAM UNDER ANESTHESIA (N/A)  Patient Location: PACU  Anesthesia Type: General  Level of Consciousness: awake, oriented, sedated and patient cooperative  Airway & Oxygen Therapy: Patient Spontanous Breathing and Patient connected to face mask oxygen  Post-op Assessment: Report given to PACU RN and Post -op Vital signs reviewed and stable  Post vital signs: Reviewed and stable  Complications: No apparent anesthesia complications  Last Vitals:  Vitals:   12/19/15 0807 12/19/15 1015  BP: (!) 114/38 (!) (P) 105/50  Pulse: 61 (P) 64  Resp: (!) 24 (P) 14  Temp: 37.1 C (!) (P) 35.9 C

## 2015-12-19 NOTE — Anesthesia Procedure Notes (Signed)
Procedure Name: LMA Insertion Date/Time: 12/19/2015 9:37 AM Performed by: Denna Haggard D Pre-anesthesia Checklist: Patient identified, Emergency Drugs available, Suction available and Patient being monitored Patient Re-evaluated:Patient Re-evaluated prior to inductionOxygen Delivery Method: Circle system utilized Preoxygenation: Pre-oxygenation with 100% oxygen Intubation Type: IV induction Ventilation: Mask ventilation without difficulty LMA: LMA inserted LMA Size: 4.0 Number of attempts: 1 Airway Equipment and Method: Bite block Placement Confirmation: positive ETCO2 Tube secured with: Tape Dental Injury: Teeth and Oropharynx as per pre-operative assessment

## 2015-12-19 NOTE — Discharge Instructions (Signed)
°  Post Anesthesia Home Care Instructions ° °Activity: °Get plenty of rest for the remainder of the day. A responsible adult should stay with you for 24 hours following the procedure.  °For the next 24 hours, DO NOT: °-Drive a car °-Operate machinery °-Drink alcoholic beverages °-Take any medication unless instructed by your physician °-Make any legal decisions or sign important papers. ° °Meals: °Start with liquid foods such as gelatin or soup. Progress to regular foods as tolerated. Avoid greasy, spicy, heavy foods. If nausea and/or vomiting occur, drink only clear liquids until the nausea and/or vomiting subsides. Call your physician if vomiting continues. ° °Special Instructions/Symptoms: °Your throat may feel dry or sore from the anesthesia or the breathing tube placed in your throat during surgery. If this causes discomfort, gargle with warm salt water. The discomfort should disappear within 24 hours. ° °If you had a scopolamine patch placed behind your ear for the management of post- operative nausea and/or vomiting: ° °1. The medication in the patch is effective for 72 hours, after which it should be removed.  Wrap patch in a tissue and discard in the trash. Wash hands thoroughly with soap and water. °2. You may remove the patch earlier than 72 hours if you experience unpleasant side effects which may include dry mouth, dizziness or visual disturbances. °3. Avoid touching the patch. Wash your hands with soap and water after contact with the patch. °  °Cystoscopy patient instructions ° °Following a cystoscopy, a catheter (a flexible rubber tube) is sometimes left in place to empty the bladder. This may cause some discomfort or a feeling that you need to urinate. Your doctor determines the period of time that the catheter will be left in place. °You may have bloody urine for two to three days (Call your doctor if the amount of bleeding increases or does not subside). ° °You may pass blood clots in your urine,  especially if you had a biopsy. It is not unusual to pass small blood clots and have some bloody urine a couple of weeks after your cystoscopy. Again, call your doctor if the bleeding does not subside. °You may have: °Dysuria (painful urination) °Frequency (urinating often) °Urgency (strong desire to urinate) ° °These symptoms are common especially if medicine is instilled into the bladder or a ureteral stent is placed. Avoiding alcohol and caffeine, such as coffee, tea, and chocolate, may help relieve these symptoms. Drink plenty of water, unless otherwise instructed. Your doctor may also prescribe an antibiotic or other medicine to reduce these symptoms. ° °Cystoscopy results are available soon after the procedure; biopsy results usually take two to four days. Your doctor will discuss the results of your exam with you. Before you go home, you will be given specific instructions for follow-up care. °Special Instructions: ° °1  If you are going home with a catheter in place do not take a tub bath until removed by your doctor.  °2  You may resume your normal activities.  °3  Do not drive or operate machinery if you are taking narcotic pain medicine.  °4  Be sure to keep all follow-up appointments with your doctor.  ° °5 Call Your Doctor If: The catheter is not draining ° You have severe pain ° You are unable to urinate ° You have a fever over 101 ° You have severe bleeding °        ° °

## 2015-12-19 NOTE — Op Note (Signed)
PATIENT:  [name]  PRE-OPERATIVE DIAGNOSIS: 1. Intermittent right groin discomfort. 2. History of trans-obturator sling.  POST-OPERATIVE DIAGNOSIS: Same  PROCEDURE: 1. Cystoscopy 2. Examination under anesthesia  SURGEON:  Claybon Jabs  INDICATION: [name] is a 70 year old female who came in to see me for removal of her trans-obturator sling after having seen multiple advertisements on television regarding trans-vaginal mesh complications. She reported that she was not having any significant voiding symptoms or hematuria and that she was not experiencing any vaginal discharge to suggest an erosion. Examination in the office revealed what felt like the sling palpable beneath the mucosa but I could not definitely discern whether an erosion had occurred and therefore recommended further evaluation under anesthesia with the understanding that if I did find that her sling had eroded I would remove the portion that had caused the erosion however if no erosion was identified and I told her I did not feel that removal of the sling that was placed back in 7/06 would be indicated.  ANESTHESIA:  General  EBL:  Minimal  DRAINS: None  LOCAL MEDICATIONS USED:  None  SPECIMEN:  None  Description of procedure: After informed consent the patient was taken to the operating room and placed on the table in a supine position. General anesthesia was then administered. Once fully anesthetized the patient was moved to the dorsal lithotomy position and the genitalia were sterilely prepped and draped in standard fashion. An official timeout was then performed.  A weighted speculum was initially placed in the vagina and the patient was placed in Trendelenburg position. Palpation of the anterior vaginal wall revealed no definite erosion to palpation although I could feel an area where it felt as if the sling was likely beneath the mucosa. I noted this area and then used a Deaver retractor to aid in  visualization of the vaginal mucosa. After a great deal of scrutiny I was unable to visualize any evidence of erosion. There was no induration, inflammation or erythema to suggest this either.  I then proceeded with cystoscopy using a 23 French rigid cystoscope and 30 lens. I passed the scope into the urethra and visualized the floor of the urethra throughout its length and noted no erosion. The bladder neck appeared normal. Upon entering the bladder I noted one plus trabeculation. The bladder was fully and systematically inspected and there were no tumors, stones or inflammatory lesions. Ureteral orifices were of normal configuration and position. The bladder neck was scrutinized and no erosion or abnormality was noted at this location and normal in the urethra as I removed the scope after draining the bladder.  At this point there does not appear to be erosion and in my opinion her symptoms of intermittent slight discomfort in the right groin region are not an indication for sling removal at this time. Further evaluation with a CT scan of the pelvis will be obtained and I'm also going to place her on a nonsteroidal anti-inflammatory medication with follow-up in the office in a week.   PLAN OF CARE: Discharge to home after PACU  PATIENT DISPOSITION:  PACU - hemodynamically stable.

## 2015-12-19 NOTE — Anesthesia Preprocedure Evaluation (Signed)
Anesthesia Evaluation  Patient identified by MRN, date of birth, ID band Patient awake    Reviewed: Allergy & Precautions, NPO status , Patient's Chart, lab work & pertinent test results  History of Anesthesia Complications Negative for: history of anesthetic complications  Airway Mallampati: II  TM Distance: >3 FB Neck ROM: Full    Dental  (+) Teeth Intact,    Pulmonary former smoker,    breath sounds clear to auscultation       Cardiovascular +CHF   Rhythm:Regular     Neuro/Psych PSYCHIATRIC DISORDERS Depression Bipolar Disorder negative neurological ROS     GI/Hepatic negative GI ROS, Neg liver ROS,   Endo/Other  diabetes, Type 2, Insulin DependentHypothyroidism   Renal/GU CRFRenal disease     Musculoskeletal   Abdominal   Peds  Hematology   Anesthesia Other Findings   Reproductive/Obstetrics                             Anesthesia Physical Anesthesia Plan  ASA: III  Anesthesia Plan: General   Post-op Pain Management:    Induction: Intravenous  Airway Management Planned: LMA and Oral ETT  Additional Equipment: None  Intra-op Plan:   Post-operative Plan: Extubation in OR  Informed Consent: I have reviewed the patients History and Physical, chart, labs and discussed the procedure including the risks, benefits and alternatives for the proposed anesthesia with the patient or authorized representative who has indicated his/her understanding and acceptance.   Dental advisory given  Plan Discussed with: CRNA and Surgeon  Anesthesia Plan Comments:         Anesthesia Quick Evaluation

## 2015-12-20 ENCOUNTER — Encounter (HOSPITAL_BASED_OUTPATIENT_CLINIC_OR_DEPARTMENT_OTHER): Payer: Self-pay | Admitting: Urology

## 2015-12-20 NOTE — Anesthesia Postprocedure Evaluation (Signed)
Anesthesia Post Note  Patient: [name]  Procedure(s) Performed: Procedure(s) (LRB): CYSTOSCOPY, EXAM UNDER ANESTHESIA (N/A)  Patient location during evaluation: PACU Anesthesia Type: General Level of consciousness: awake Pain management: pain level controlled Vital Signs Assessment: post-procedure vital signs reviewed and stable Respiratory status: spontaneous breathing Cardiovascular status: stable Postop Assessment: no signs of nausea or vomiting Anesthetic complications: no    Last Vitals:  Vitals:   12/19/15 1100 12/19/15 1128  BP: (!) 96/48 (!) 108/44  Pulse:  (!) 57  Resp:  16  Temp:  36.5 C    Last Pain:  Vitals:   12/19/15 1128  TempSrc: Oral                 Rinnah Peppel

## 2015-12-30 DIAGNOSIS — R102 Pelvic and perineal pain: Secondary | ICD-10-CM | POA: Diagnosis not present

## 2015-12-30 DIAGNOSIS — R1030 Lower abdominal pain, unspecified: Secondary | ICD-10-CM | POA: Diagnosis not present

## 2016-01-03 DIAGNOSIS — R102 Pelvic and perineal pain: Secondary | ICD-10-CM | POA: Diagnosis not present

## 2016-01-04 DIAGNOSIS — E785 Hyperlipidemia, unspecified: Secondary | ICD-10-CM | POA: Diagnosis not present

## 2016-01-04 DIAGNOSIS — E559 Vitamin D deficiency, unspecified: Secondary | ICD-10-CM | POA: Diagnosis not present

## 2016-01-04 DIAGNOSIS — E1122 Type 2 diabetes mellitus with diabetic chronic kidney disease: Secondary | ICD-10-CM | POA: Diagnosis not present

## 2016-01-04 DIAGNOSIS — M810 Age-related osteoporosis without current pathological fracture: Secondary | ICD-10-CM | POA: Diagnosis not present

## 2016-01-12 DIAGNOSIS — E785 Hyperlipidemia, unspecified: Secondary | ICD-10-CM | POA: Diagnosis not present

## 2016-01-12 DIAGNOSIS — E1122 Type 2 diabetes mellitus with diabetic chronic kidney disease: Secondary | ICD-10-CM | POA: Diagnosis not present

## 2016-01-12 DIAGNOSIS — Z0000 Encounter for general adult medical examination without abnormal findings: Secondary | ICD-10-CM | POA: Diagnosis not present

## 2016-01-31 DIAGNOSIS — F3174 Bipolar disorder, in full remission, most recent episode manic: Secondary | ICD-10-CM | POA: Diagnosis not present

## 2016-04-04 DIAGNOSIS — E1122 Type 2 diabetes mellitus with diabetic chronic kidney disease: Secondary | ICD-10-CM | POA: Diagnosis not present

## 2016-04-04 DIAGNOSIS — E039 Hypothyroidism, unspecified: Secondary | ICD-10-CM | POA: Diagnosis not present

## 2016-04-04 DIAGNOSIS — M810 Age-related osteoporosis without current pathological fracture: Secondary | ICD-10-CM | POA: Diagnosis not present

## 2016-04-04 DIAGNOSIS — Z0000 Encounter for general adult medical examination without abnormal findings: Secondary | ICD-10-CM | POA: Diagnosis not present

## 2016-04-04 DIAGNOSIS — E559 Vitamin D deficiency, unspecified: Secondary | ICD-10-CM | POA: Diagnosis not present

## 2016-04-04 DIAGNOSIS — E785 Hyperlipidemia, unspecified: Secondary | ICD-10-CM | POA: Diagnosis not present

## 2016-04-04 LAB — BASIC METABOLIC PANEL
BUN: 26 — AB (ref 4–21)
BUN: 26 — AB (ref 4–21)
CREATININE: 1.2 — AB (ref <=1.1)
Creatinine: 1.2 — AB (ref <=1.1)
Glucose: 130
Glucose: 130
POTASSIUM: 4.3 (ref 3.4–5.3)
POTASSIUM: 4.3 (ref 3.4–5.3)
SODIUM: 143 (ref 137–147)
Sodium: 143 (ref 137–147)

## 2016-04-04 LAB — CBC AND DIFFERENTIAL
HCT: 42 (ref 36–46)
HEMATOCRIT: 42 (ref 36–46)
HEMOGLOBIN: 13.3 (ref 12.0–16.0)
HEMOGLOBIN: 13.3 (ref 12.0–16.0)
PLATELETS: 155 (ref 150–399)
Platelets: 155 (ref 150–399)
WBC: 4.4
WBC: 4.4

## 2016-04-04 LAB — TSH: TSH: 1.33 (ref <=5.90)

## 2016-04-04 LAB — VITAMIN D 25 HYDROXY (VIT D DEFICIENCY, FRACTURES)
VIT D 25 HYDROXY: 70
Vit D, 25-Hydroxy: 70

## 2016-04-04 LAB — HEPATIC FUNCTION PANEL
ALK PHOS: 79 (ref 25–125)
ALT: 73 — AB (ref 7–35)
ALT: 73 — AB (ref 7–35)
AST: 43 — AB (ref 13–35)
AST: 43 — AB (ref 13–35)
Alkaline Phosphatase: 79 (ref 25–125)
BILIRUBIN, TOTAL: 0.4
Bilirubin, Total: 0.4

## 2016-04-04 LAB — MICROALBUMIN, URINE
MICROALB UR: 7.9
Microalb, Ur: 7.9

## 2016-04-04 LAB — MAGNESIUM
MAGNESIUM: 2.1
Magnesium: 2.1

## 2016-04-04 LAB — HEMOGLOBIN A1C: Hemoglobin A1C: 6.1

## 2016-04-04 LAB — ESTIMATED GFR: EGFR (Non-African Amer.): 45.58

## 2016-04-12 DIAGNOSIS — E213 Hyperparathyroidism, unspecified: Secondary | ICD-10-CM | POA: Diagnosis not present

## 2016-04-12 DIAGNOSIS — E785 Hyperlipidemia, unspecified: Secondary | ICD-10-CM | POA: Diagnosis not present

## 2016-04-12 DIAGNOSIS — E1122 Type 2 diabetes mellitus with diabetic chronic kidney disease: Secondary | ICD-10-CM | POA: Diagnosis not present

## 2016-04-12 DIAGNOSIS — N183 Chronic kidney disease, stage 3 (moderate): Secondary | ICD-10-CM | POA: Diagnosis not present

## 2016-04-12 DIAGNOSIS — M810 Age-related osteoporosis without current pathological fracture: Secondary | ICD-10-CM | POA: Diagnosis not present

## 2016-04-12 LAB — HM DEXA SCAN

## 2016-05-01 DIAGNOSIS — F3174 Bipolar disorder, in full remission, most recent episode manic: Secondary | ICD-10-CM | POA: Diagnosis not present

## 2016-06-27 ENCOUNTER — Encounter: Payer: Self-pay | Admitting: Gynecology

## 2016-06-28 ENCOUNTER — Other Ambulatory Visit: Payer: Self-pay | Admitting: Internal Medicine

## 2016-06-28 DIAGNOSIS — Z1231 Encounter for screening mammogram for malignant neoplasm of breast: Secondary | ICD-10-CM

## 2016-07-30 DIAGNOSIS — F3174 Bipolar disorder, in full remission, most recent episode manic: Secondary | ICD-10-CM | POA: Diagnosis not present

## 2016-07-30 DIAGNOSIS — F5105 Insomnia due to other mental disorder: Secondary | ICD-10-CM | POA: Diagnosis not present

## 2016-08-03 ENCOUNTER — Ambulatory Visit
Admission: RE | Admit: 2016-08-03 | Discharge: 2016-08-03 | Disposition: A | Discharge status: Home / Self Care | Payer: Medicare Other | Source: Ambulatory Visit | Attending: Internal Medicine | Admitting: Internal Medicine

## 2016-08-03 DIAGNOSIS — Z1231 Encounter for screening mammogram for malignant neoplasm of breast: Secondary | ICD-10-CM

## 2016-08-03 LAB — HM MAMMOGRAPHY

## 2016-10-09 DIAGNOSIS — E213 Hyperparathyroidism, unspecified: Secondary | ICD-10-CM | POA: Diagnosis not present

## 2016-10-09 DIAGNOSIS — E1122 Type 2 diabetes mellitus with diabetic chronic kidney disease: Secondary | ICD-10-CM | POA: Diagnosis not present

## 2016-10-09 DIAGNOSIS — E559 Vitamin D deficiency, unspecified: Secondary | ICD-10-CM | POA: Diagnosis not present

## 2016-10-09 DIAGNOSIS — N183 Chronic kidney disease, stage 3 (moderate): Secondary | ICD-10-CM | POA: Diagnosis not present

## 2016-10-09 DIAGNOSIS — E039 Hypothyroidism, unspecified: Secondary | ICD-10-CM | POA: Diagnosis not present

## 2016-10-09 DIAGNOSIS — E785 Hyperlipidemia, unspecified: Secondary | ICD-10-CM | POA: Diagnosis not present

## 2016-10-09 DIAGNOSIS — N390 Urinary tract infection, site not specified: Secondary | ICD-10-CM | POA: Diagnosis not present

## 2016-10-09 LAB — BASIC METABOLIC PANEL
BUN: 19 (ref 4–21)
Creatinine: 1.2 — AB (ref <=1.1)
Glucose: 119
Potassium: 4.4 (ref 3.4–5.3)
SODIUM: 145 (ref 137–147)

## 2016-10-09 LAB — HEPATIC FUNCTION PANEL
ALT: 79 — AB (ref 7–35)
AST: 61 — AB (ref 13–35)
Alkaline Phosphatase: 62 (ref 25–125)
BILIRUBIN, TOTAL: 0.6

## 2016-10-09 LAB — LIPID PANEL
Cholesterol: 215 — AB (ref 0–200)
HDL: 47 (ref 35–70)
LDL CALC: 136
TRIGLYCERIDES: 160 (ref 40–160)

## 2016-10-09 LAB — CBC AND DIFFERENTIAL
HCT: 40 (ref 36–46)
Hemoglobin: 13.1 (ref 12.0–16.0)
PLATELETS: 142 — AB (ref 150–399)
WBC: 4.6

## 2016-10-09 LAB — HEMOGLOBIN A1C: Hemoglobin A1C: 6.4

## 2016-10-09 LAB — VITAMIN D 25 HYDROXY (VIT D DEFICIENCY, FRACTURES): VIT D 25 HYDROXY: 74

## 2016-10-09 LAB — MAGNESIUM: Magnesium: 2.2

## 2016-10-16 DIAGNOSIS — E1122 Type 2 diabetes mellitus with diabetic chronic kidney disease: Secondary | ICD-10-CM | POA: Diagnosis not present

## 2016-10-16 DIAGNOSIS — M546 Pain in thoracic spine: Secondary | ICD-10-CM | POA: Diagnosis not present

## 2016-10-16 DIAGNOSIS — E213 Hyperparathyroidism, unspecified: Secondary | ICD-10-CM | POA: Diagnosis not present

## 2016-10-16 DIAGNOSIS — Z0000 Encounter for general adult medical examination without abnormal findings: Secondary | ICD-10-CM | POA: Diagnosis not present

## 2016-10-16 DIAGNOSIS — N183 Chronic kidney disease, stage 3 (moderate): Secondary | ICD-10-CM | POA: Diagnosis not present

## 2016-10-16 DIAGNOSIS — F319 Bipolar disorder, unspecified: Secondary | ICD-10-CM | POA: Diagnosis not present

## 2016-10-16 DIAGNOSIS — M549 Dorsalgia, unspecified: Secondary | ICD-10-CM | POA: Diagnosis not present

## 2016-10-17 ENCOUNTER — Other Ambulatory Visit: Payer: Self-pay | Admitting: Internal Medicine

## 2016-10-17 DIAGNOSIS — R740 Nonspecific elevation of levels of transaminase and lactic acid dehydrogenase [LDH]: Principal | ICD-10-CM

## 2016-10-17 DIAGNOSIS — R7401 Elevation of levels of liver transaminase levels: Secondary | ICD-10-CM

## 2016-10-17 DIAGNOSIS — R1310 Dysphagia, unspecified: Secondary | ICD-10-CM

## 2016-10-25 ENCOUNTER — Ambulatory Visit
Admission: RE | Admit: 2016-10-25 | Discharge: 2016-10-25 | Disposition: A | Discharge status: Home / Self Care | Payer: Medicare Other | Source: Ambulatory Visit | Attending: Internal Medicine | Admitting: Internal Medicine

## 2016-10-25 DIAGNOSIS — R740 Nonspecific elevation of levels of transaminase and lactic acid dehydrogenase [LDH]: Principal | ICD-10-CM

## 2016-10-25 DIAGNOSIS — R1310 Dysphagia, unspecified: Secondary | ICD-10-CM

## 2016-10-25 DIAGNOSIS — N189 Chronic kidney disease, unspecified: Secondary | ICD-10-CM | POA: Diagnosis not present

## 2016-10-25 DIAGNOSIS — R7401 Elevation of levels of liver transaminase levels: Secondary | ICD-10-CM

## 2016-10-25 DIAGNOSIS — E785 Hyperlipidemia, unspecified: Secondary | ICD-10-CM | POA: Diagnosis not present

## 2016-10-31 DIAGNOSIS — K7689 Other specified diseases of liver: Secondary | ICD-10-CM | POA: Diagnosis not present

## 2016-11-01 ENCOUNTER — Other Ambulatory Visit: Payer: Self-pay | Admitting: Internal Medicine

## 2016-11-01 DIAGNOSIS — F5105 Insomnia due to other mental disorder: Secondary | ICD-10-CM | POA: Diagnosis not present

## 2016-11-01 DIAGNOSIS — F3174 Bipolar disorder, in full remission, most recent episode manic: Secondary | ICD-10-CM | POA: Diagnosis not present

## 2016-11-02 ENCOUNTER — Other Ambulatory Visit: Payer: Self-pay | Admitting: Internal Medicine

## 2016-11-02 DIAGNOSIS — K7689 Other specified diseases of liver: Secondary | ICD-10-CM

## 2016-11-05 DIAGNOSIS — F3174 Bipolar disorder, in full remission, most recent episode manic: Secondary | ICD-10-CM | POA: Diagnosis not present

## 2016-11-21 LAB — HM DIABETES EYE EXAM

## 2016-11-23 ENCOUNTER — Other Ambulatory Visit: Payer: Medicare Other

## 2016-11-23 DIAGNOSIS — H524 Presbyopia: Secondary | ICD-10-CM | POA: Diagnosis not present

## 2016-11-23 DIAGNOSIS — H2513 Age-related nuclear cataract, bilateral: Secondary | ICD-10-CM | POA: Diagnosis not present

## 2016-11-26 ENCOUNTER — Ambulatory Visit
Admission: RE | Admit: 2016-11-26 | Discharge: 2016-11-26 | Disposition: A | Discharge status: Home / Self Care | Payer: Medicare Other | Source: Ambulatory Visit | Attending: Internal Medicine | Admitting: Internal Medicine

## 2016-11-26 DIAGNOSIS — K8020 Calculus of gallbladder without cholecystitis without obstruction: Secondary | ICD-10-CM | POA: Diagnosis not present

## 2016-11-26 DIAGNOSIS — K7689 Other specified diseases of liver: Secondary | ICD-10-CM

## 2016-11-26 MED ORDER — GADOBENATE DIMEGLUMINE 529 MG/ML IV SOLN: 6.0000 mL | Freq: Once | INTRAVENOUS | Status: DC | PRN

## 2016-12-04 ENCOUNTER — Other Ambulatory Visit: Payer: Self-pay | Admitting: Gastroenterology

## 2016-12-04 DIAGNOSIS — R935 Abnormal findings on diagnostic imaging of other abdominal regions, including retroperitoneum: Secondary | ICD-10-CM

## 2016-12-04 DIAGNOSIS — K760 Fatty (change of) liver, not elsewhere classified: Secondary | ICD-10-CM

## 2016-12-04 DIAGNOSIS — K7460 Unspecified cirrhosis of liver: Secondary | ICD-10-CM | POA: Diagnosis not present

## 2016-12-04 DIAGNOSIS — R933 Abnormal findings on diagnostic imaging of other parts of digestive tract: Secondary | ICD-10-CM | POA: Diagnosis not present

## 2016-12-04 DIAGNOSIS — R7989 Other specified abnormal findings of blood chemistry: Secondary | ICD-10-CM

## 2016-12-04 DIAGNOSIS — K8020 Calculus of gallbladder without cholecystitis without obstruction: Secondary | ICD-10-CM | POA: Diagnosis not present

## 2016-12-04 DIAGNOSIS — R945 Abnormal results of liver function studies: Principal | ICD-10-CM

## 2016-12-04 DIAGNOSIS — K5904 Chronic idiopathic constipation: Secondary | ICD-10-CM | POA: Diagnosis not present

## 2016-12-06 DIAGNOSIS — K760 Fatty (change of) liver, not elsewhere classified: Secondary | ICD-10-CM | POA: Diagnosis not present

## 2016-12-06 DIAGNOSIS — K7460 Unspecified cirrhosis of liver: Secondary | ICD-10-CM | POA: Diagnosis not present

## 2016-12-11 DIAGNOSIS — H2511 Age-related nuclear cataract, right eye: Secondary | ICD-10-CM | POA: Diagnosis not present

## 2016-12-11 DIAGNOSIS — H25811 Combined forms of age-related cataract, right eye: Secondary | ICD-10-CM | POA: Diagnosis not present

## 2017-01-08 DIAGNOSIS — K5904 Chronic idiopathic constipation: Secondary | ICD-10-CM | POA: Diagnosis not present

## 2017-01-08 DIAGNOSIS — K8080 Other cholelithiasis without obstruction: Secondary | ICD-10-CM | POA: Diagnosis not present

## 2017-01-08 DIAGNOSIS — K760 Fatty (change of) liver, not elsewhere classified: Secondary | ICD-10-CM | POA: Diagnosis not present

## 2017-01-08 DIAGNOSIS — K7460 Unspecified cirrhosis of liver: Secondary | ICD-10-CM | POA: Diagnosis not present

## 2017-01-08 DIAGNOSIS — R933 Abnormal findings on diagnostic imaging of other parts of digestive tract: Secondary | ICD-10-CM | POA: Diagnosis not present

## 2017-01-29 DIAGNOSIS — H25812 Combined forms of age-related cataract, left eye: Secondary | ICD-10-CM | POA: Diagnosis not present

## 2017-01-29 DIAGNOSIS — F3174 Bipolar disorder, in full remission, most recent episode manic: Secondary | ICD-10-CM | POA: Diagnosis not present

## 2017-01-29 DIAGNOSIS — F5105 Insomnia due to other mental disorder: Secondary | ICD-10-CM | POA: Diagnosis not present

## 2017-01-29 DIAGNOSIS — H2512 Age-related nuclear cataract, left eye: Secondary | ICD-10-CM | POA: Diagnosis not present

## 2017-01-31 DIAGNOSIS — F3174 Bipolar disorder, in full remission, most recent episode manic: Secondary | ICD-10-CM | POA: Diagnosis not present

## 2017-01-31 DIAGNOSIS — Z79899 Other long term (current) drug therapy: Secondary | ICD-10-CM | POA: Diagnosis not present

## 2017-02-11 DIAGNOSIS — F5105 Insomnia due to other mental disorder: Secondary | ICD-10-CM | POA: Diagnosis not present

## 2017-02-11 DIAGNOSIS — F3174 Bipolar disorder, in full remission, most recent episode manic: Secondary | ICD-10-CM | POA: Diagnosis not present

## 2017-02-28 LAB — HM DIABETES EYE EXAM

## 2017-04-09 DIAGNOSIS — E1122 Type 2 diabetes mellitus with diabetic chronic kidney disease: Secondary | ICD-10-CM | POA: Diagnosis not present

## 2017-04-09 DIAGNOSIS — E039 Hypothyroidism, unspecified: Secondary | ICD-10-CM | POA: Diagnosis not present

## 2017-04-09 DIAGNOSIS — Z0000 Encounter for general adult medical examination without abnormal findings: Secondary | ICD-10-CM | POA: Diagnosis not present

## 2017-04-09 DIAGNOSIS — E213 Hyperparathyroidism, unspecified: Secondary | ICD-10-CM | POA: Diagnosis not present

## 2017-04-09 DIAGNOSIS — E559 Vitamin D deficiency, unspecified: Secondary | ICD-10-CM | POA: Diagnosis not present

## 2017-04-09 DIAGNOSIS — N184 Chronic kidney disease, stage 4 (severe): Secondary | ICD-10-CM | POA: Diagnosis not present

## 2017-04-09 DIAGNOSIS — E785 Hyperlipidemia, unspecified: Secondary | ICD-10-CM | POA: Diagnosis not present

## 2017-04-09 DIAGNOSIS — N390 Urinary tract infection, site not specified: Secondary | ICD-10-CM | POA: Diagnosis not present

## 2017-04-09 LAB — CBC AND DIFFERENTIAL
HCT: 41 (ref 36–46)
HEMOGLOBIN: 13.2 (ref 12.0–16.0)
Platelets: 163 (ref 150–399)
WBC: 4

## 2017-04-09 LAB — MAGNESIUM: Magnesium: 2.1

## 2017-04-09 LAB — HEPATIC FUNCTION PANEL
ALT: 66 — AB (ref 7–35)
AST: 43 — AB (ref 13–35)
Alkaline Phosphatase: 78 (ref 25–125)
Bilirubin, Total: 0.5

## 2017-04-09 LAB — LIPID PANEL
Cholesterol: 194 (ref 0–200)
HDL: 52 (ref 35–70)
LDL Cholesterol: 123
TRIGLYCERIDES: 95 (ref 40–160)

## 2017-04-09 LAB — HEMOGLOBIN A1C: Hemoglobin A1C: 6.3

## 2017-04-09 LAB — BASIC METABOLIC PANEL
BUN: 21 (ref 4–21)
Creatinine: 1.2 — AB (ref <=1.1)
Glucose: 103
POTASSIUM: 4.1 (ref 3.4–5.3)
Sodium: 148 — AB (ref 137–147)

## 2017-04-09 LAB — VITAMIN D 25 HYDROXY (VIT D DEFICIENCY, FRACTURES): VIT D 25 HYDROXY: 83

## 2017-04-09 LAB — TSH: TSH: 0.26 — AB (ref <=5.90)

## 2017-04-15 DIAGNOSIS — N183 Chronic kidney disease, stage 3 (moderate): Secondary | ICD-10-CM | POA: Diagnosis not present

## 2017-04-15 DIAGNOSIS — E213 Hyperparathyroidism, unspecified: Secondary | ICD-10-CM | POA: Diagnosis not present

## 2017-04-15 DIAGNOSIS — Z0000 Encounter for general adult medical examination without abnormal findings: Secondary | ICD-10-CM | POA: Diagnosis not present

## 2017-04-15 DIAGNOSIS — E1122 Type 2 diabetes mellitus with diabetic chronic kidney disease: Secondary | ICD-10-CM | POA: Diagnosis not present

## 2017-04-15 DIAGNOSIS — E785 Hyperlipidemia, unspecified: Secondary | ICD-10-CM | POA: Diagnosis not present

## 2017-04-15 DIAGNOSIS — E039 Hypothyroidism, unspecified: Secondary | ICD-10-CM | POA: Diagnosis not present

## 2017-04-15 DIAGNOSIS — F319 Bipolar disorder, unspecified: Secondary | ICD-10-CM | POA: Diagnosis not present

## 2017-04-15 DIAGNOSIS — M810 Age-related osteoporosis without current pathological fracture: Secondary | ICD-10-CM | POA: Diagnosis not present

## 2017-04-15 DIAGNOSIS — E870 Hyperosmolality and hypernatremia: Secondary | ICD-10-CM | POA: Diagnosis not present

## 2017-04-18 NOTE — Progress Notes (Addendum)
Subjective:    Patient ID: [name], female    DOB: Oct 04, 1945, 72 y.o.   MRN: [id number]  HPI:  [name] presents to establish as a new pt.  She is a pleasant 72 year old female. PMH: Bipolar 1 dx'[name] in her 69s, acute encephalopathy-2016 (CVA ruled out, repeat MRI with contrast recommended in 01/2015, do not see imaging study in system), dx'[name] with diabetes 2016 with HHS/DKA, initial A1c - 12.8.  With diet/exercise she was able to stop injectable therapy and has not been on antidiabetic medication since 2017. Last A1c 6.3, drawn 03/2617 Parathyroidectomy-2014 Hx of osteoporosis Hx of Chronic diastolic dysfunction- she denies ever being on any cardiac medciations She reports medication compliance, denies SE She has been intolerant to statin therapy in past, re: myalgia's  She reports following diabetic diet and walks daily  Patient Care Team    Relationship Specialty Notifications Start End  Barbara Marble D, NP PCP - General Family Medicine  04/23/17   Barbara Craver, MD Consulting Physician Gastroenterology  04/23/17   Barbara Kraft, MD Consulting Physician Endocrinology  04/23/17   Barbara Kenner, MD  Dermatology  04/23/17   Barbara Mass, MD  Gynecology  04/23/17   Barbara Rhodes, MD Consulting Physician Urology  04/23/17   Barbara Apo, MD Consulting Physician Ophthalmology  04/23/17   Barbara Mann, MD Referring Physician Psychiatry  04/23/17     Patient Active Problem List   Diagnosis Date Noted  . Diabetes mellitus due to non-steroid drug without complication ([place]) 32/67/1245  . Healthcare maintenance 04/23/2017  . Type 2 diabetes mellitus ([place]) 04/23/2017  . Hyperlipidemia 04/23/2017  . Decreased GFR 04/23/2017  . Elevated LFTs 04/23/2017  . Osteoporosis 08/23/2015  . Primary hyperparathyroidism ([place]) 03/27/2012  . Abdominal pain   . Hyponatremia 02/13/2012  . Chronic diastolic CHF (congestive heart failure) ([place]) 02/13/2012  . Hypothyroidism 02/11/2012  . CVA (cerebral  infarction) 02/10/2012  . Ataxia 02/10/2012  . Bipolar 1 disorder ([place]) 02/10/2012  . Hypotension, postural 02/09/2012    Class: Acute  . Bipolar 1 disorder, manic, moderate ([place]) 02/05/2012     Past Medical History:  Diagnosis Date  . Bipolar disorder ([place])   . Chills without fever    intermittant  . Depression   . History of colon polyps    2009 TUBULAR ADENOMA  . History of concussion    02-05-2012--  fell and hit head (had previous falls prior to this day) dx mild concussion -- per MRI and neurologist note (dr Leonie Man)  right frontal lob w/ shear injuries consistant with fall/trauma  . History of Graves' disease    s/p  radiactive iodine therapy 1989  . History of primary hyperparathyroidism    W/  HYPERCALCEMIA--  s/p  left inferior parathyroidectomy 2014  . Hyperlipidemia   . Hypothyroidism, postradioiodine therapy 1989  approx   endocrinologist-  dr Gaylene Brooks [facility]  . Increased urinary frequency   . Osteoporosis   . Type 2 diabetes mellitus ([place])      Past Surgical History:  Procedure Laterality Date  . CYSTOSCOPY N/A 12/19/2015   Procedure: Rolly Salter UNDER ANESTHESIA;  Surgeon: Barbara Rhodes, MD;  Location: [facility];  Service: Urology;  Laterality: N/A;  . PARATHYROIDECTOMY Left 03/27/2012   Procedure: Left Inferior Mininally Invasive PARATHYROIDECTOMY;  Surgeon: Earnstine Regal, MD;  Location: [facility];  Service: General;  Laterality: Left;  Left Inferior Mininally Invasive Parathyroidectomy---  hypercellular adenoma  . TRANSOBTURATOR SLING  09/01/2004  . transvaginal mesh placement    . TUBAL LIGATION  1978  . VAGINAL HYSTERECTOMY  1986    WITH OVARIAN PRESERVATION      Family History  Problem Relation Age of Onset  . Cancer Mother        MELANOMA  . Diabetes Maternal Aunt   . Heart attack Brother   . Diabetes Maternal Uncle      Social History   Substance and Sexual Activity  Drug Use No     Social History    Substance and Sexual Activity  Alcohol Use No  . Alcohol/week: 0.0 oz     Social History   Tobacco Use  Smoking Status Former Smoker  . Packs/day: 1.00  . Years: 30.00  . Pack years: 30.00  . Types: Cigarettes  . Last attempt to quit: 03/18/1997  . Years since quitting: 20.1  Smokeless Tobacco Never Used     Outpatient Encounter Medications as of 04/23/2017  Medication Sig  . acetaminophen (TYLENOL) 500 MG tablet Take 500 mg by mouth every 6 (six) hours as needed. For pain  . alendronate (FOSAMAX) 70 MG tablet Take 70 mg by mouth once a week. Take with a full glass of water on an empty stomach.  . calcipotriene-betamethasone (TACLONEX SCALP) external suspension Apply topically daily as needed.   . clobetasol cream (TEMOVATE) 0.96 % Apply 1 application topically 2 (two) times daily as needed.   . fluticasone (CUTIVATE) 0.05 % cream Apply topically 2 (two) times daily as needed.   Marland Kitchen ketoconazole (NIZORAL) 2 % cream Apply 1 application topically daily as needed.   Marland Kitchen levothyroxine (SYNTHROID, LEVOTHROID) 88 MCG tablet Take 88 mcg by mouth daily before breakfast.  . SEROQUEL 50 MG tablet Take 1 tablet by mouth at bedtime.  Marland Kitchen FREESTYLE LITE test strip   . Lancets (FREESTYLE) lancets   . [DISCONTINUED] cholecalciferol (VITAMIN [name]) 1000 UNITS tablet Take 1,000 Units by mouth every morning.   . [DISCONTINUED] divalproex (DEPAKOTE ER) 250 MG 24 hr tablet Take 500 mg by mouth at bedtime.   . [DISCONTINUED] insulin glargine (LANTUS) 100 UNIT/ML injection Inject 12 Units into the skin at bedtime.   . [DISCONTINUED] meloxicam (MOBIC) 15 MG tablet Take 1 tablet (15 mg total) by mouth daily.  . [DISCONTINUED] temazepam (RESTORIL) 30 MG capsule Take 60 mg by mouth at bedtime.   No facility-administered encounter medications on file as of 04/23/2017.     Allergies: Aspirin; Lipitor [atorvastatin calcium]; Trimethoprim; Vivelle [estradiol]; Zetia [ezetimibe]; Zocor [simvastatin]; and Sulfa  antibiotics  Body [name] index is 23.59 kg/m.  Blood pressure (!) 101/58, pulse 66, height 5\' 2"  (1.575 m), weight 129 lb (58.5 kg), SpO2 100 %.    Review of Systems  Constitutional: Positive for fatigue. Negative for activity change, appetite change, chills, diaphoresis, fever and unexpected weight change.  Eyes: Negative for visual disturbance.  Respiratory: Negative for cough, chest tightness, shortness of breath, wheezing and stridor.   Cardiovascular: Negative for chest pain, palpitations and leg swelling.  Gastrointestinal: Negative for abdominal distention, abdominal pain, blood in stool, constipation, diarrhea, nausea and vomiting.  Genitourinary: Positive for frequency. Negative for difficulty urinating.  Skin: Negative for color change, pallor, rash and wound.  Neurological: Negative for dizziness and headaches.  Hematological: Does not bruise/bleed easily.  Psychiatric/Behavioral: Negative for dysphoric mood, hallucinations, self-injury, sleep disturbance and suicidal ideas. The patient is not nervous/anxious and is not hyperactive.        Objective:   Physical Exam  Constitutional: She appears well-developed and well-nourished. No distress.  HENT:  Head: Normocephalic and atraumatic.  Right Ear: External ear normal.  Left Ear: External ear normal.  Eyes: Conjunctivae are normal. Pupils are equal, round, and reactive to light.  Cardiovascular: Normal rate, regular rhythm, normal heart sounds and intact distal pulses.  Pulmonary/Chest: Effort normal and breath sounds normal. No respiratory distress. She has no wheezes. She has no rales. She exhibits no tenderness.  Neurological: She is alert.  Skin: She is not diaphoretic.          Assessment & Plan:   1. Need for pneumococcal vaccination   2. Healthcare maintenance   3. Diabetes mellitus due to non-steroid drug without complication ([place])   4. Hypothyroidism, unspecified type   5. Other osteoporosis, unspecified  pathological fracture presence   6. Hypotension, postural   7. Bipolar 1 disorder ([place])   8. Hyperlipidemia, unspecified hyperlipidemia type   9. Decreased GFR   10. Elevated LFTs     Diabetes mellitus due to non-steroid drug without complication ([facility]) Initial dx 2016 Has been off all diabetic meds since 2017 A1c 6.3 drawn 04/09/17 She denies episodes of hypoglycemia  Hypotension, postural BP 100/58, HR 66 She denies HA/dizziness   Bipolar 1 disorder Stable Denies of thoughts of harming herself/others Followed by Dr. Kapur/psychiatrist Q3Months Currently on Seroquel 50mg  QHS  Hyperlipidemia Previously intolerant to statins, re: myalgia's Last LDL 123, drawn 04/09/17  Hypothyroidism TSH- 0.26, drawn 04/09/17 Currently on levothyroxine 31mcg/day Will re-check in 4 months   CVA (cerebral infarction) [facility]- " Acute encephalopathy. The patient was confused upon presentation to the Emergency Department. CT head showed a focal hypodensity in the right frontal lobe. Given our concern for acute CVA, the patient underwent an MR brain, which showed a lesion within the right frontal subcortical white matter. This was not thought to represent an area of acute stroke or hemorrhage. The patient will need a followup MRI with contrast in three months to assess stability. Mental status improved and was at baseline at time of discharge" No repeat imaging in system   Elevated LFTs Hx of LFTs She denies RUQ pain or Acetaminophen/ETOH use  Decreased GFR 04/09/17 GFR- 48 Chronic decreased GFR   Pt was in the office today for 25+ minutes, with over 50% time spent in face to face counseling of patient's various medical conditions and in coordination of care  FOLLOW-UP:  Return in about 4 months (around 08/23/2017) for Regular Follow Up, Diabetes, Thyroid Disorder.

## 2017-04-23 ENCOUNTER — Encounter: Payer: Self-pay | Admitting: Adult Health

## 2017-04-23 ENCOUNTER — Ambulatory Visit (INDEPENDENT_AMBULATORY_CARE_PROVIDER_SITE_OTHER): Payer: Medicare Other | Admitting: Adult Health

## 2017-04-23 VITALS — BP 101/58 | HR 66 | Ht 62.0 in | Wt 129.0 lb

## 2017-04-23 DIAGNOSIS — R944 Abnormal results of kidney function studies: Secondary | ICD-10-CM | POA: Diagnosis not present

## 2017-04-23 DIAGNOSIS — E099 Drug or chemical induced diabetes mellitus without complications: Secondary | ICD-10-CM

## 2017-04-23 DIAGNOSIS — N183 Chronic kidney disease, stage 3 unspecified: Secondary | ICD-10-CM | POA: Insufficient documentation

## 2017-04-23 DIAGNOSIS — E039 Hypothyroidism, unspecified: Secondary | ICD-10-CM | POA: Diagnosis not present

## 2017-04-23 DIAGNOSIS — F319 Bipolar disorder, unspecified: Secondary | ICD-10-CM

## 2017-04-23 DIAGNOSIS — Z0000 Encounter for general adult medical examination without abnormal findings: Secondary | ICD-10-CM | POA: Diagnosis not present

## 2017-04-23 DIAGNOSIS — N1832 Chronic kidney disease, stage 3b: Secondary | ICD-10-CM | POA: Insufficient documentation

## 2017-04-23 DIAGNOSIS — R7989 Other specified abnormal findings of blood chemistry: Secondary | ICD-10-CM | POA: Insufficient documentation

## 2017-04-23 DIAGNOSIS — I951 Orthostatic hypotension: Secondary | ICD-10-CM

## 2017-04-23 DIAGNOSIS — R945 Abnormal results of liver function studies: Secondary | ICD-10-CM

## 2017-04-23 DIAGNOSIS — Z23 Encounter for immunization: Secondary | ICD-10-CM

## 2017-04-23 DIAGNOSIS — M818 Other osteoporosis without current pathological fracture: Secondary | ICD-10-CM | POA: Diagnosis not present

## 2017-04-23 DIAGNOSIS — E119 Type 2 diabetes mellitus without complications: Secondary | ICD-10-CM | POA: Insufficient documentation

## 2017-04-23 DIAGNOSIS — T50905A Adverse effect of unspecified drugs, medicaments and biological substances, initial encounter: Secondary | ICD-10-CM | POA: Diagnosis not present

## 2017-04-23 DIAGNOSIS — E785 Hyperlipidemia, unspecified: Secondary | ICD-10-CM | POA: Diagnosis not present

## 2017-04-23 DIAGNOSIS — T39395A Adverse effect of other nonsteroidal anti-inflammatory drugs [NSAID], initial encounter: Secondary | ICD-10-CM | POA: Insufficient documentation

## 2017-04-23 NOTE — Assessment & Plan Note (Addendum)
TSH- 0.26, drawn 04/09/17 Currently on levothyroxine 6mcg/day Will re-check in 4 months

## 2017-04-23 NOTE — Assessment & Plan Note (Addendum)
Initial dx 2016 Has been off all diabetic meds since 2017 A1c 6.3 drawn 04/09/17 She denies episodes of hypoglycemia

## 2017-04-23 NOTE — Assessment & Plan Note (Addendum)
Stable Denies of thoughts of harming herself/others Followed by Dr. Kapur/psychiatrist Q3Months Currently on Seroquel 50mg  QHS

## 2017-04-23 NOTE — Assessment & Plan Note (Signed)
10/15/2014 [facility]- " Acute encephalopathy. The patient was confused upon presentation to the Emergency Department. CT head showed a focal hypodensity in the right frontal lobe. Given our concern for acute CVA, the patient underwent an MR brain, which showed a lesion within the right frontal subcortical white matter. This was not thought to represent an area of acute stroke or hemorrhage. The patient will need a followup MRI with contrast in three months to assess stability. Mental status improved and was at baseline at time of discharge" No repeat imaging in system

## 2017-04-23 NOTE — Assessment & Plan Note (Signed)
04/09/17 GFR- 48 Chronic decreased GFR

## 2017-04-23 NOTE — Assessment & Plan Note (Addendum)
Previously intolerant to statins, re: myalgia's Last LDL 123, drawn 04/09/17

## 2017-04-23 NOTE — Assessment & Plan Note (Signed)
BP 100/58, HR 66 She denies HA/dizziness

## 2017-04-23 NOTE — Assessment & Plan Note (Signed)
Hx of LFTs She denies RUQ pain or Acetaminophen/ETOH use

## 2017-04-23 NOTE — Patient Instructions (Signed)
Heart-Healthy Eating Plan Heart-healthy meal planning includes:  Limiting unhealthy fats.  Increasing healthy fats.  Making other small dietary changes.  You may need to talk with your doctor or a diet specialist (dietitian) to create an eating plan that is right for you. What types of fat should I choose?  Choose healthy fats. These include olive oil and canola oil, flaxseeds, walnuts, almonds, and seeds.  Eat more omega-3 fats. These include salmon, mackerel, sardines, tuna, flaxseed oil, and ground flaxseeds. Try to eat fish at least twice each week.  Limit saturated fats. ? Saturated fats are often found in animal products, such as meats, butter, and cream. ? Plant sources of saturated fats include palm oil, palm kernel oil, and coconut oil.  Avoid foods with partially hydrogenated oils in them. These include stick margarine, some tub margarines, cookies, crackers, and other baked goods. These contain trans fats. What general guidelines do I need to follow?  Check food labels carefully. Identify foods with trans fats or high amounts of saturated fat.  Fill one half of your plate with vegetables and green salads. Eat 4-5 servings of vegetables per day. A serving of vegetables is: ? 1 cup of raw leafy vegetables. ?  cup of raw or cooked cut-up vegetables. ?  cup of vegetable juice.  Fill one fourth of your plate with whole grains. Look for the word "whole" as the first word in the ingredient list.  Fill one fourth of your plate with lean protein foods.  Eat 4-5 servings of fruit per day. A serving of fruit is: ? One medium whole fruit. ?  cup of dried fruit. ?  cup of fresh, frozen, or canned fruit. ?  cup of 100% fruit juice.  Eat more foods that contain soluble fiber. These include apples, broccoli, carrots, beans, peas, and barley. Try to get 20-30 g of fiber per day.  Eat more home-cooked food. Eat less restaurant, buffet, and fast food.  Limit or avoid  alcohol.  Limit foods high in starch and sugar.  Avoid fried foods.  Avoid frying your food. Try baking, boiling, grilling, or broiling it instead. You can also reduce fat by: ? Removing the skin from poultry. ? Removing all visible fats from meats. ? Skimming the fat off of stews, soups, and gravies before serving them. ? Steaming vegetables in water or broth.  Lose weight if you are overweight.  Eat 4-5 servings of nuts, legumes, and seeds per week: ? One serving of dried beans or legumes equals  cup after being cooked. ? One serving of nuts equals 1 ounces. ? One serving of seeds equals  ounce or one tablespoon.  You may need to keep track of how much salt or sodium you eat. This is especially true if you have high blood pressure. Talk with your doctor or dietitian to get more information. What foods can I eat? Grains Breads, including French, white, pita, wheat, raisin, rye, oatmeal, and Italian. Tortillas that are neither fried nor made with lard or trans fat. Low-fat rolls, including hotdog and hamburger buns and English muffins. Biscuits. Muffins. Waffles. Pancakes. Light popcorn. Whole-grain cereals. Flatbread. Melba toast. Pretzels. Breadsticks. Rusks. Low-fat snacks. Low-fat crackers, including oyster, saltine, matzo, graham, animal, and rye. Rice and pasta, including brown rice and pastas that are made with whole wheat. Vegetables All vegetables. Fruits All fruits, but limit coconut. Meats and Other Protein Sources Lean, well-trimmed beef, veal, pork, and lamb. Chicken and turkey without skin. All fish and shellfish.   Wild duck, rabbit, pheasant, and venison. Egg whites or low-cholesterol egg substitutes. Dried beans, peas, lentils, and tofu. Seeds and most nuts. Dairy Low-fat or nonfat cheeses, including ricotta, string, and mozzarella. Skim or 1% milk that is liquid, powdered, or evaporated. Buttermilk that is made with low-fat milk. Nonfat or low-fat  yogurt. Beverages Mineral water. Diet carbonated beverages. Sweets and Desserts Sherbets and fruit ices. Honey, jam, marmalade, jelly, and syrups. Meringues and gelatins. Pure sugar candy, such as hard candy, jelly beans, gumdrops, mints, marshmallows, and small amounts of dark chocolate. [employer] Eat all sweets and desserts in moderation. Fats and Oils Nonhydrogenated (trans-free) margarines. Vegetable oils, including soybean, sesame, sunflower, olive, peanut, safflower, corn, canola, and cottonseed. Salad dressings or mayonnaise made with a vegetable oil. Limit added fats and oils that you use for cooking, baking, salads, and as spreads. Other Cocoa powder. Coffee and tea. All seasonings and condiments. The items listed above may not be a complete list of recommended foods or beverages. Contact your dietitian for more options. What foods are not recommended? Grains Breads that are made with saturated or trans fats, oils, or whole milk. Croissants. Butter rolls. Cheese breads. Sweet rolls. Donuts. Buttered popcorn. Chow mein noodles. High-fat crackers, such as cheese or butter crackers. Meats and Other Protein Sources Fatty meats, such as hotdogs, short ribs, sausage, spareribs, bacon, rib eye roast or steak, and mutton. High-fat deli meats, such as salami and bologna. Caviar. Domestic duck and goose. Organ meats, such as kidney, liver, sweetbreads, and heart. Dairy Cream, sour cream, cream cheese, and creamed cottage cheese. Whole-milk cheeses, including blue (bleu), Monte[place] Jack, [place], [place], American, [place], Swiss, cheddar, [place], and [place]. Whole or 2% milk that is liquid, evaporated, or condensed. Whole buttermilk. Cream sauce or high-fat cheese sauce. Yogurt that is made from whole milk. Beverages Regular sodas and juice drinks with added sugar. Sweets and Desserts Frosting. Pudding. Cookies. Cakes other than angel food cake. Candy that has milk chocolate or white  chocolate, hydrogenated fat, butter, coconut, or unknown ingredients. Buttered syrups. Full-fat ice cream or ice cream drinks. Fats and Oils Gravy that has suet, meat fat, or shortening. Cocoa butter, hydrogenated oils, palm oil, coconut oil, palm kernel oil. These can often be found in baked products, candy, fried foods, nondairy creamers, and whipped toppings. Solid fats and shortenings, including bacon fat, salt pork, lard, and butter. Nondairy cream substitutes, such as coffee creamers and sour cream substitutes. Salad dressings that are made of unknown oils, cheese, or sour cream. The items listed above may not be a complete list of foods and beverages to avoid. Contact your dietitian for more information. This information is not intended to replace advice given to you by your health care provider. Make sure you discuss any questions you have with your health care provider. Document Released: 07/31/2011 Document Revised: 07/07/2015 Document Reviewed: 07/23/2013 Elsevier Interactive Patient Education  2018 [employer].   Osteoporosis Osteoporosis is the thinning and loss of density in the bones. Osteoporosis makes the bones more brittle, fragile, and likely to break (fracture). Over time, osteoporosis can cause the bones to become so weak that they fracture after a simple fall. The bones most likely to fracture are the bones in the hip, wrist, and spine. What are the causes? The exact cause is not known. What increases the risk? Anyone can develop osteoporosis. You may be at greater risk if you have a family history of the condition or have poor nutrition. You may also have  a higher risk if you are:  Female.  86 years old or older.  A smoker.  Not physically active.  White or Asian.  Slender.  What are the signs or symptoms? A fracture might be the first sign of the disease, especially if it results from a fall or injury that would not usually cause a bone to break. Other signs and  symptoms include:  Low back and neck pain.  Stooped posture.  Height loss.  How is this diagnosed? To make a diagnosis, your health care provider may:  Take a medical history.  Perform a physical exam.  Order tests, such as: ? A bone mineral density test. ? A dual-energy X-ray absorptiometry test.  How is this treated? The goal of osteoporosis treatment is to strengthen your bones to reduce your risk of a fracture. Treatment may involve:  Making lifestyle changes, such as: ? Eating a diet rich in calcium. ? Doing weight-bearing and muscle-strengthening exercises. ? Stopping tobacco use. ? Limiting alcohol intake.  Taking medicine to slow the process of bone loss or to increase bone density.  Monitoring your levels of calcium and vitamin D.  Follow these instructions at home:  Include calcium and vitamin D in your diet. Calcium is important for bone health, and vitamin D helps the body absorb calcium.  Perform weight-bearing and muscle-strengthening exercises as directed by your health care provider.  Do not use any tobacco products, including cigarettes, chewing tobacco, and electronic cigarettes. If you need help quitting, ask your health care provider.  Limit your alcohol intake.  Take medicines only as directed by your health care provider.  Keep all follow-up visits as directed by your health care provider. This is important.  Take precautions at home to lower your risk of falling, such as: ? Keeping rooms well lit and clutter free. ? Installing safety rails on stairs. ? Using rubber mats in the bathroom and other areas that are often wet or slippery. Get help right away if: You fall or injure yourself. This information is not intended to replace advice given to you by your health care provider. Make sure you discuss any questions you have with your health care provider. Document Released: 11/08/2004 Document Revised: 07/04/2015 Document Reviewed:  07/09/2013 Elsevier Interactive Patient Education  2018 [place] all medications as directed. Continue excellent hydration and regular walking. Try not to drink water too close yo bedtime and she is that reduces nighttime urination. Follow-up in 4 months, A1c re-check [place]!

## 2017-04-23 NOTE — Assessment & Plan Note (Signed)
>>  ASSESSMENT AND PLAN FOR BIPOLAR 1 DISORDER (HCC) WRITTEN ON 04/23/2017  1:15 PM BY DANFORD, KATY D, NP  Stable Denies of thoughts of harming herself/others Followed by Dr. Kapur/psychiatrist Q3Months Currently on Seroquel 50mg  QHS

## 2017-05-01 DIAGNOSIS — L400 Psoriasis vulgaris: Secondary | ICD-10-CM | POA: Diagnosis not present

## 2017-05-01 DIAGNOSIS — L304 Erythema intertrigo: Secondary | ICD-10-CM | POA: Diagnosis not present

## 2017-05-02 DIAGNOSIS — F3174 Bipolar disorder, in full remission, most recent episode manic: Secondary | ICD-10-CM | POA: Diagnosis not present

## 2017-05-02 DIAGNOSIS — F5105 Insomnia due to other mental disorder: Secondary | ICD-10-CM | POA: Diagnosis not present

## 2017-06-13 DIAGNOSIS — Z86010 Personal history of colonic polyps: Secondary | ICD-10-CM | POA: Diagnosis not present

## 2017-06-13 DIAGNOSIS — K7460 Unspecified cirrhosis of liver: Secondary | ICD-10-CM | POA: Diagnosis not present

## 2017-06-13 DIAGNOSIS — Z1211 Encounter for screening for malignant neoplasm of colon: Secondary | ICD-10-CM | POA: Diagnosis not present

## 2017-06-13 DIAGNOSIS — K5904 Chronic idiopathic constipation: Secondary | ICD-10-CM | POA: Diagnosis not present

## 2017-06-13 DIAGNOSIS — Z800 Family history of malignant neoplasm of digestive organs: Secondary | ICD-10-CM | POA: Diagnosis not present

## 2017-06-13 DIAGNOSIS — K8020 Calculus of gallbladder without cholecystitis without obstruction: Secondary | ICD-10-CM | POA: Diagnosis not present

## 2017-06-14 ENCOUNTER — Other Ambulatory Visit: Payer: Medicare Other

## 2017-06-14 ENCOUNTER — Other Ambulatory Visit: Payer: Self-pay

## 2017-06-14 DIAGNOSIS — E039 Hypothyroidism, unspecified: Secondary | ICD-10-CM

## 2017-06-15 LAB — T3: T3, Total: 125 ng/dL (ref 71–180)

## 2017-06-15 LAB — TSH: TSH: 0.036 u[IU]/mL — AB (ref 0.450–4.500)

## 2017-06-15 LAB — T4, FREE: Free T4: 1.41 ng/dL (ref 0.82–1.77)

## 2017-06-21 ENCOUNTER — Other Ambulatory Visit: Payer: Self-pay | Admitting: Adult Health

## 2017-06-21 DIAGNOSIS — Z139 Encounter for screening, unspecified: Secondary | ICD-10-CM

## 2017-06-24 ENCOUNTER — Other Ambulatory Visit: Payer: Self-pay | Admitting: Adult Health

## 2017-06-24 ENCOUNTER — Other Ambulatory Visit: Payer: Self-pay

## 2017-06-24 DIAGNOSIS — E039 Hypothyroidism, unspecified: Secondary | ICD-10-CM

## 2017-06-24 MED ORDER — LEVOTHYROXINE SODIUM 75 MCG PO TABS: 75.0000 ug | ORAL_TABLET | Freq: Every day | ORAL | 0 refills | Status: DC

## 2017-08-05 ENCOUNTER — Ambulatory Visit
Admission: RE | Admit: 2017-08-05 | Discharge: 2017-08-05 | Disposition: A | Discharge status: Home / Self Care | Payer: Medicare Other | Source: Ambulatory Visit | Attending: Adult Health | Admitting: Adult Health

## 2017-08-05 DIAGNOSIS — Z139 Encounter for screening, unspecified: Secondary | ICD-10-CM

## 2017-08-05 DIAGNOSIS — Z1231 Encounter for screening mammogram for malignant neoplasm of breast: Secondary | ICD-10-CM | POA: Diagnosis not present

## 2017-08-09 DIAGNOSIS — F5105 Insomnia due to other mental disorder: Secondary | ICD-10-CM | POA: Diagnosis not present

## 2017-08-09 DIAGNOSIS — F3174 Bipolar disorder, in full remission, most recent episode manic: Secondary | ICD-10-CM | POA: Diagnosis not present

## 2017-08-20 ENCOUNTER — Encounter: Payer: Self-pay | Admitting: Adult Health

## 2017-08-20 ENCOUNTER — Ambulatory Visit (INDEPENDENT_AMBULATORY_CARE_PROVIDER_SITE_OTHER): Payer: Medicare Other | Admitting: Adult Health

## 2017-08-20 VITALS — BP 106/62 | HR 61 | Ht 62.0 in | Wt 127.2 lb

## 2017-08-20 DIAGNOSIS — Z0000 Encounter for general adult medical examination without abnormal findings: Secondary | ICD-10-CM

## 2017-08-20 DIAGNOSIS — E039 Hypothyroidism, unspecified: Secondary | ICD-10-CM

## 2017-08-20 DIAGNOSIS — E119 Type 2 diabetes mellitus without complications: Secondary | ICD-10-CM | POA: Diagnosis not present

## 2017-08-20 DIAGNOSIS — E785 Hyperlipidemia, unspecified: Secondary | ICD-10-CM

## 2017-08-20 LAB — POCT GLYCOSYLATED HEMOGLOBIN (HGB A1C): Hemoglobin A1C: 5.8 % — AB (ref 4.0–5.6)

## 2017-08-20 NOTE — Assessment & Plan Note (Signed)
Will check LFTs at [facility] visit in 6 months

## 2017-08-20 NOTE — Progress Notes (Signed)
Subjective:    Patient ID: [name], female    DOB: [birth year], 72 y.o.   MRN: [id number]   04/23/17 OV: [name] presents to establish as a new pt.  She is a pleasant 72 year old female. PMH: Bipolar 1 dx'd in her 34s, acute encephalopathy-2016 (CVA ruled out, repeat MRI with contrast recommended in 01/2015, do not see imaging study in system), dx'd with diabetes 2016 with HHS/DKA, initial A1c - 12.8.  With diet/exercise she was able to stop injectable therapy and has not been on antidiabetic medication since 2017. Last A1c 6.3, drawn 03/2617 Parathyroidectomy-2014 Hx of osteoporosis Hx of Chronic diastolic dysfunction- she denies ever being on any cardiac medciations She reports medication compliance, denies SE She has been intolerant to statin therapy in past, re: myalgia's  She reports following diabetic diet and walks daily  08/20/17 OV: [name] presents for regular f/u: T2D, CHF, HLD, and Thyroid disorder She has dramatically improved her diet- eliminated fried food and soda She walks >1 mile/day and performs yard work most days of the week She estimates to drink >60 oz water/day and abstains from tobacco/ETOH use Overall she feels "just great" She reports alendronate (fosamax) 70mg  Qweek for "maybe 2-3 years".  Patient Care Team    Relationship Specialty Notifications Start End  [place], Valetta Fuller D, NP PCP - General Family Medicine  04/23/17   Juanita Craver, MD Consulting Physician Gastroenterology  04/23/17   Anda Kraft, MD Consulting Physician Endocrinology  04/23/17   Allyn Kenner, MD  Dermatology  04/23/17   Terrance Mass, MD  Gynecology  04/23/17   Kathie Rhodes, MD Consulting Physician Urology  04/23/17   Natara Monfort Apo, MD Consulting Physician Ophthalmology  04/23/17   Chauncey Mann, MD Referring Physician Psychiatry  04/23/17     Patient Active Problem List   Diagnosis Date Noted  . Diabetes mellitus due to non-steroid drug without complication ([place]) 25/85/2778   . Healthcare maintenance 04/23/2017  . Type 2 diabetes mellitus ([place]) 04/23/2017  . Hyperlipidemia 04/23/2017  . Decreased GFR 04/23/2017  . Elevated LFTs 04/23/2017  . Osteoporosis 08/23/2015  . Primary hyperparathyroidism ([place]) 03/27/2012  . Abdominal pain   . Hyponatremia 02/13/2012  . Chronic diastolic CHF (congestive heart failure) ([place]) 02/13/2012  . Hypothyroidism 02/11/2012  . CVA (cerebral infarction) 02/10/2012  . Ataxia 02/10/2012  . Bipolar 1 disorder ([place]) 02/10/2012  . Hypotension, postural 02/09/2012    Class: Acute  . Bipolar 1 disorder, manic, moderate ([place]) 02/05/2012     Past Medical History:  Diagnosis Date  . Bipolar disorder ([place])   . Chills without fever    intermittant  . Depression   . History of colon polyps    2009 TUBULAR ADENOMA  . History of concussion    02-05-2012--  fell and hit head (had previous falls prior to this day) dx mild concussion -- per MRI and neurologist note (dr Leonie Man)  right frontal lob w/ shear injuries consistant with fall/trauma  . History of Graves' disease    s/p  radiactive iodine therapy 1989  . History of primary hyperparathyroidism    W/  HYPERCALCEMIA--  s/p  left inferior parathyroidectomy 2014  . Hyperlipidemia   . Hypothyroidism, postradioiodine therapy 1989  approx   endocrinologist-  dr Gaylene Brooks [facility]  . Increased urinary frequency   . Osteoporosis   . Type 2 diabetes mellitus ([place])      Past Surgical History:  Procedure Laterality Date  . CYSTOSCOPY N/A  12/19/2015   Procedure: Rolly Salter UNDER ANESTHESIA;  Surgeon: Kathie Rhodes, MD;  Location: [facility];  Service: Urology;  Laterality: N/A;  . PARATHYROIDECTOMY Left 03/27/2012   Procedure: Left Inferior Mininally Invasive PARATHYROIDECTOMY;  Surgeon: Earnstine Regal, MD;  Location: [facility];  Service: General;  Laterality: Left;  Left Inferior Mininally Invasive Parathyroidectomy---  hypercellular adenoma  .  TRANSOBTURATOR SLING  09/01/2004  . transvaginal mesh placement    . TUBAL LIGATION  1978  . VAGINAL HYSTERECTOMY  1986    WITH OVARIAN PRESERVATION      Family History  Problem Relation Age of Onset  . Cancer Mother        MELANOMA  . Diabetes Maternal Aunt   . Heart attack Brother   . Diabetes Maternal Uncle      Social History   Substance and Sexual Activity  Drug Use No     Social History   Substance and Sexual Activity  Alcohol Use No  . Alcohol/week: 0.0 oz     Social History   Tobacco Use  Smoking Status Former Smoker  . Packs/day: 1.00  . Years: 30.00  . Pack years: 30.00  . Types: Cigarettes  . Last attempt to quit: 03/18/1997  . Years since quitting: 20.4  Smokeless Tobacco Never Used     Outpatient Encounter Medications as of 08/20/2017  Medication Sig  . acetaminophen (TYLENOL) 500 MG tablet Take 500 mg by mouth every 6 (six) hours as needed. For pain  . alendronate (FOSAMAX) 70 MG tablet Take 70 mg by mouth once a week. Take with a full glass of water on an empty stomach.  . calcipotriene-betamethasone (TACLONEX SCALP) external suspension Apply topically daily as needed.   . clobetasol cream (TEMOVATE) 1.02 % Apply 1 application topically 2 (two) times daily as needed.   . fluticasone (CUTIVATE) 0.05 % cream Apply topically 2 (two) times daily as needed.   Marland Kitchen FREESTYLE LITE test strip   . ketoconazole (NIZORAL) 2 % cream Apply 1 application topically daily as needed.   . Lancets (FREESTYLE) lancets   . levothyroxine (SYNTHROID, LEVOTHROID) 75 MCG tablet Take 1 tablet (75 mcg total) by mouth daily before breakfast.  . SEROQUEL 50 MG tablet Take 1 tablet by mouth at bedtime.   No facility-administered encounter medications on file as of 08/20/2017.     Allergies: Aspirin; Lipitor [atorvastatin calcium]; Trimethoprim; Vivelle [estradiol]; Zetia [ezetimibe]; Zocor [simvastatin]; and Sulfa antibiotics  Body mass index is 23.27 kg/m.  Blood  pressure 106/62, pulse 61, height 5\' 2"  (1.575 m), weight 127 lb 3.2 oz (57.7 kg), SpO2 98 %.  Review of Systems  Constitutional: Positive for fatigue. Negative for activity change, appetite change, chills, diaphoresis, fever and unexpected weight change.  Eyes: Negative for visual disturbance.  Respiratory: Negative for cough, chest tightness, shortness of breath, wheezing and stridor.   Cardiovascular: Negative for chest pain, palpitations and leg swelling.  Gastrointestinal: Negative for abdominal distention, abdominal pain, blood in stool, constipation, diarrhea, nausea and vomiting.  Genitourinary: Positive for frequency. Negative for difficulty urinating.  Skin: Negative for color change, pallor, rash and wound.  Neurological: Negative for dizziness and headaches.  Hematological: Does not bruise/bleed easily.  Psychiatric/Behavioral: Negative for dysphoric mood, hallucinations, self-injury, sleep disturbance and suicidal ideas. The patient is not nervous/anxious and is not hyperactive.        Objective:   Physical Exam  Constitutional: She appears well-developed and well-nourished. No distress.  HENT:  Head: Normocephalic and atraumatic.  Right Ear: External ear normal.  Left Ear: External ear normal.  Eyes: Pupils are equal, round, and reactive to light. Conjunctivae are normal.  Cardiovascular: Normal rate, regular rhythm, normal heart sounds and intact distal pulses.  Pulmonary/Chest: Effort normal and breath sounds normal. No respiratory distress. She has no wheezes. She has no rales. She exhibits no tenderness.  Neurological: She is alert.  Skin: She is not diaphoretic.      Assessment & Plan:   1. Type 2 diabetes mellitus without complication, without long-term current use of insulin ([place])   2. Hypothyroidism, unspecified type   3. Hyperlipidemia, unspecified hyperlipidemia type   4. Healthcare maintenance     Type 2 diabetes mellitus ([place]) Lab Results  Component  Value Date   HGBA1C 5.8 (A) 08/20/2017   HGBA1C 6.3 04/09/2017   HGBA1C 6.4 10/09/2016   She has lost >2 lbs since last OV 3/19 She eliminated saturated fat/sugar from diet   Hyperlipidemia Will check LFTs at [facility] visit in 6 months  Healthcare maintenance Continue all medications as directed. Continue your excellent water intake and Mediterranean diet. Your blood pressure, blood sugar, and weight all LOOK GREAT! We will call you when Thyroid panel results are available. Please schedule Medicare Wellness with fasting labs in 6 months. If you need medication refills prior to that, please call your pharmacy.  Hypothyroidism Thyroid panel checked today Currently on Levothyroxine 30mcg QD   FOLLOW-UP:  Return in about 6 months (around 02/20/2018) for Fasting Labs, Medical Wellness.

## 2017-08-20 NOTE — Assessment & Plan Note (Signed)
Continue all medications as directed. Continue your excellent water intake and Mediterranean diet. Your blood pressure, blood sugar, and weight all LOOK GREAT! We will call you when Thyroid panel results are available. Please schedule Medicare Wellness with fasting labs in 6 months. If you need medication refills prior to that, please call your pharmacy.

## 2017-08-20 NOTE — Assessment & Plan Note (Signed)
Lab Results  Component Value Date   HGBA1C 5.8 (A) 08/20/2017   HGBA1C 6.3 04/09/2017   HGBA1C 6.4 10/09/2016   She has lost >2 lbs since last OV 3/19 She eliminated saturated fat/sugar from diet

## 2017-08-20 NOTE — Assessment & Plan Note (Signed)
Thyroid panel checked today Currently on Levothyroxine 39mcg QD

## 2017-08-20 NOTE — Patient Instructions (Signed)
Mediterranean Diet A Mediterranean diet refers to food and lifestyle choices that are based on the traditions of countries located on the [place]. This way of eating has been shown to help prevent certain conditions and improve outcomes for people who have chronic diseases, like kidney disease and heart disease. What are tips for following this plan? Lifestyle  Cook and eat meals together with your family, when possible.  Drink enough fluid to keep your urine clear or pale yellow.  Be physically active every day. This includes: ? Aerobic exercise like running or swimming. ? Leisure activities like gardening, walking, or housework.  Get 7-8 hours of sleep each night.  If recommended by your health care provider, drink red wine in moderation. This means 1 glass a day for nonpregnant women and 2 glasses a day for men. A glass of wine equals 5 oz (150 mL). Reading food labels  Check the serving size of packaged foods. For foods such as rice and pasta, the serving size refers to the amount of cooked product, not dry.  Check the total fat in packaged foods. Avoid foods that have saturated fat or trans fats.  Check the ingredients list for added sugars, such as corn syrup. Shopping  At the grocery store, buy most of your food from the areas near the walls of the store. This includes: ? Fresh fruits and vegetables (produce). ? Grains, beans, nuts, and seeds. Some of these may be available in unpackaged forms or large amounts (in bulk). ? Fresh seafood. ? Poultry and eggs. ? Low-fat dairy products.  Buy whole ingredients instead of prepackaged foods.  Buy fresh fruits and vegetables in-season from local farmers markets.  Buy frozen fruits and vegetables in resealable bags.  If you do not have access to quality fresh seafood, buy precooked frozen shrimp or canned fish, such as tuna, salmon, or sardines.  Buy small amounts of raw or cooked vegetables, salads, or olives from the  deli or salad bar at your store.  Stock your pantry so you always have certain foods on hand, such as olive oil, canned tuna, canned tomatoes, rice, pasta, and beans. Cooking  Cook foods with extra-virgin olive oil instead of using butter or other vegetable oils.  Have meat as a side dish, and have vegetables or grains as your main dish. This means having meat in small portions or adding small amounts of meat to foods like pasta or stew.  Use beans or vegetables instead of meat in common dishes like chili or lasagna.  Experiment with different cooking methods. Try roasting or broiling vegetables instead of steaming or sauteing them.  Add frozen vegetables to soups, stews, pasta, or rice.  Add nuts or seeds for added healthy fat at each meal. You can add these to yogurt, salads, or vegetable dishes.  Marinate fish or vegetables using olive oil, lemon juice, garlic, and fresh herbs. Meal planning  Plan to eat 1 vegetarian meal one day each week. Try to work up to 2 vegetarian meals, if possible.  Eat seafood 2 or more times a week.  Have healthy snacks readily available, such as: ? Vegetable sticks with hummus. ? Greek yogurt. ? Fruit and nut trail mix.  Eat balanced meals throughout the week. This includes: ? Fruit: 2-3 servings a day ? Vegetables: 4-5 servings a day ? Low-fat dairy: 2 servings a day ? Fish, poultry, or lean meat: 1 serving a day ? Beans and legumes: 2 or more servings a week ? Nuts   and seeds: 1-2 servings a day ? Whole grains: 6-8 servings a day ? Extra-virgin olive oil: 3-4 servings a day  Limit red meat and sweets to only a few servings a month What are my food choices?  Mediterranean diet ? Recommended ? Grains: Whole-grain pasta. Brown rice. Bulgar wheat. Polenta. Couscous. Whole-wheat bread. [place]. ? Vegetables: Artichokes. Beets. Broccoli. Cabbage. Carrots. Eggplant. Green beans. Chard. Kale. Spinach. Onions. Leeks. Peas. Squash.  Tomatoes. Peppers. Radishes. ? Fruits: Apples. Apricots. Avocado. Berries. Bananas. Cherries. Dates. Figs. Grapes. Lemons. Melon. Oranges. Peaches. Plums. Pomegranate. ? Meats and other protein foods: Beans. Almonds. Sunflower seeds. Pine nuts. Peanuts. [place]. Salmon. Scallops. Shrimp. [place]. Tilapia. Clams. Oysters. Eggs. ? Dairy: Low-fat milk. Cheese. Greek yogurt. ? Beverages: Water. Red wine. Herbal tea. ? Fats and oils: Extra virgin olive oil. Avocado oil. Grape seed oil. ? Sweets and desserts: [place] yogurt with honey. Baked apples. Poached pears. Trail mix. ? Seasoning and other foods: Basil. Cilantro. Coriander. Cumin. Mint. Parsley. Sage. Rosemary. Tarragon. Garlic. Oregano. Thyme. Pepper. Balsalmic vinegar. Tahini. Hummus. Tomato sauce. Olives. Mushrooms. ? Limit these ? Grains: Prepackaged pasta or rice dishes. Prepackaged cereal with added sugar. ? Vegetables: Deep fried potatoes (french fries). ? Fruits: Fruit canned in syrup. ? Meats and other protein foods: Beef. Pork. Lamb. Poultry with skin. Hot dogs. Bernie[place] Salines. ? Dairy: Ice cream. Sour cream. Whole milk. ? Beverages: Juice. Sugar-sweetened soft drinks. Beer. Liquor and spirits. ? Fats and oils: Butter. Canola oil. Vegetable oil. Beef fat (tallow). Lard. ? Sweets and desserts: Cookies. Cakes. Pies. Candy. ? Seasoning and other foods: Mayonnaise. Premade sauces and marinades. ? The items listed may not be a complete list. Talk with your dietitian about what dietary choices are right for you. Summary  The Mediterranean diet includes both food and lifestyle choices.  Eat a variety of fresh fruits and vegetables, beans, nuts, seeds, and whole grains.  Limit the amount of red meat and sweets that you eat.  Talk with your health care provider about whether it is safe for you to drink red wine in moderation. This means 1 glass a day for nonpregnant women and 2 glasses a day for men. A glass of wine equals 5 oz (150 mL). This information  is not intended to replace advice given to you by your health care provider. Make sure you discuss any questions you have with your health care provider. Document Released: 09/22/2015 Document Revised: 10/25/2015 Document Reviewed: 09/22/2015 Elsevier Interactive Patient Education  2018 [place] all medications as directed. Continue your excellent water intake and Mediterranean diet. Your blood pressure, blood sugar, and weight all LOOK GREAT! We will call you when Thyroid panel results are available. Please schedule Medicare Wellness with fasting labs in 6 months. If you need medication refills prior to that, please call your pharmacy. NICE TO SEE YOU!

## 2017-08-21 LAB — TSH: TSH: 0.213 u[IU]/mL — ABNORMAL LOW (ref 0.450–4.500)

## 2017-08-22 ENCOUNTER — Other Ambulatory Visit: Payer: Self-pay | Admitting: Adult Health

## 2017-08-22 DIAGNOSIS — E039 Hypothyroidism, unspecified: Secondary | ICD-10-CM

## 2017-08-22 MED ORDER — LEVOTHYROXINE SODIUM 50 MCG PO TABS: 50.0000 ug | ORAL_TABLET | Freq: Every day | ORAL | 0 refills | Status: DC

## 2017-08-30 ENCOUNTER — Encounter: Payer: Self-pay | Admitting: Adult Health

## 2017-09-02 ENCOUNTER — Other Ambulatory Visit: Payer: Self-pay | Admitting: Adult Health

## 2017-09-03 ENCOUNTER — Other Ambulatory Visit: Payer: Self-pay

## 2017-09-03 DIAGNOSIS — M810 Age-related osteoporosis without current pathological fracture: Secondary | ICD-10-CM

## 2017-09-04 ENCOUNTER — Other Ambulatory Visit: Payer: Self-pay | Admitting: Adult Health

## 2017-09-05 ENCOUNTER — Other Ambulatory Visit: Payer: Self-pay | Admitting: Adult Health

## 2017-09-05 NOTE — Progress Notes (Signed)
MyChart message sent to pt with recommendations.  T. Luva Metzger, CMA  

## 2017-09-05 NOTE — Progress Notes (Signed)
Good Morning Tonya, Can you please ask Ms. Arauz share- Then lets try switching her from Alendronate 70 mg once weekly to Alendronate 10mg  QD- to see is she tolerates med better. Thanks! Valetta Fuller

## 2017-09-05 NOTE — Progress Notes (Unsigned)
DEXA was ordered, however, since pt had one in 2018, insurance will not pay for another one until 2021.  Charyl Bigger, CMA

## 2017-10-01 ENCOUNTER — Other Ambulatory Visit (INDEPENDENT_AMBULATORY_CARE_PROVIDER_SITE_OTHER): Payer: Medicare Other

## 2017-10-01 DIAGNOSIS — E039 Hypothyroidism, unspecified: Secondary | ICD-10-CM | POA: Diagnosis not present

## 2017-10-02 ENCOUNTER — Other Ambulatory Visit: Payer: Self-pay | Admitting: Adult Health

## 2017-10-02 LAB — TSH: TSH: 7.44 u[IU]/mL — ABNORMAL HIGH (ref 0.450–4.500)

## 2017-10-03 ENCOUNTER — Other Ambulatory Visit: Payer: Self-pay | Admitting: Adult Health

## 2017-10-03 ENCOUNTER — Encounter: Payer: Self-pay | Admitting: Adult Health

## 2017-10-03 DIAGNOSIS — I951 Orthostatic hypotension: Secondary | ICD-10-CM

## 2017-10-03 DIAGNOSIS — E039 Hypothyroidism, unspecified: Secondary | ICD-10-CM

## 2017-10-03 MED ORDER — LEVOTHYROXINE SODIUM 25 MCG PO TABS: ORAL_TABLET | ORAL | 0 refills | Status: DC

## 2017-11-06 DIAGNOSIS — K5730 Diverticulosis of large intestine without perforation or abscess without bleeding: Secondary | ICD-10-CM | POA: Diagnosis not present

## 2017-11-06 DIAGNOSIS — Z1211 Encounter for screening for malignant neoplasm of colon: Secondary | ICD-10-CM | POA: Diagnosis not present

## 2017-11-06 DIAGNOSIS — Z86010 Personal history of colonic polyps: Secondary | ICD-10-CM | POA: Diagnosis not present

## 2017-11-06 DIAGNOSIS — K635 Polyp of colon: Secondary | ICD-10-CM | POA: Diagnosis not present

## 2017-11-06 DIAGNOSIS — D125 Benign neoplasm of sigmoid colon: Secondary | ICD-10-CM | POA: Diagnosis not present

## 2017-11-06 LAB — HM COLONOSCOPY

## 2017-11-07 ENCOUNTER — Other Ambulatory Visit: Payer: Self-pay

## 2017-11-07 ENCOUNTER — Encounter: Payer: Self-pay | Admitting: Adult Health

## 2017-11-07 MED ORDER — LEVOTHYROXINE SODIUM 50 MCG PO TABS: 50.0000 ug | ORAL_TABLET | Freq: Every day | ORAL | 0 refills | Status: DC

## 2017-11-13 ENCOUNTER — Encounter: Payer: Self-pay | Admitting: Adult Health

## 2017-11-22 ENCOUNTER — Encounter: Payer: Self-pay | Admitting: Adult Health

## 2017-11-25 ENCOUNTER — Other Ambulatory Visit (INDEPENDENT_AMBULATORY_CARE_PROVIDER_SITE_OTHER): Payer: Medicare Other

## 2017-11-25 ENCOUNTER — Other Ambulatory Visit: Payer: Self-pay

## 2017-11-25 DIAGNOSIS — E039 Hypothyroidism, unspecified: Secondary | ICD-10-CM | POA: Diagnosis not present

## 2017-11-25 DIAGNOSIS — E079 Disorder of thyroid, unspecified: Secondary | ICD-10-CM | POA: Insufficient documentation

## 2017-11-25 NOTE — Progress Notes (Signed)
Opened in error. T. Nelson, CMA 

## 2017-11-26 ENCOUNTER — Other Ambulatory Visit: Payer: Self-pay

## 2017-11-26 LAB — TSH: TSH: 3.26 u[IU]/mL (ref 0.450–4.500)

## 2017-11-26 MED ORDER — LEVOTHYROXINE SODIUM 25 MCG PO TABS: ORAL_TABLET | ORAL | 1 refills | Status: DC

## 2017-11-26 MED ORDER — LEVOTHYROXINE SODIUM 50 MCG PO TABS: 50.0000 ug | ORAL_TABLET | Freq: Every day | ORAL | 1 refills | Status: DC

## 2017-11-27 DIAGNOSIS — Z79899 Other long term (current) drug therapy: Secondary | ICD-10-CM | POA: Diagnosis not present

## 2017-11-27 DIAGNOSIS — F5105 Insomnia due to other mental disorder: Secondary | ICD-10-CM | POA: Diagnosis not present

## 2017-11-27 DIAGNOSIS — F3174 Bipolar disorder, in full remission, most recent episode manic: Secondary | ICD-10-CM | POA: Diagnosis not present

## 2017-11-28 ENCOUNTER — Other Ambulatory Visit: Payer: Self-pay | Admitting: Adult Health

## 2017-12-11 DIAGNOSIS — F3174 Bipolar disorder, in full remission, most recent episode manic: Secondary | ICD-10-CM | POA: Diagnosis not present

## 2017-12-11 DIAGNOSIS — Z79899 Other long term (current) drug therapy: Secondary | ICD-10-CM | POA: Diagnosis not present

## 2017-12-18 ENCOUNTER — Ambulatory Visit
Admission: RE | Admit: 2017-12-18 | Discharge: 2017-12-18 | Disposition: A | Discharge status: Home / Self Care | Payer: Medicare Other | Source: Ambulatory Visit | Attending: Adult Health | Admitting: Adult Health

## 2017-12-18 DIAGNOSIS — Z780 Asymptomatic menopausal state: Secondary | ICD-10-CM | POA: Diagnosis not present

## 2017-12-18 DIAGNOSIS — M810 Age-related osteoporosis without current pathological fracture: Secondary | ICD-10-CM

## 2017-12-18 DIAGNOSIS — M85851 Other specified disorders of bone density and structure, right thigh: Secondary | ICD-10-CM | POA: Diagnosis not present

## 2017-12-23 NOTE — Progress Notes (Signed)
Subjective:    Patient ID: [name], female    DOB: 05-24-1945, 72 y.o.   MRN: [id number]  HPI:  [name] is here to discuss recent DEXA scan results 12/18/17- ASSESSMENT: The BMD measured at Femur Total Left is 0.837 g/cm2 with a T-score of -1.4. This patient is considered OSTEOPENIC according to [facility]) criteria. The scan quality is good.  She has been on Alendronate (Fosamax) 70mg  once week for 4.5 years She is not currently on multi-vitamin She estimates to walk 1 mile/day 3 times week She follows a heart healthy diet and denies tobacco/vape/ETOH use  Patient Care Team    Relationship Specialty Notifications Start End  Mina Marble D, NP PCP - General Family Medicine  04/23/17   Juanita Craver, MD Consulting Physician Gastroenterology  04/23/17   Anda Kraft, MD Consulting Physician Endocrinology  04/23/17   Allyn Kenner, MD  Dermatology  04/23/17   Terrance Mass, MD  Gynecology  04/23/17   Kathie Rhodes, MD Consulting Physician Urology  04/23/17   Sanja Elizardo Apo, MD Consulting Physician Ophthalmology  04/23/17   Chauncey Mann, MD Referring Physician Psychiatry  04/23/17     Patient Active Problem List   Diagnosis Date Noted  . Disease of thyroid gland 11/25/2017  . Diabetes mellitus due to non-steroid drug without complication ([place]) 24/58/0998  . Healthcare maintenance 04/23/2017  . Type 2 diabetes mellitus ([place]) 04/23/2017  . Hyperlipidemia 04/23/2017  . Decreased GFR 04/23/2017  . Elevated LFTs 04/23/2017  . Osteoporosis 08/23/2015  . Primary hyperparathyroidism ([place]) 03/27/2012  . Abdominal pain   . Hyponatremia 02/13/2012  . Chronic diastolic CHF (congestive heart failure) ([place]) 02/13/2012  . Hypothyroidism 02/11/2012  . CVA (cerebral infarction) 02/10/2012  . Ataxia 02/10/2012  . Bipolar 1 disorder ([place]) 02/10/2012  . Hypotension, postural 02/09/2012    Class: Acute  . Bipolar 1 disorder, manic, moderate ([place]) 02/05/2012      Past Medical History:  Diagnosis Date  . Bipolar disorder ([place])   . Chills without fever    intermittant  . Depression   . History of colon polyps    2009 TUBULAR ADENOMA  . History of concussion    02-05-2012--  fell and hit head (had previous falls prior to this day) dx mild concussion -- per MRI and neurologist note (dr Leonie Man)  right frontal lob w/ shear injuries consistant with fall/trauma  . History of Graves' disease    s/p  radiactive iodine therapy 1989  . History of primary hyperparathyroidism    W/  HYPERCALCEMIA--  s/p  left inferior parathyroidectomy 2014  . Hyperlipidemia   . Hypothyroidism, postradioiodine therapy 1989  approx   endocrinologist-  dr Gaylene Brooks [facility]  . Increased urinary frequency   . Osteoporosis   . Type 2 diabetes mellitus ([place])      Past Surgical History:  Procedure Laterality Date  . CYSTOSCOPY N/A 12/19/2015   Procedure: Rolly Salter UNDER ANESTHESIA;  Surgeon: Kathie Rhodes, MD;  Location: [facility] [address];  Service: Urology;  Laterality: N/A;  . PARATHYROIDECTOMY Left 03/27/2012   Procedure: Left Inferior Mininally Invasive PARATHYROIDECTOMY;  Surgeon: Earnstine Regal, MD;  Location: [facility];  Service: General;  Laterality: Left;  Left Inferior Mininally Invasive Parathyroidectomy---  hypercellular adenoma  . TRANSOBTURATOR SLING  09/01/2004  . transvaginal mesh placement    . TUBAL LIGATION  1978  . VAGINAL HYSTERECTOMY  1986    WITH OVARIAN PRESERVATION  Family History  Problem Relation Age of Onset  . Cancer Mother        MELANOMA  . Diabetes Maternal Aunt   . Heart attack Brother   . Diabetes Maternal Uncle      Social History   Substance and Sexual Activity  Drug Use No     Social History   Substance and Sexual Activity  Alcohol Use No  . Alcohol/week: 0.0 standard drinks     Social History   Tobacco Use  Smoking Status Former Smoker  . Packs/day: 1.00  . Years: 30.00   . Pack years: 30.00  . Types: Cigarettes  . Last attempt to quit: 03/18/1997  . Years since quitting: 20.7  Smokeless Tobacco Never Used     Outpatient Encounter Medications as of 12/24/2017  Medication Sig  . acetaminophen (TYLENOL) 500 MG tablet Take 500 mg by mouth every 6 (six) hours as needed. For pain  . alendronate (FOSAMAX) 70 MG tablet Take 70 mg by mouth once a week. Take with a full glass of water on an empty stomach.  . calcipotriene-betamethasone (TACLONEX SCALP) external suspension Apply topically daily as needed.   . clobetasol cream (TEMOVATE) 1.76 % Apply 1 application topically 2 (two) times daily as needed.   . fluticasone (CUTIVATE) 0.05 % cream Apply topically 2 (two) times daily as needed.   Marland Kitchen FREESTYLE LITE test strip   . ketoconazole (NIZORAL) 2 % cream Apply 1 application topically daily as needed.   . Lancets (FREESTYLE) lancets   . levothyroxine (SYNTHROID, LEVOTHROID) 25 MCG tablet 1/2 tablet daily, to be taken with levothyroxine 20mcg tablet daily= 62.65mcg daily  . levothyroxine (SYNTHROID, LEVOTHROID) 50 MCG tablet Take 1 tablet (50 mcg total) by mouth daily before breakfast.  . SEROQUEL 50 MG tablet Take 1 tablet by mouth at bedtime.   No facility-administered encounter medications on file as of 12/24/2017.     Allergies: Aspirin; Lipitor [atorvastatin calcium]; Trimethoprim; Vivelle [estradiol]; Zetia [ezetimibe]; Zocor [simvastatin]; and Sulfa antibiotics  Body mass index is 24.27 kg/m.  Blood pressure (!) 94/58, pulse (!) 59, temperature 98.2 F (36.8 C), temperature source Oral, height 5\' 2"  (1.575 m), weight 132 lb 11.2 oz (60.2 kg), SpO2 98 %. Review of Systems  Constitutional: Positive for fatigue. Negative for activity change, appetite change, chills, diaphoresis, fever and unexpected weight change.  Eyes: Negative for visual disturbance.  Respiratory: Negative for cough, chest tightness, shortness of breath, wheezing and stridor.    Cardiovascular: Negative for chest pain, palpitations and leg swelling.  Musculoskeletal: Negative for arthralgias, back pain, gait problem, joint swelling, myalgias, neck pain and neck stiffness.  Neurological: Negative for dizziness, weakness and headaches.  Hematological: Does not bruise/bleed easily.  Psychiatric/Behavioral: Negative for behavioral problems, confusion, decreased concentration, dysphoric mood, hallucinations, self-injury, sleep disturbance and suicidal ideas. The patient is not nervous/anxious and is not hyperactive.        Objective:   Physical Exam  Constitutional: She is oriented to person, place, and time. She appears well-developed and well-nourished. No distress.  HENT:  Head: Normocephalic and atraumatic.  Right Ear: External ear normal.  Left Ear: External ear normal.  Nose: Nose normal.  Mouth/Throat: Oropharynx is clear and moist.  Cardiovascular: Normal rate, regular rhythm, normal heart sounds and intact distal pulses.  No murmur heard. Pulmonary/Chest: Effort normal and breath sounds normal. No stridor. No respiratory distress. She has no wheezes. She has no rales. She exhibits no tenderness.  Neurological: She is alert and oriented to  person, place, and time.  Skin: Skin is warm and dry. Capillary refill takes less than 2 seconds. She is not diaphoretic.  Psychiatric: She has a normal mood and affect. Her behavior is normal. Judgment and thought content normal.  Nursing note and vitals reviewed.     Assessment & Plan:   1. Age-related osteoporosis without current pathological fracture     Osteoporosis Complete home supply of Alendronate (Fosamax) 70mg  once weekly, then stop this medication.-  Will have been on RX for 5 year course DEXA scan- osteoporosis score is stable, no history of falls/fractures. Start once daily women's multi-vitamin. Increase regular walking- strive to walk 1.5 miles every day. Adequate calcium, vit d, and weight bearing  exercise can be very successful in treating osteoporosis. Please keep Medicare Wellness visit in Jan. Continue to drink plenty of water and eat a heart health diet.    FOLLOW-UP:  Return in about 2 months (around 02/23/2018) for Medical Wellness.

## 2017-12-24 ENCOUNTER — Ambulatory Visit (INDEPENDENT_AMBULATORY_CARE_PROVIDER_SITE_OTHER): Payer: Medicare Other | Admitting: Adult Health

## 2017-12-24 ENCOUNTER — Encounter: Payer: Self-pay | Admitting: Adult Health

## 2017-12-24 VITALS — BP 94/58 | HR 59 | Temp 98.2°F | Ht 62.0 in | Wt 132.7 lb

## 2017-12-24 DIAGNOSIS — M810 Age-related osteoporosis without current pathological fracture: Secondary | ICD-10-CM | POA: Diagnosis not present

## 2017-12-24 NOTE — Patient Instructions (Addendum)
Osteoporosis Osteoporosis is the thinning and loss of density in the bones. Osteoporosis makes the bones more brittle, fragile, and likely to break (fracture). Over time, osteoporosis can cause the bones to become so weak that they fracture after a simple fall. The bones most likely to fracture are the bones in the hip, wrist, and spine. What are the causes? The exact cause is not known. What increases the risk? Anyone can develop osteoporosis. You may be at greater risk if you have a family history of the condition or have poor nutrition. You may also have a higher risk if you are:  Female.  28 years old or older.  A smoker.  Not physically active.  White or Asian.  Slender.  What are the signs or symptoms? A fracture might be the first sign of the disease, especially if it results from a fall or injury that would not usually cause a bone to break. Other signs and symptoms include:  Low back and neck pain.  Stooped posture.  Height loss.  How is this diagnosed? To make a diagnosis, your health care provider may:  Take a medical history.  Perform a physical exam.  Order tests, such as: ? A bone mineral density test. ? A dual-energy X-ray absorptiometry test.  How is this treated? The goal of osteoporosis treatment is to strengthen your bones to reduce your risk of a fracture. Treatment may involve:  Making lifestyle changes, such as: ? Eating a diet rich in calcium. ? Doing weight-bearing and muscle-strengthening exercises. ? Stopping tobacco use. ? Limiting alcohol intake.  Taking medicine to slow the process of bone loss or to increase bone density.  Monitoring your levels of calcium and vitamin D.  Follow these instructions at home:  Include calcium and vitamin D in your diet. Calcium is important for bone health, and vitamin D helps the body absorb calcium.  Perform weight-bearing and muscle-strengthening exercises as directed by your health care  provider.  Do not use any tobacco products, including cigarettes, chewing tobacco, and electronic cigarettes. If you need help quitting, ask your health care provider.  Limit your alcohol intake.  Take medicines only as directed by your health care provider.  Keep all follow-up visits as directed by your health care provider. This is important.  Take precautions at home to lower your risk of falling, such as: ? Keeping rooms well lit and clutter free. ? Installing safety rails on stairs. ? Using rubber mats in the bathroom and other areas that are often wet or slippery. Get help right away if: You fall or injure yourself. This information is not intended to replace advice given to you by your health care provider. Make sure you discuss any questions you have with your health care provider. Document Released: 11/08/2004 Document Revised: 07/04/2015 Document Reviewed: 07/09/2013 Elsevier Interactive Patient Education  2018 [employer].  Complete home supply of Alendronate (Fosamax) 70mg  once weekly, then stop this medication. DEXA scan- osteoporosis score is stable, no history of falls/fractures. Start once daily women's multi-vitamin. Increase regular walking- strive to walk 1.5 miles every day. Adequate calcium, vit d, and weight bearing exercise can be very successful in treating osteoporosis. Please keep Medicare Wellness visit in Jan. Continue to drink plenty of water and eat a heart health diet. NICE TO SEE YOU!

## 2017-12-24 NOTE — Assessment & Plan Note (Addendum)
Complete home supply of Alendronate (Fosamax) 70mg  once weekly, then stop this medication.-  Will have been on RX for 5 year course DEXA scan- osteoporosis score is stable, no history of falls/fractures. Start once daily women's multi-vitamin. Increase regular walking- strive to walk 1.5 miles every day. Adequate calcium, vit d, and weight bearing exercise can be very successful in treating osteoporosis. Please keep Medicare Wellness visit in Jan. Continue to drink plenty of water and eat a heart health diet.

## 2018-03-05 ENCOUNTER — Other Ambulatory Visit: Payer: Self-pay

## 2018-03-05 DIAGNOSIS — E099 Drug or chemical induced diabetes mellitus without complications: Secondary | ICD-10-CM

## 2018-03-05 DIAGNOSIS — T50905A Adverse effect of unspecified drugs, medicaments and biological substances, initial encounter: Secondary | ICD-10-CM

## 2018-03-05 DIAGNOSIS — E871 Hypo-osmolality and hyponatremia: Secondary | ICD-10-CM

## 2018-03-05 DIAGNOSIS — R945 Abnormal results of liver function studies: Secondary | ICD-10-CM

## 2018-03-05 DIAGNOSIS — R7989 Other specified abnormal findings of blood chemistry: Secondary | ICD-10-CM

## 2018-03-05 DIAGNOSIS — R5383 Other fatigue: Secondary | ICD-10-CM

## 2018-03-05 DIAGNOSIS — E039 Hypothyroidism, unspecified: Secondary | ICD-10-CM

## 2018-03-05 DIAGNOSIS — E785 Hyperlipidemia, unspecified: Secondary | ICD-10-CM

## 2018-03-05 DIAGNOSIS — Z0000 Encounter for general adult medical examination without abnormal findings: Secondary | ICD-10-CM

## 2018-03-05 DIAGNOSIS — T39395A Adverse effect of other nonsteroidal anti-inflammatory drugs [NSAID], initial encounter: Secondary | ICD-10-CM

## 2018-03-07 DIAGNOSIS — Z961 Presence of intraocular lens: Secondary | ICD-10-CM | POA: Diagnosis not present

## 2018-03-07 DIAGNOSIS — E119 Type 2 diabetes mellitus without complications: Secondary | ICD-10-CM | POA: Diagnosis not present

## 2018-03-07 LAB — HM DIABETES EYE EXAM

## 2018-03-11 ENCOUNTER — Ambulatory Visit (INDEPENDENT_AMBULATORY_CARE_PROVIDER_SITE_OTHER): Payer: Medicare Other | Admitting: Adult Health

## 2018-03-11 ENCOUNTER — Encounter: Payer: Self-pay | Admitting: Adult Health

## 2018-03-11 VITALS — BP 103/62 | HR 71 | Temp 98.3°F | Ht 61.5 in | Wt 131.8 lb

## 2018-03-11 DIAGNOSIS — E871 Hypo-osmolality and hyponatremia: Secondary | ICD-10-CM

## 2018-03-11 DIAGNOSIS — E785 Hyperlipidemia, unspecified: Secondary | ICD-10-CM

## 2018-03-11 DIAGNOSIS — E039 Hypothyroidism, unspecified: Secondary | ICD-10-CM

## 2018-03-11 DIAGNOSIS — Z0000 Encounter for general adult medical examination without abnormal findings: Secondary | ICD-10-CM

## 2018-03-11 DIAGNOSIS — Z114 Encounter for screening for human immunodeficiency virus [HIV]: Secondary | ICD-10-CM | POA: Diagnosis not present

## 2018-03-11 DIAGNOSIS — E119 Type 2 diabetes mellitus without complications: Secondary | ICD-10-CM | POA: Diagnosis not present

## 2018-03-11 DIAGNOSIS — R7989 Other specified abnormal findings of blood chemistry: Secondary | ICD-10-CM

## 2018-03-11 DIAGNOSIS — T50905A Adverse effect of unspecified drugs, medicaments and biological substances, initial encounter: Secondary | ICD-10-CM | POA: Diagnosis not present

## 2018-03-11 DIAGNOSIS — R945 Abnormal results of liver function studies: Secondary | ICD-10-CM | POA: Diagnosis not present

## 2018-03-11 DIAGNOSIS — R5383 Other fatigue: Secondary | ICD-10-CM

## 2018-03-11 DIAGNOSIS — Z1159 Encounter for screening for other viral diseases: Secondary | ICD-10-CM

## 2018-03-11 DIAGNOSIS — E099 Drug or chemical induced diabetes mellitus without complications: Secondary | ICD-10-CM

## 2018-03-11 DIAGNOSIS — T39395A Adverse effect of other nonsteroidal anti-inflammatory drugs [NSAID], initial encounter: Secondary | ICD-10-CM

## 2018-03-11 LAB — POCT UA - MICROALBUMIN
Creatinine, POC: 100 mg/dL
MICROALBUMIN (UR) POC: 80 mg/L

## 2018-03-11 LAB — POCT GLYCOSYLATED HEMOGLOBIN (HGB A1C): HEMOGLOBIN A1C: 6.2 % — AB (ref 4.0–5.6)

## 2018-03-11 NOTE — Assessment & Plan Note (Signed)
Lab Results  Component Value Date   HGBA1C 6.2 (A) 03/11/2018   HGBA1C 5.8 (A) 08/20/2017   HGBA1C 6.3 04/09/2017   Microalbumin- abnormal BP too low to safely start on ACE/ARB

## 2018-03-11 NOTE — Progress Notes (Signed)
Subjective:   [name] is a 73 y.o. female who presents for Medicare Annual (Subsequent) preventive examination.  Review of Systems:  .Review of Systems: General:   No F/C, wt loss Pulm:   No DIB, SOB, pleuritic chest pain Card:  No CP, palpitations Abd:  No n/v/d or pain Ext:  No inc edema from baseline       Objective:     Vitals: BP 103/62   Pulse 71   Temp 98.3 F (36.8 C) (Oral)   Ht 5' 1.5" (1.562 m)   Wt 131 lb 12.8 oz (59.8 kg)   BMI 24.50 kg/m   Body mass index is 24.5 kg/m.  Advanced Directives 04/23/2017 12/19/2015 03/24/2012 02/10/2012  Does Patient Have a Medical Advance Directive? Yes Yes Patient does not have advance directive;Patient would not like information Patient has advance directive, copy not in chart  Type of Advance Directive Living will Living will;Healthcare Power of [place] will  Copy of [place] in Chart? - - - Copy requested from family  Pre-existing out of facility DNR order (yellow form or pink MOST form) - - - No  Some encounter information is confidential and restricted. Go to Review Flowsheets activity to see all data.    Tobacco Social History   Tobacco Use  Smoking Status Former Smoker  . Packs/day: 1.00  . Years: 30.00  . Pack years: 30.00  . Types: Cigarettes  . Last attempt to quit: 03/18/1997  . Years since quitting: 20.9  Smokeless Tobacco Never Used     Counseling given: Not Answered   Clinical Intake:                       Past Medical History:  Diagnosis Date  . Bipolar disorder ([place])   . Chills without fever    intermittant  . Depression   . History of colon polyps    2009 TUBULAR ADENOMA  . History of concussion    02-05-2012--  fell and hit head (had previous falls prior to this day) dx mild concussion -- per MRI and neurologist note (dr Leonie Man)  right frontal lob w/ shear injuries consistant with fall/trauma  . History of Graves' disease    s/p  radiactive  iodine therapy 1989  . History of primary hyperparathyroidism    W/  HYPERCALCEMIA--  s/p  left inferior parathyroidectomy 2014  . Hyperlipidemia   . Hypothyroidism, postradioiodine therapy 1989  approx   endocrinologist-  dr Gaylene Brooks [facility]  . Increased urinary frequency   . Osteoporosis   . Type 2 diabetes mellitus ([place])    Past Surgical History:  Procedure Laterality Date  . CYSTOSCOPY N/A 12/19/2015   Procedure: Rolly Salter UNDER ANESTHESIA;  Surgeon: Kathie Rhodes, MD;  [facility];  Service: Urology;  Laterality: N/A;  . PARATHYROIDECTOMY Left 03/27/2012   Procedure: Left Inferior Mininally Invasive PARATHYROIDECTOMY;  Surgeon: Earnstine Regal, MD;  Location: [facility];  Service: General;  Laterality: Left;  Left Inferior Mininally Invasive Parathyroidectomy---  hypercellular adenoma  . TRANSOBTURATOR SLING  09/01/2004  . transvaginal mesh placement    . TUBAL LIGATION  1978  . VAGINAL HYSTERECTOMY  1986    WITH OVARIAN PRESERVATION    Family History  Problem Relation Age of Onset  . Cancer Mother        MELANOMA  . Diabetes Maternal Aunt   . Heart attack Brother   . Diabetes  Maternal Uncle    Social History   Socioeconomic History  . Marital status: Married    Spouse name: Not on file  . Number of children: Not on file  . Years of education: Not on file  . Highest education level: Not on file  Occupational History  . Not on file  Social Needs  . Financial resource strain: Not on file  . Food insecurity:    Worry: Not on file    Inability: Not on file  . Transportation needs:    Medical: Not on file    Non-medical: Not on file  Tobacco Use  . Smoking status: Former Smoker    Packs/day: 1.00    Years: 30.00    Pack years: 30.00    Types: Cigarettes    Last attempt to quit: 03/18/1997    Years since quitting: 20.9  . Smokeless tobacco: Never Used  Substance and Sexual Activity  . Alcohol use: No    Alcohol/week: 0.0  standard drinks  . Drug use: No  . Sexual activity: Not Currently    Birth control/protection: Surgical  Lifestyle  . Physical activity:    Days per week: Not on file    Minutes per session: Not on file  . Stress: Not on file  Relationships  . Social connections:    Talks on phone: Not on file    Gets together: Not on file    Attends religious service: Not on file    Active member of club or organization: Not on file    Attends meetings of clubs or organizations: Not on file    Relationship status: Not on file  Other Topics Concern  . Not on file  Social History Narrative  . Not on file    Outpatient Encounter Medications as of 03/11/2018  Medication Sig  . acetaminophen (TYLENOL) 500 MG tablet Take 500 mg by mouth every 6 (six) hours as needed. For pain  . calcipotriene-betamethasone (TACLONEX SCALP) external suspension Apply topically daily as needed.   . clobetasol cream (TEMOVATE) 5.68 % Apply 1 application topically 2 (two) times daily as needed.   . fluticasone (CUTIVATE) 0.05 % cream Apply topically 2 (two) times daily as needed.   Marland Kitchen FREESTYLE LITE test strip   . ketoconazole (NIZORAL) 2 % cream Apply 1 application topically daily as needed.   . Lancets (FREESTYLE) lancets   . levothyroxine (SYNTHROID, LEVOTHROID) 25 MCG tablet 1/2 tablet daily, to be taken with levothyroxine 69mcg tablet daily= 62.39mcg daily  . levothyroxine (SYNTHROID, LEVOTHROID) 50 MCG tablet Take 1 tablet (50 mcg total) by mouth daily before breakfast.  . SEROQUEL 50 MG tablet Take 1 tablet by mouth at bedtime.  . [DISCONTINUED] alendronate (FOSAMAX) 70 MG tablet Take 70 mg by mouth once a week. Take with a full glass of water on an empty stomach.   No facility-administered encounter medications on file as of 03/11/2018.     Activities of Daily Living In your present state of health, do you have any difficulty performing the following activities: 03/11/2018 08/20/2017  Hearing? N N  Vision? N N   Difficulty concentrating or making decisions? N N  Walking or climbing stairs? N N  Dressing or bathing? N N  Doing errands, shopping? N N  Some recent data might be hidden    Patient Care Team: Esaw Grandchild, NP as PCP - General (Family Medicine) Juanita Craver, MD as Consulting Physician (Gastroenterology) Anda Kraft, MD as Consulting Physician (Endocrinology) Allyn Kenner, MD (  Dermatology) Terrance Mass, MD (Inactive) (Gynecology) Kathie Rhodes, MD as Consulting Physician (Urology) Maurya Nethery Apo, MD as Consulting Physician (Ophthalmology) Chauncey Mann, MD as Referring Physician (Psychiatry)    Assessment:   This is a routine wellness examination for E[place]ie.  Exercise Activities and Dietary recommendations    Goals   None     Fall Risk Fall Risk  03/11/2018 04/23/2017 10/11/2015  Falls in the past year? 0 No No  Comment - - Emmi Telephone Survey: data to providers prior to load   Is the patient's home free of loose throw rugs in walkways, pet beds, electrical cords, etc?   yes      Grab bars in the bathroom? yes      Handrails on the stairs?   yes      Adequate lighting?   yes  Timed Get Up and Go performed: <20 sec, good mobolity  Depression Screen PHQ 2/9 Scores 03/11/2018 12/24/2017 08/20/2017 04/23/2017  PHQ - 2 Score 0 0 0 0  PHQ- 9 Score 0 0 0 -     Cognitive Function     6CIT Screen 03/11/2018  What Year? 0 points  What month? 0 points  What time? 0 points  Count back from 20 0 points  Months in reverse 0 points  Repeat phrase 2 points  Total Score 2    Immunization History  Administered Date(s) Administered  . Influenza, High Dose Seasonal PF 11/08/2017  . Influenza-Unspecified 11/08/2017  . Pneumococcal Conjugate-13 04/01/2014  . Pneumococcal Polysaccharide-23 04/23/2017  . Tdap 01/14/2012, 02/09/2012  . Zoster 10/13/2012  . Zoster Recombinat (Shingrix) 10/18/2016, 12/18/2016    Qualifies for Shingles Vaccine?No  Screening  Tests Health Maintenance  Topic Date Due  . FOOT EXAM  03/11/1955  . URINE MICROALBUMIN  04/04/2017  . HEMOGLOBIN A1C  02/20/2018  . OPHTHALMOLOGY EXAM  02/28/2018  . Hepatitis C Screening  04/24/2018 (Originally [birth year])  . MAMMOGRAM  08/06/2019  . TETANUS/TDAP  02/08/2022  . COLONOSCOPY  11/07/2027  . INFLUENZA VACCINE  Completed  . DEXA SCAN  Completed  . PNA vac Low Risk Adult  Completed    Cancer Screenings: Lung: Low Dose CT Chest recommended if Age 9-80 years, 30 pack-year currently smoking OR have quit w/in 15years. Patient does not qualify. Breast:  Up to date on Mammogram? Yes   Up to date of Bone Density/Dexa? Yes Colorectal: UTD, last 11/06/2017, polyps  Additional Screenings: : Hepatitis C Screening: Completed today     Plan:   Follow Mediterranean Diet Remain as active as possible We will call when lab results are available Continue all medications as directed. Remain we well hydrated. HAPPY BIRHTDAY Thoughts and prayers for you and your husband Follow-up in 6 months  I have personally reviewed and noted the following in the patient's chart:   . Medical and social history . Use of alcohol, tobacco or illicit drugs  . Current medications and supplements . Functional ability and status . Nutritional status . Physical activity . Advanced directives . List of other physicians . Hospitalizations, surgeries, and ER visits in previous 12 months . Vitals . Screenings to include cognitive, depression, and falls . Referrals and appointments  In addition, I have reviewed and discussed with patient certain preventive protocols, quality metrics, and best practice recommendations. A written personalized care plan for preventive services as well as general preventive health recommendations were provided to patient.     Esaw Grandchild, NP  03/11/2018

## 2018-03-11 NOTE — Patient Instructions (Signed)
  [name] , Thank you for taking time to come for your Medicare Wellness Visit. I appreciate your ongoing commitment to your health goals. Please review the following plan we discussed and let me know if I can assist you in the future.   These are the goals we discussed: Follow Mediterranean Diet Remain as active as possible We will call when lab results are available Continue all medications as directed. Remain we well hydrated. HAPPY BIRHTDAY Thoughts and prayers for you and your husband Follow-up in 6 months   This is a list of the screening recommended for you and due dates:  Health Maintenance  Topic Date Due  . Complete foot exam   03/11/1955  . Urine Protein Check  04/04/2017  . Hemoglobin A1C  02/20/2018  . Eye exam for diabetics  02/28/2018  .  Hepatitis C: One time screening is recommended by Center for Disease Control  (CDC) for  adults born from [birth year] through 1963.   04/24/2018*  . Mammogram  08/06/2019  . Tetanus Vaccine  02/08/2022  . Colon Cancer Screening  11/07/2027  . Flu Shot  Completed  . DEXA scan (bone density measurement)  Completed  . Pneumonia vaccines  Completed  *Topic was postponed. The date shown is not the original due date.

## 2018-03-12 ENCOUNTER — Other Ambulatory Visit: Payer: Self-pay | Admitting: Adult Health

## 2018-03-12 DIAGNOSIS — F3174 Bipolar disorder, in full remission, most recent episode manic: Secondary | ICD-10-CM | POA: Diagnosis not present

## 2018-03-12 DIAGNOSIS — E039 Hypothyroidism, unspecified: Secondary | ICD-10-CM

## 2018-03-12 DIAGNOSIS — F5105 Insomnia due to other mental disorder: Secondary | ICD-10-CM | POA: Diagnosis not present

## 2018-03-12 LAB — CBC WITH DIFFERENTIAL/PLATELET
Basophils Absolute: 0 10*3/uL (ref 0.0–0.2)
Basos: 1 %
EOS (ABSOLUTE): 0.1 10*3/uL (ref 0.0–0.4)
Eos: 2 %
Hematocrit: 37.6 % (ref 34.0–46.6)
Hemoglobin: 13 g/dL (ref 11.1–15.9)
IMMATURE GRANULOCYTES: 0 %
Immature Grans (Abs): 0 10*3/uL (ref 0.0–0.1)
Lymphocytes Absolute: 1.5 10*3/uL (ref 0.7–3.1)
Lymphs: 32 %
MCH: 30.5 pg (ref 26.6–33.0)
MCHC: 34.6 g/dL (ref 31.5–35.7)
MCV: 88 fL (ref 79–97)
Monocytes Absolute: 0.3 10*3/uL (ref 0.1–0.9)
Monocytes: 7 %
NEUTROS PCT: 58 %
Neutrophils Absolute: 2.8 10*3/uL (ref 1.4–7.0)
Platelets: 180 10*3/uL (ref 150–450)
RBC: 4.26 x10E6/uL (ref 3.77–5.28)
RDW: 13 % (ref 11.7–15.4)
WBC: 4.7 10*3/uL (ref 3.4–10.8)

## 2018-03-12 LAB — COMPREHENSIVE METABOLIC PANEL
ALT: 37 IU/L — ABNORMAL HIGH (ref 0–32)
AST: 31 IU/L (ref 0–40)
Albumin/Globulin Ratio: 1.7 (ref 1.2–2.2)
Albumin: 4.5 g/dL (ref 3.7–4.7)
Alkaline Phosphatase: 70 IU/L (ref 39–117)
BUN/Creatinine Ratio: 15 (ref 12–28)
BUN: 20 mg/dL (ref 8–27)
Bilirubin Total: 0.7 mg/dL (ref 0.0–1.2)
CO2: 23 mmol/L (ref 20–29)
Calcium: 11.1 mg/dL — ABNORMAL HIGH (ref 8.7–10.3)
Chloride: 108 mmol/L — ABNORMAL HIGH (ref 96–106)
Creatinine, Ser: 1.31 mg/dL — ABNORMAL HIGH (ref 0.57–1.00)
GFR calc Af Amer: 47 mL/min/{1.73_m2} — ABNORMAL LOW (ref >59)
GFR calc non Af Amer: 41 mL/min/{1.73_m2} — ABNORMAL LOW (ref >59)
GLUCOSE: 125 mg/dL — AB (ref 65–99)
Globulin, Total: 2.6 g/dL (ref 1.5–4.5)
Potassium: 4.3 mmol/L (ref 3.5–5.2)
Sodium: 145 mmol/L — ABNORMAL HIGH (ref 134–144)
Total Protein: 7.1 g/dL (ref 6.0–8.5)

## 2018-03-12 LAB — TSH: TSH: 6.22 u[IU]/mL — ABNORMAL HIGH (ref 0.450–4.500)

## 2018-03-12 LAB — LIPID PANEL
Chol/HDL Ratio: 3.4 ratio (ref 0.0–4.4)
Cholesterol, Total: 203 mg/dL — ABNORMAL HIGH (ref 100–199)
HDL: 59 mg/dL (ref >39)
LDL Calculated: 123 mg/dL — ABNORMAL HIGH (ref 0–99)
Triglycerides: 107 mg/dL (ref 0–149)
VLDL CHOLESTEROL CAL: 21 mg/dL (ref 5–40)

## 2018-03-12 LAB — HIV ANTIBODY (ROUTINE TESTING W REFLEX): HIV Screen 4th Generation wRfx: NONREACTIVE

## 2018-03-12 LAB — HEPATITIS C ANTIBODY: Hep C Virus Ab: 0.2 s/co ratio (ref 0.0–0.9)

## 2018-03-12 MED ORDER — LEVOTHYROXINE SODIUM 25 MCG PO TABS: ORAL_TABLET | ORAL | 1 refills | Status: DC

## 2018-04-22 ENCOUNTER — Other Ambulatory Visit: Payer: Medicare Other

## 2018-04-22 DIAGNOSIS — E039 Hypothyroidism, unspecified: Secondary | ICD-10-CM | POA: Diagnosis not present

## 2018-04-23 LAB — TSH: TSH: 2.38 u[IU]/mL (ref 0.450–4.500)

## 2018-06-07 ENCOUNTER — Other Ambulatory Visit: Payer: Self-pay | Admitting: Adult Health

## 2018-06-15 ENCOUNTER — Encounter: Payer: Self-pay | Admitting: Adult Health

## 2018-06-17 ENCOUNTER — Ambulatory Visit (INDEPENDENT_AMBULATORY_CARE_PROVIDER_SITE_OTHER): Payer: Medicare Other | Admitting: Adult Health

## 2018-06-17 ENCOUNTER — Other Ambulatory Visit: Payer: Self-pay

## 2018-06-17 ENCOUNTER — Encounter: Payer: Self-pay | Admitting: Adult Health

## 2018-06-17 VITALS — Ht 61.5 in | Wt 130.0 lb

## 2018-06-17 DIAGNOSIS — M62838 Other muscle spasm: Secondary | ICD-10-CM | POA: Diagnosis not present

## 2018-06-17 DIAGNOSIS — M545 Low back pain, unspecified: Secondary | ICD-10-CM

## 2018-06-17 DIAGNOSIS — F3174 Bipolar disorder, in full remission, most recent episode manic: Secondary | ICD-10-CM | POA: Diagnosis not present

## 2018-06-17 DIAGNOSIS — F5105 Insomnia due to other mental disorder: Secondary | ICD-10-CM | POA: Diagnosis not present

## 2018-06-17 MED ORDER — CYCLOBENZAPRINE HCL 5 MG PO TABS: 5.0000 mg | ORAL_TABLET | Freq: Every day | ORAL | 0 refills | Status: DC

## 2018-06-17 NOTE — Assessment & Plan Note (Signed)
Assessment and Plan: Continue alternating Ice/Heat, Aspercream, Acetaminophen Daily Stretching as tolerated Cyclobenzaprine 5mg  1/2- 1 tab QHS- discussed sedation issues with this medication to use cautiously- never with ETOH Pt verbalized understanding and agreement  Follow Up Instructions: PRN   I discussed the assessment and treatment plan with the patient. The patient was provided an opportunity to ask questions and all were answered. The patient agreed with the plan and demonstrated an understanding of the instructions.   The patient was advised to call back or seek an in-person evaluation if the symptoms worsen or if the condition fails to improve as anticipated.

## 2018-06-17 NOTE — Progress Notes (Signed)
Virtual Visit via Telephone Note  I connected with [place] on 06/17/18 at  9:45 AM EDT by telephone and verified that I am speaking with the correct person using two identifiers.  Location: Patient: Home Provider: In Clinic   I discussed the limitations, risks, security and privacy concerns of performing an evaluation and management service by telephone and the availability of in person appointments. I also discussed with the patient that there may be a patient responsible charge related to this service. The patient expressed understanding and agreed to proceed.   History of Present Illness: Ms. Ke calls in today with lower back pain that began 3 days after "lifting boxes and tidying up the house".  He denies feeling or hearing a pop. She denies bowel/bladder dysfunction She denies numbness/tingling in lower extremities She reports pain worsens when moving from standing to sitting position or sitting to standing position. Pain described as "tightness" and "a strain" Pain rated 6/10 She has been alternating Ice, Heat, using OTC Aspercream and OTC Acetaminophen- with some sx relief She has used Cyclobenzaprine in past, tolerated well CMP 02/2018 GFR - 41 Review of Systems: General:   Denies fever, chills, unexplained weight loss.  Optho/Auditory:   Denies visual changes, blurred vision/LOV Respiratory:   Denies SOB, DOE more than baseline levels.  Cardiovascular:   Denies chest pain, palpitations, new onset peripheral edema  Gastrointestinal:   Denies nausea, vomiting, diarrhea.  Genitourinary: Denies dysuria, freq/ urgency, flank pain or discharge from genitals.  Endocrine:     Denies hot or cold intolerance, polyuria, polydipsia. Musculoskeletal:  Myalgias +, joint swelling, unexplained arthralgias, gait problems.  Skin:  Denies rash, suspicious lesions Neurological:     Denies dizziness, unexplained weakness, numbness  Psychiatric/Behavioral:   Denies mood changes,  suicidal or homicidal ideations, hallucinations This patient does not have sx concerning for COVID-19 Infection (ie; fever, chills, cough, new or worsening shortness of breath).    Observations/Objective: No acute distress noted during telephone conversation  Assessment and Plan: Continue alternating Ice/Heat, Aspercream, Acetaminophen Daily Stretching as tolerated Cyclobenzaprine 5mg  1/2- 1 tab QHS- discussed sedation issues with this medication to use cautiously- never with ETOH Pt verbalized understanding and agreement  Follow Up Instructions: PRN   I discussed the assessment and treatment plan with the patient. The patient was provided an opportunity to ask questions and all were answered. The patient agreed with the plan and demonstrated an understanding of the instructions.   The patient was advised to call back or seek an in-person evaluation if the symptoms worsen or if the condition fails to improve as anticipated.  I provided 15 minutes of non-face-to-face time during this encounter.   Esaw Grandchild, NP

## 2018-06-25 ENCOUNTER — Other Ambulatory Visit: Payer: Self-pay | Admitting: Adult Health

## 2018-06-25 DIAGNOSIS — Z1231 Encounter for screening mammogram for malignant neoplasm of breast: Secondary | ICD-10-CM

## 2018-06-30 ENCOUNTER — Telehealth: Payer: Self-pay | Admitting: Adult Health

## 2018-06-30 NOTE — Telephone Encounter (Signed)
Patient confused states she can't remember if she was advised to schedule a 6 mth or 6 week OV on her 06/17/18 Virtual OV-- I check& advised it says only to return of symptoms worsen or fail to improve ( but did discover on the 1/28 OV she was told to schedule a 56month--- Pt ask that nurse review w/provider & call her back to clarify.@ [phone]  --Forwarding message to medical assistant. --Dion Body

## 2018-06-30 NOTE — Telephone Encounter (Signed)
Pt informed that she is to follow up in late 08/2018 or early 09/2018.  Pt expressed understanding an was transferred to front desk to schedule appt.  Charyl Bigger, CMA

## 2018-08-18 ENCOUNTER — Ambulatory Visit
Admission: RE | Admit: 2018-08-18 | Discharge: 2018-08-18 | Disposition: A | Discharge status: Home / Self Care | Payer: Medicare Other | Source: Ambulatory Visit | Attending: Adult Health | Admitting: Adult Health

## 2018-08-18 DIAGNOSIS — Z1231 Encounter for screening mammogram for malignant neoplasm of breast: Secondary | ICD-10-CM | POA: Diagnosis not present

## 2018-08-27 DIAGNOSIS — L218 Other seborrheic dermatitis: Secondary | ICD-10-CM | POA: Diagnosis not present

## 2018-08-27 DIAGNOSIS — C44519 Basal cell carcinoma of skin of other part of trunk: Secondary | ICD-10-CM | POA: Diagnosis not present

## 2018-09-06 ENCOUNTER — Encounter: Payer: Self-pay | Admitting: Adult Health

## 2018-09-08 ENCOUNTER — Telehealth: Payer: Self-pay

## 2018-09-08 ENCOUNTER — Other Ambulatory Visit: Payer: Self-pay

## 2018-09-08 MED ORDER — FREESTYLE LANCETS MISC: 11 refills | Status: DC

## 2018-09-08 MED ORDER — LEVOTHYROXINE SODIUM 25 MCG PO TABS: ORAL_TABLET | ORAL | 1 refills | Status: DC

## 2018-09-08 MED ORDER — FREESTYLE LITE TEST VI STRP: ORAL_STRIP | 11 refills | Status: DC

## 2018-09-08 MED ORDER — LEVOTHYROXINE SODIUM 50 MCG PO TABS: 50.0000 ug | ORAL_TABLET | Freq: Every day | ORAL | 1 refills | Status: DC

## 2018-09-08 NOTE — Telephone Encounter (Signed)
Refills requested thru [place].  Charyl Bigger, CMA

## 2018-09-10 ENCOUNTER — Other Ambulatory Visit: Payer: Self-pay

## 2018-09-10 ENCOUNTER — Ambulatory Visit (INDEPENDENT_AMBULATORY_CARE_PROVIDER_SITE_OTHER): Payer: Medicare Other | Admitting: Adult Health

## 2018-09-10 ENCOUNTER — Encounter: Payer: Self-pay | Admitting: Adult Health

## 2018-09-10 VITALS — BP 134/65 | HR 57 | Temp 98.5°F | Ht 61.5 in | Wt 134.6 lb

## 2018-09-10 DIAGNOSIS — M810 Age-related osteoporosis without current pathological fracture: Secondary | ICD-10-CM

## 2018-09-10 DIAGNOSIS — E099 Drug or chemical induced diabetes mellitus without complications: Secondary | ICD-10-CM

## 2018-09-10 DIAGNOSIS — E785 Hyperlipidemia, unspecified: Secondary | ICD-10-CM | POA: Diagnosis not present

## 2018-09-10 DIAGNOSIS — R7303 Prediabetes: Secondary | ICD-10-CM

## 2018-09-10 DIAGNOSIS — T39395A Adverse effect of other nonsteroidal anti-inflammatory drugs [NSAID], initial encounter: Secondary | ICD-10-CM

## 2018-09-10 DIAGNOSIS — Z0000 Encounter for general adult medical examination without abnormal findings: Secondary | ICD-10-CM

## 2018-09-10 LAB — POCT GLYCOSYLATED HEMOGLOBIN (HGB A1C): Hemoglobin A1C: 6.1 % — AB (ref 4.0–5.6)

## 2018-09-10 NOTE — Assessment & Plan Note (Signed)
Lab Results  Component Value Date   HGBA1C 6.1 (A) 09/10/2018   HGBA1C 6.2 (A) 03/11/2018   HGBA1C 5.8 (A) 08/20/2017  Will continue to monitor

## 2018-09-10 NOTE — Assessment & Plan Note (Signed)
Continue wt bearing exericse

## 2018-09-10 NOTE — Assessment & Plan Note (Signed)
>>  ASSESSMENT AND PLAN FOR PRE-DIABETES WRITTEN ON 09/10/2018 10:48 AM BY DANFORD, KATY D, NP  Lab Results  Component Value Date   HGBA1C 6.1 (A) 09/10/2018   HGBA1C 6.2 (A) 03/11/2018   HGBA1C 5.8 (A) 08/20/2017  Will continue to monitor

## 2018-09-10 NOTE — Progress Notes (Signed)
Subjective:    Patient ID: [name], female    DOB: 1945-10-12, 73 y.o.   MRN: [id number]  HPI: [name] is here for regular f/u: Osteoporosis, Bipolar Disorder, Hypothyroidism Her husband passed away 04-22-18- she reports excellent support system from her son, grandchildren, her siblings, and her church family. She is followed by Psychiatry/Dr. Orpah Cobb- mood stable on Seroquel 50mg  QD She denies SI/HI She is living by herself and managing quite well She drinks water all day, has one cup of coffee in am She follows heart healthy diet, limits sugar/CHO She walks, weather permitting She denies acute complaints She has been very consistent with social distancing and wearing a mask when in public  Last TSH 05/05/4008-UVO, 2.380  Lab Results  Component Value Date   HGBA1C 6.1 (A) 09/10/2018   HGBA1C 6.2 (A) 03/11/2018   HGBA1C 5.8 (A) 08/20/2017     Patient Care Team    Relationship Specialty Notifications Start End  Mina Marble D, NP PCP - General Family Medicine  04/23/17   Juanita Craver, MD Consulting Physician Gastroenterology  04/23/17   Anda Kraft, MD Consulting Physician Endocrinology  04/23/17   Allyn Kenner, MD  Dermatology  04/23/17   Terrance Mass, MD (Inactive)  Gynecology  04/23/17   Kathie Rhodes, MD Consulting Physician Urology  04/23/17   Katy Apo, MD Consulting Physician Ophthalmology  04/23/17   Chauncey Mann, MD Referring Physician Psychiatry  04/23/17     Patient Active Problem List   Diagnosis Date Noted  . Pre-diabetes 09/10/2018  . Muscle spasm 06/17/2018  . Acute bilateral low back pain without sciatica 06/17/2018  . Disease of thyroid gland 11/25/2017  . Diabetes mellitus due to non-steroid drug without complication ([place]) 53/66/4403  . Healthcare maintenance 04/23/2017  . Type 2 diabetes mellitus ([place]) 04/23/2017  . Hyperlipidemia 04/23/2017  . Decreased GFR 04/23/2017  . Elevated LFTs 04/23/2017  . Osteoporosis 08/23/2015  .  Primary hyperparathyroidism ([place]) 03/27/2012  . Abdominal pain   . Hyponatremia 02/13/2012  . Chronic diastolic CHF (congestive heart failure) ([place]) 02/13/2012  . Hypothyroidism 02/11/2012  . CVA (cerebral infarction) 02/10/2012  . Ataxia 02/10/2012  . Bipolar 1 disorder ([place]) 02/10/2012  . Hypotension, postural 02/09/2012    Class: Acute  . Bipolar 1 disorder, manic, moderate ([place]) 02/05/2012     Past Medical History:  Diagnosis Date  . Bipolar disorder ([place])   . Chills without fever    intermittant  . Depression   . History of colon polyps    2009 TUBULAR ADENOMA  . History of concussion    02-05-2012--  fell and hit head (had previous falls prior to this day) dx mild concussion -- per MRI and neurologist note (dr Leonie Man)  right frontal lob w/ shear injuries consistant with fall/trauma  . History of Graves' disease    s/p  radiactive iodine therapy 1989  . History of primary hyperparathyroidism    W/  HYPERCALCEMIA--  s/p  left inferior parathyroidectomy 2014  . Hyperlipidemia   . Hypothyroidism, postradioiodine therapy 1989  approx   endocrinologist-  dr Gaylene Brooks [facility]  . Increased urinary frequency   . Osteoporosis   . Type 2 diabetes mellitus ([place])      Past Surgical History:  Procedure Laterality Date  . CYSTOSCOPY N/A 12/19/2015   Procedure: Rolly Salter UNDER ANESTHESIA;  Surgeon: Kathie Rhodes, MD;  Location: [facility]  Service: Urology;  Laterality: N/A;  . PARATHYROIDECTOMY Left 03/27/2012  Procedure: Left Inferior Mininally Invasive PARATHYROIDECTOMY;  Surgeon: Earnstine Regal, MD;  Location: [facility];  Service: General;  Laterality: Left;  Left Inferior Mininally Invasive Parathyroidectomy---  hypercellular adenoma  . TRANSOBTURATOR SLING  09/01/2004  . transvaginal mesh placement    . TUBAL LIGATION  1978  . VAGINAL HYSTERECTOMY  1986    WITH OVARIAN PRESERVATION      Family History  Problem Relation Age of Onset  .  Cancer Mother        MELANOMA  . Diabetes Maternal Aunt   . Heart attack Brother   . Diabetes Maternal Uncle      Social History   Substance and Sexual Activity  Drug Use No     Social History   Substance and Sexual Activity  Alcohol Use No  . Alcohol/week: 0.0 standard drinks     Social History   Tobacco Use  Smoking Status Former Smoker  . Packs/day: 1.00  . Years: 30.00  . Pack years: 30.00  . Types: Cigarettes  . Quit date: 03/18/1997  . Years since quitting: 21.4  Smokeless Tobacco Never Used     Outpatient Encounter Medications as of 09/10/2018  Medication Sig  . acetaminophen (TYLENOL) 500 MG tablet Take 500 mg by mouth every 6 (six) hours as needed. For pain  . calcipotriene-betamethasone (TACLONEX SCALP) external suspension Apply topically daily as needed.   . clobetasol cream (TEMOVATE) 8.56 % Apply 1 application topically 2 (two) times daily as needed.   . cyclobenzaprine (FLEXERIL) 5 MG tablet Take 1 tablet (5 mg total) by mouth at bedtime. 1/2 to 1 tablet as needed at bedtime  . fluticasone (CUTIVATE) 0.05 % cream Apply topically 2 (two) times daily as needed.   Marland Kitchen FREESTYLE LITE test strip Use to check blood sugars every morning fasting and 2 hours after largest meal  . ketoconazole (NIZORAL) 2 % cream Apply 1 application topically daily as needed.   . Lancets (FREESTYLE) lancets Use to check blood sugars every morning fasting and 2 hours after largest meal  . levothyroxine (SYNTHROID) 25 MCG tablet 1 tablet daily, to be taken with levothyroxine 44mcg tablet daily= 59mcg daily  . levothyroxine (SYNTHROID) 50 MCG tablet Take 1 tablet (50 mcg total) by mouth daily before breakfast.  . SEROQUEL 50 MG tablet Take 1 tablet by mouth at bedtime.   No facility-administered encounter medications on file as of 09/10/2018.     Allergies: Aspirin, Lipitor [atorvastatin calcium], Trimethoprim, Vivelle [estradiol], Zetia [ezetimibe], Zocor [simvastatin], and Sulfa  antibiotics  Body mass index is 25.02 kg/m.  Blood pressure 134/65, pulse (!) 57, temperature 98.5 F (36.9 C), temperature source Oral, height 5' 1.5" (1.562 m), weight 134 lb 9.6 oz (61.1 kg), SpO2 100 %.  Review of Systems  Constitutional: Positive for fatigue. Negative for activity change, appetite change, chills, diaphoresis, fever and unexpected weight change.  Eyes: Negative for visual disturbance.  Respiratory: Negative for cough, chest tightness, shortness of breath, wheezing and stridor.   Cardiovascular: Negative for chest pain, palpitations and leg swelling.  Gastrointestinal: Negative for abdominal distention, anal bleeding, blood in stool, constipation, diarrhea, nausea and vomiting.  Endocrine: Negative for cold intolerance, heat intolerance, polydipsia, polyphagia and polyuria.  Genitourinary: Negative for difficulty urinating and flank pain.  Musculoskeletal: Negative for arthralgias, back pain, gait problem, joint swelling, myalgias, neck pain and neck stiffness.  Skin: Negative for color change, pallor, rash and wound.  Neurological: Negative for dizziness and headaches.  Hematological: Negative for adenopathy. Does not  bruise/bleed easily.  Psychiatric/Behavioral: Negative for agitation, behavioral problems, confusion, decreased concentration, dysphoric mood, hallucinations, self-injury, sleep disturbance and suicidal ideas. The patient is not nervous/anxious and is not hyperactive.        Objective:   Physical Exam Constitutional:      General: She is not in acute distress.    Appearance: Normal appearance. She is normal weight. She is not ill-appearing, toxic-appearing or diaphoretic.  HENT:     Head: Normocephalic and atraumatic.  Cardiovascular:     Rate and Rhythm: Normal rate and regular rhythm.     Pulses: Normal pulses.     Heart sounds: Normal heart sounds. No murmur. No friction rub. No gallop.   Pulmonary:     Effort: Pulmonary effort is normal. No  respiratory distress.     Breath sounds: Normal breath sounds. No wheezing, rhonchi or rales.  Chest:     Chest wall: No tenderness.  Skin:    General: Skin is warm and dry.     Capillary Refill: Capillary refill takes less than 2 seconds.     Findings: No erythema.          Comments: Healing puncture wound on central anterior chest- dermatologist performed bx of atypical nevi recently   Neurological:     Mental Status: She is alert and oriented to person, place, and time.     Coordination: Coordination normal.  Psychiatric:        Mood and Affect: Mood normal.        Behavior: Behavior normal.        Thought Content: Thought content normal.        Judgment: Judgment normal.       Assessment & Plan:   1. Diabetes mellitus due to non-steroid drug without complication ([place])   2. Healthcare maintenance   3. Hyperlipidemia, unspecified hyperlipidemia type   4. Age-related osteoporosis without current pathological fracture   5. Pre-diabetes     Healthcare maintenance Continue all medications as directed. Remain well hydrated, follow Mediterranean diet. Remain as active as possible. Your blood sugar is very well controlled- A12c is 6.1 today! Follow-up in 6 months, please come fasting. Continue to social distance and wear a mask when in public.  Hyperlipidemia Will check lipids at f/u in 6 months   Osteoporosis Continue wt bearing exericse  Pre-diabetes Lab Results  Component Value Date   HGBA1C 6.1 (A) 09/10/2018   HGBA1C 6.2 (A) 03/11/2018   HGBA1C 5.8 (A) 08/20/2017  Will continue to monitor     FOLLOW-UP:  Return in about 6 months (around 03/13/2019).

## 2018-09-10 NOTE — Assessment & Plan Note (Signed)
Will check lipids at f/u in 6 months

## 2018-09-10 NOTE — Assessment & Plan Note (Signed)
Continue all medications as directed. Remain well hydrated, follow Mediterranean diet. Remain as active as possible. Your blood sugar is very well controlled- A12c is 6.1 today! Follow-up in 6 months, please come fasting. Continue to social distance and wear a mask when in public.

## 2018-09-10 NOTE — Patient Instructions (Addendum)

## 2018-09-24 DIAGNOSIS — F3174 Bipolar disorder, in full remission, most recent episode manic: Secondary | ICD-10-CM | POA: Diagnosis not present

## 2018-09-24 DIAGNOSIS — F5105 Insomnia due to other mental disorder: Secondary | ICD-10-CM | POA: Diagnosis not present

## 2018-09-30 DIAGNOSIS — Z85828 Personal history of other malignant neoplasm of skin: Secondary | ICD-10-CM | POA: Diagnosis not present

## 2018-09-30 DIAGNOSIS — L820 Inflamed seborrheic keratosis: Secondary | ICD-10-CM | POA: Diagnosis not present

## 2018-09-30 DIAGNOSIS — Z08 Encounter for follow-up examination after completed treatment for malignant neoplasm: Secondary | ICD-10-CM | POA: Diagnosis not present

## 2018-10-02 ENCOUNTER — Encounter: Payer: Self-pay | Admitting: Adult Health

## 2018-10-28 DIAGNOSIS — L820 Inflamed seborrheic keratosis: Secondary | ICD-10-CM | POA: Diagnosis not present

## 2018-11-19 ENCOUNTER — Encounter: Payer: Self-pay | Admitting: Adult Health

## 2018-11-19 DIAGNOSIS — F5105 Insomnia due to other mental disorder: Secondary | ICD-10-CM | POA: Diagnosis not present

## 2018-11-19 DIAGNOSIS — F3174 Bipolar disorder, in full remission, most recent episode manic: Secondary | ICD-10-CM | POA: Diagnosis not present

## 2018-11-20 ENCOUNTER — Other Ambulatory Visit: Payer: Self-pay | Admitting: Adult Health

## 2018-11-20 MED ORDER — LEVOTHYROXINE SODIUM 75 MCG PO TABS: 75.0000 ug | ORAL_TABLET | Freq: Every day | ORAL | 3 refills | Status: DC

## 2018-12-18 ENCOUNTER — Ambulatory Visit: Payer: Medicare Other | Admitting: Adult Health

## 2018-12-19 IMAGING — MR MR ABDOMEN WO/W CM
11 of 18 series · 27 of 48 positions shown · IV contrast (multihance)
Comparison: 10/25/2016

CLINICAL DATA: Evaluate for choledocholithiasis.

EXAM:
MRI ABDOMEN WITHOUT AND WITH CONTRAST
TECHNIQUE: Multiplanar multisequence MR imaging of the abdomen was performed
both before and after the administration of intravenous contrast.
CONTRAST:  6 cc of MultiHance

[Series 3: cor haste · coronal · 5.0mm · 0.74mm/px · 2 of 32 slices shown]
[im 1/32]
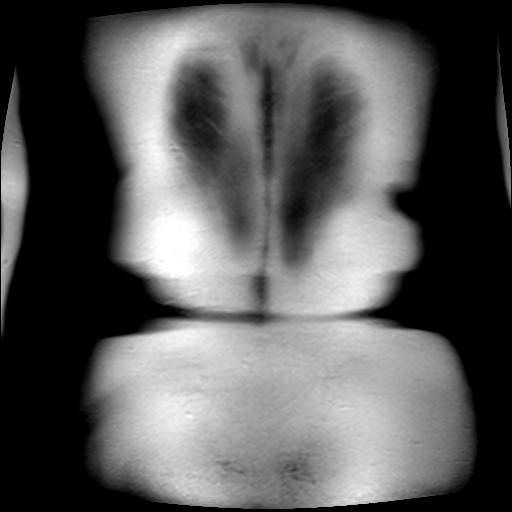
[im 32/32]
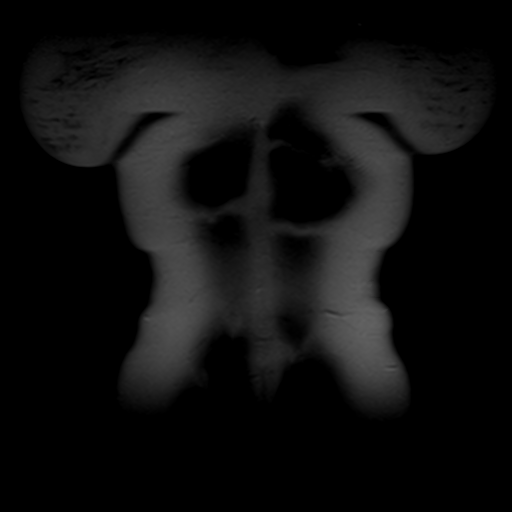

[Series 4: T1 · axial · 6.0mm · 0.74mm/px · z∈[-44,+148]mm · 3 of 60 slices shown]
[im 1/60]
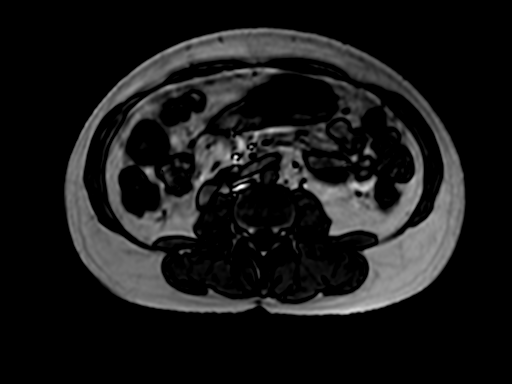
[im 30/60]
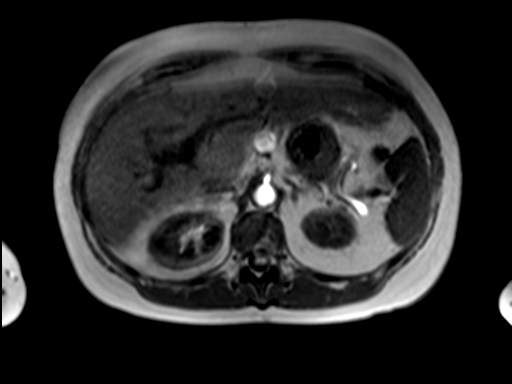
[im 60/60]
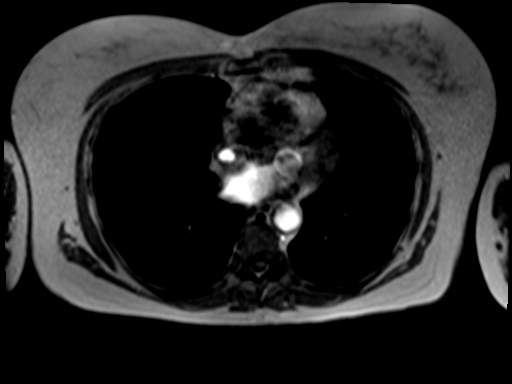

[Series 14: T2 · axial · 6.0mm · 1.12mm/px · z∈[-83,+161]mm · 2 of 35 slices shown]
[im 1/35]
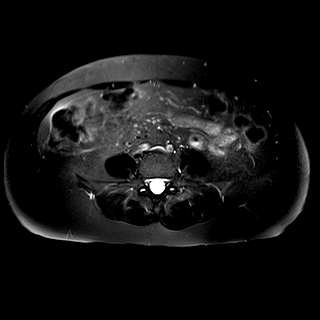
[im 35/35]
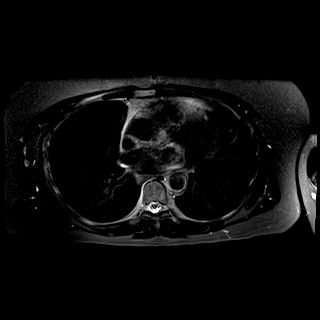

[Series 15: axial haste · axial · 6.0mm · 0.74mm/px · 1 of 30 slices shown]
[im 1/30]
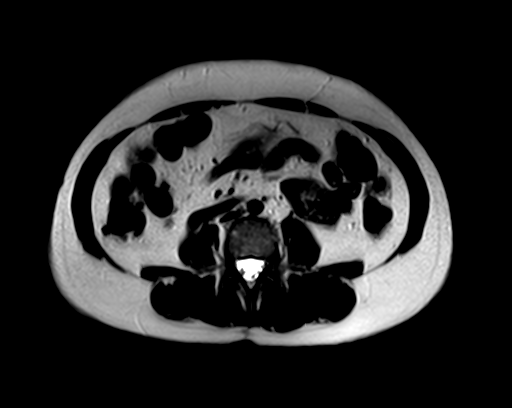

[Series 16: ep2d_[id number]_500_[id number]_trig · axial · 6.0mm · 1.98mm/px · z∈[-65,+143]mm · 4 of 90 slices shown]
[im 1/90]
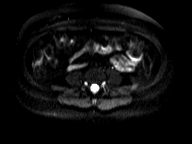
[im 30/90]
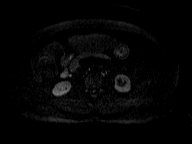
[im 60/90]
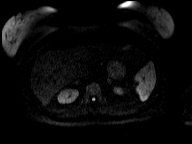
[im 90/90]
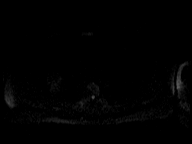

[Series 17: [id number]_trig_adc · axial · 6.0mm · 1.98mm/px · 1 of 30 slices shown]
[im 1/30]
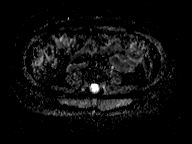

[Series 18: bSSFP · axial · 4.0mm · 0.74mm/px · z∈[-53,+139]mm · 2 of 49 slices shown]
[im 1/49]
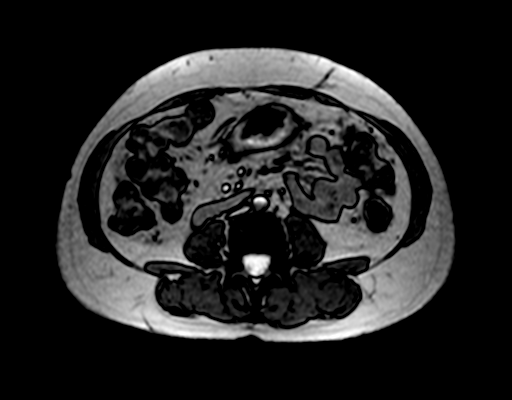
[im 49/49]
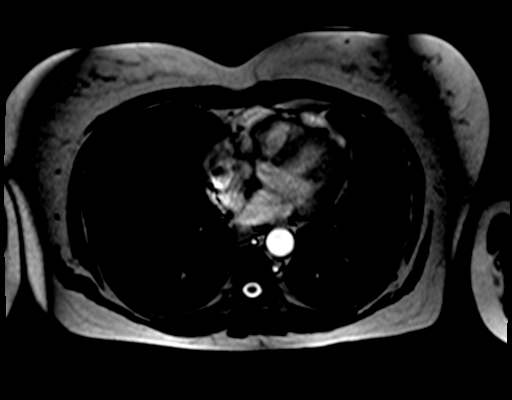

[Series 19: T1 dynamic · axial · non-contrast · 2.5mm · 0.74mm/px · z∈[-56,+141]mm · 3 of 80 slices shown]
[im 1/80]
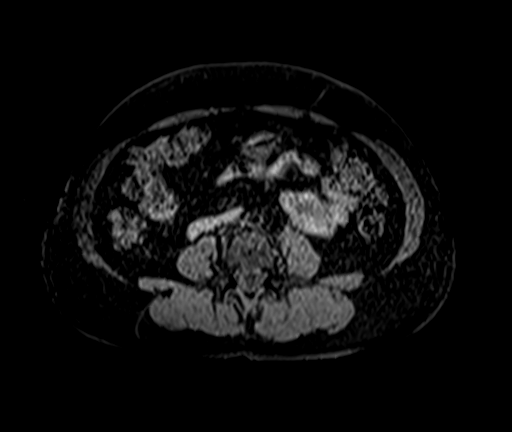
[im 40/80]
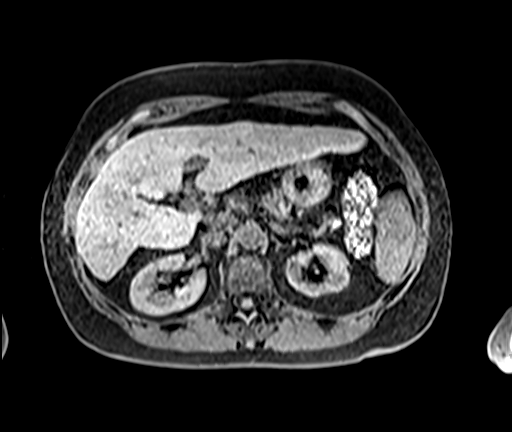
[im 80/80]
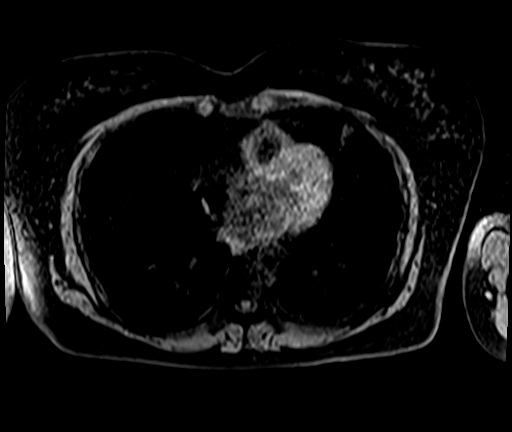

[Series 20: T1 dynamic post-contrast · axial · 2.5mm · 0.74mm/px · z∈[-56,+141]mm · 3 of 80 slices shown (1 of 3)]
[im 1/80]
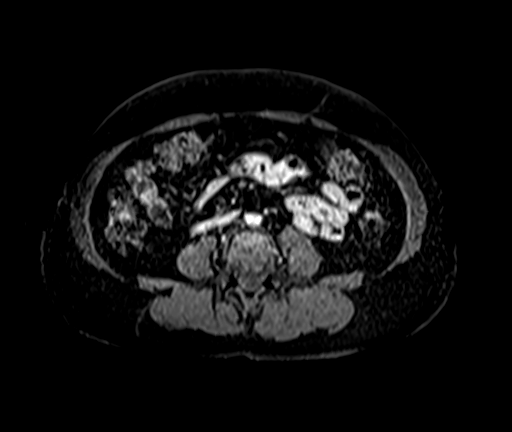
[im 40/80]
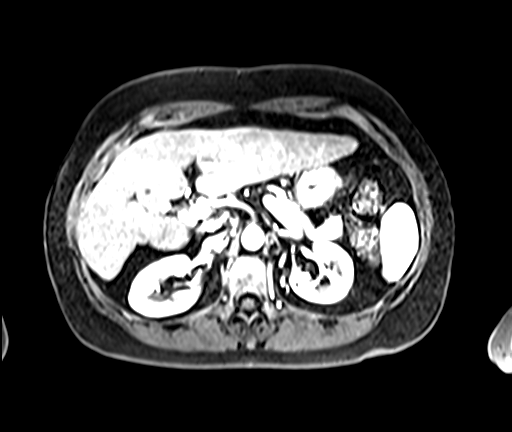
[im 80/80]
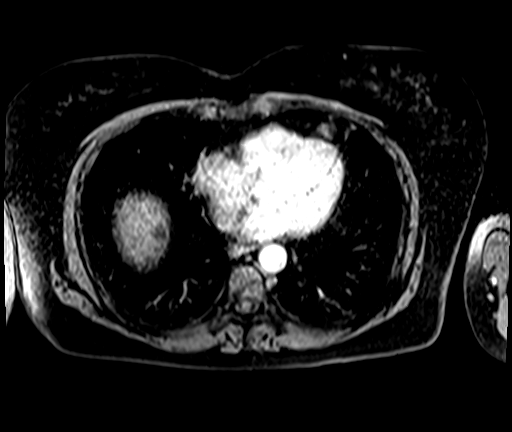

[Series 21: T1 dynamic post-contrast · axial · 2.5mm · 0.74mm/px · z∈[-56,+141]mm · 3 of 80 slices shown (2 of 3)]
[im 1/80]
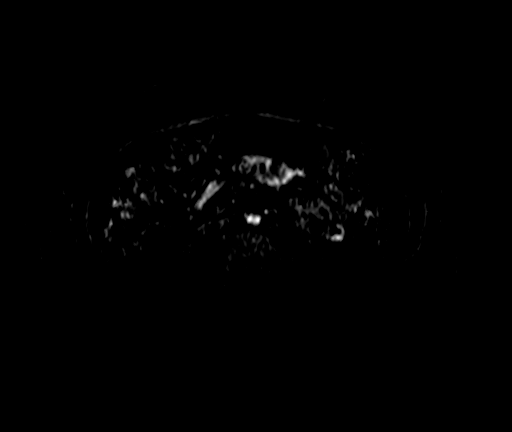
[im 40/80]
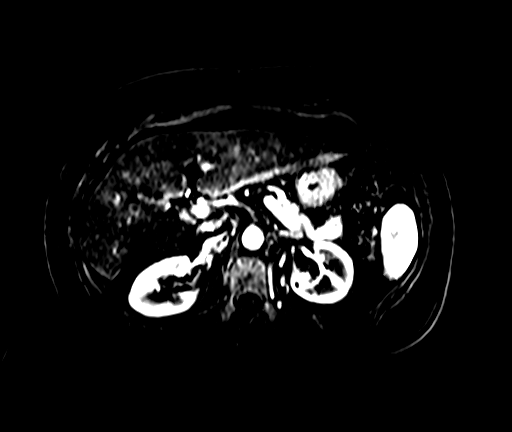
[im 80/80]
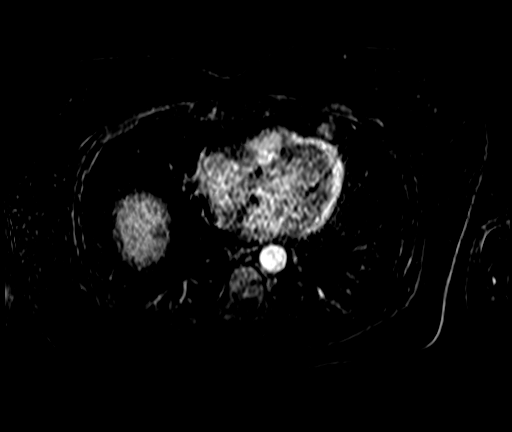

[Series 22: T1 dynamic post-contrast · axial · 2.5mm · 0.74mm/px · z∈[-56,+141]mm · 3 of 80 slices shown (3 of 3)]
[im 1/80]
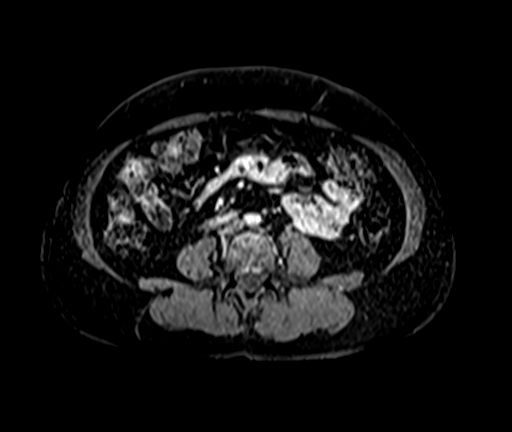
[im 40/80]
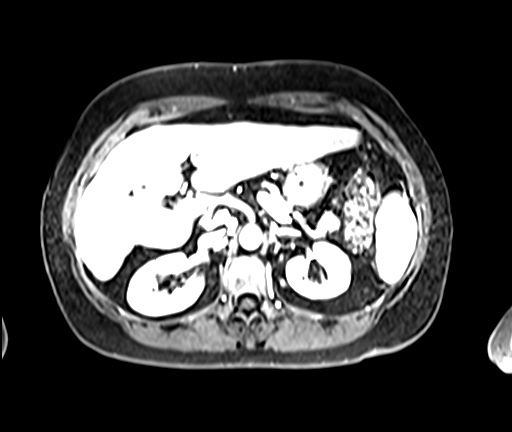
[im 80/80]
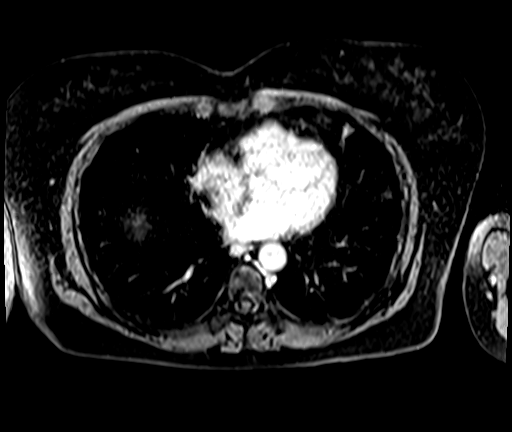

[27 of 48 positions shown; findings below may reference images not displayed]

FINDINGS: Lower chest: No acute findings.

Hepatobiliary: Diffuse hepatic steatosis. Simple appearing cyst
arises from the dome of liver measuring 1.3 cm. Hypertrophy of the
caudate lobe of liver and lateral segment of left lobe of liver
identified. Mildly lobular contour the liver. Multiple tiny stones
are identified throughout the gallbladder. These measure
approximately 3 mm. No gallbladder wall thickening. There is no
intrahepatic biliary dilatation. The common bile duct measures 8 mm
in maximum diameter. No choledocholithiasis.

Pancreas: No mass, inflammatory changes, or other parenchymal
abnormality identified.

Spleen: The spleen measure 9.2 x 5.7 x 10.0 cm (volume = 270
cm^3). No suspicious focal splenic abnormality.

Adrenals/Urinary Tract: The adrenal glands appear normal. Numerous
tiny T2 hyperintense foci are identified within both kidneys. These
are all far too small to characterize. No mass or hydronephrosis.
There is a small hemorrhagic cyst within the anterior cortex of the
inferior pole the right kidney measuring 9 mm, image 51 of series 9

Stomach/Bowel: Visualized portions within the abdomen are
unremarkable.

Vascular/Lymphatic: No pathologically enlarged lymph nodes
identified. No abdominal aortic aneurysm demonstrated.

Other:  None.

Musculoskeletal: No suspicious bone lesions identified.
IMPRESSION: 1. Gallstones.  No evidence for choledocholithiasis.
2. Morphologic features of the liver suggestive of cirrhosis with
diffuse hepatic steatosis.
3. No suspicious liver lesions identified at this time. The
suspected focal liver lesions identified ultrasound from 10/25/2016
may represent areas of focal fatty sparing.

## 2018-12-31 DIAGNOSIS — F5105 Insomnia due to other mental disorder: Secondary | ICD-10-CM | POA: Diagnosis not present

## 2018-12-31 DIAGNOSIS — F3174 Bipolar disorder, in full remission, most recent episode manic: Secondary | ICD-10-CM | POA: Diagnosis not present

## 2019-01-27 DIAGNOSIS — L820 Inflamed seborrheic keratosis: Secondary | ICD-10-CM | POA: Diagnosis not present

## 2019-01-27 DIAGNOSIS — L400 Psoriasis vulgaris: Secondary | ICD-10-CM | POA: Diagnosis not present

## 2019-01-27 DIAGNOSIS — L308 Other specified dermatitis: Secondary | ICD-10-CM | POA: Diagnosis not present

## 2019-01-31 ENCOUNTER — Encounter: Payer: Self-pay | Admitting: Adult Health

## 2019-02-02 ENCOUNTER — Encounter: Payer: Self-pay | Admitting: Adult Health

## 2019-02-04 LAB — HM DIABETES EYE EXAM

## 2019-03-02 ENCOUNTER — Telehealth: Payer: Self-pay | Admitting: Adult Health

## 2019-03-02 NOTE — Telephone Encounter (Signed)
Patient called wanting to speak with clinic staff to make sure she is in a health state well enough to take the vaccine, she wants to make sure PCP is ok with this. Please advise

## 2019-03-03 ENCOUNTER — Other Ambulatory Visit: Payer: Self-pay

## 2019-03-03 NOTE — Telephone Encounter (Signed)
Good Afternoon [name], Can you please call [name] and share- Current guideline for COVID-19 warn against vaccination with known allergy to poly ethylene glycol and ANY anaphylactic reaction to anything- upon chart review she has multiple allergies listed, however I did not see anaphylactic reactions listed or allergy to poly ethy.- please confirm with her and if so- then from a safety stand point she should be fine to receive the vaccine. Thanks! Valetta Fuller

## 2019-03-03 NOTE — Telephone Encounter (Signed)
Please advise.  T. Cambria Osten, CMA 

## 2019-03-03 NOTE — Telephone Encounter (Signed)
03/03/2019  Pt informed.  Pt denies any anaphylactic reactions or allergy to polyethylene glycol.  Advised pt that from a safety standpoint, she should be fine to take the vaccine.  Pt expressed understanding.  Charyl Bigger, CMA

## 2019-03-17 ENCOUNTER — Other Ambulatory Visit: Payer: Self-pay

## 2019-03-17 ENCOUNTER — Ambulatory Visit (INDEPENDENT_AMBULATORY_CARE_PROVIDER_SITE_OTHER): Payer: Medicare Other | Admitting: Adult Health

## 2019-03-17 ENCOUNTER — Encounter: Payer: Self-pay | Admitting: Adult Health

## 2019-03-17 VITALS — BP 106/58 | HR 72 | Temp 98.1°F | Ht 61.56 in | Wt 137.1 lb

## 2019-03-17 DIAGNOSIS — E039 Hypothyroidism, unspecified: Secondary | ICD-10-CM

## 2019-03-17 DIAGNOSIS — R7303 Prediabetes: Secondary | ICD-10-CM

## 2019-03-17 DIAGNOSIS — E785 Hyperlipidemia, unspecified: Secondary | ICD-10-CM

## 2019-03-17 DIAGNOSIS — Z0000 Encounter for general adult medical examination without abnormal findings: Secondary | ICD-10-CM

## 2019-03-17 DIAGNOSIS — R944 Abnormal results of kidney function studies: Secondary | ICD-10-CM | POA: Diagnosis not present

## 2019-03-17 DIAGNOSIS — E099 Drug or chemical induced diabetes mellitus without complications: Secondary | ICD-10-CM | POA: Diagnosis not present

## 2019-03-17 DIAGNOSIS — F3112 Bipolar disorder, current episode manic without psychotic features, moderate: Secondary | ICD-10-CM

## 2019-03-17 DIAGNOSIS — T39395A Adverse effect of other nonsteroidal anti-inflammatory drugs [NSAID], initial encounter: Secondary | ICD-10-CM

## 2019-03-17 LAB — POCT GLYCOSYLATED HEMOGLOBIN (HGB A1C): Hemoglobin A1C: 6 % — AB (ref 4.0–5.6)

## 2019-03-17 LAB — POCT UA - MICROALBUMIN
Creatinine, POC: 50 mg/dL
Microalbumin Ur, POC: 10 mg/L

## 2019-03-17 MED ORDER — FREESTYLE LANCETS MISC: 11 refills | Status: DC

## 2019-03-17 NOTE — Assessment & Plan Note (Addendum)
Lab Results  Component Value Date   HGBA1C 6.0 (A) 03/17/2019   HGBA1C 6.1 (A) 09/10/2018   HGBA1C 6.2 (A) 03/11/2018  She reports AM BG 90-100s She is not on any anti-diabetic medications. Microalbumin- abnromal, however BP 106/58 She reports dizziness with position changes-concerned about her tolerating low dose ACE. Will re-check microalbumin in 6 months

## 2019-03-17 NOTE — Assessment & Plan Note (Signed)
>>  ASSESSMENT AND PLAN FOR PRE-DIABETES WRITTEN ON 03/17/2019  9:15 AM BY DANFORD, KATY D, NP  Lab Results  Component Value Date   HGBA1C 6.0 (A) 03/17/2019   HGBA1C 6.1 (A) 09/10/2018   HGBA1C 6.2 (A) 03/11/2018  She reports AM BG 90-100s She is not on any anti-diabetic medications. Microalbumin- abnromal, however BP 106/58 She reports dizziness with position changes-concerned about her tolerating low dose ACE. Will re-check microalbumin in 6 months

## 2019-03-17 NOTE — Assessment & Plan Note (Signed)
She is followed by Psychiatry/Dr. Orpah Cobb- mood stable on Seroquel 50mg  QD

## 2019-03-17 NOTE — Progress Notes (Signed)
Subjective:    Patient ID: [name], female    DOB: [birth year], 74 y.o.   MRN: [id number]  HPI:09/10/2018 OV: [name] is here for regular f/u: Osteoporosis, Bipolar Disorder, Hypothyroidism Her husband passed away April 23, 2018- she reports excellent support system from her son, grandchildren, her siblings, and her church family. She is followed by Psychiatry/Dr. Orpah Cobb- mood stable on Seroquel 50mg  QD She denies SI/HI She is living by herself and managing quite well She drinks water all day, has one cup of coffee in am She follows heart healthy diet, limits sugar/CHO She walks, weather permitting She denies acute complaints She has been very consistent with social distancing and wearing a mask when in public  Last TSH [id number], 2.380 03/17/2019 OV: [name] is here for regular f/u: Osteoporosis, Bipolar Disorder, Hypothyroidism, Pre-Diabetes She is followed by Psychiatry/Dr. Orpah Cobb- mood stable on Seroquel 50mg  QD She denies SI/HI. She has excellent support system- family/church. She has done well emotionally since the passing of her husband. She reports AM BG 90-100s She is not on any anti-diabetic medications.  Lab Results  Component Value Date   HGBA1C 6.0 (A) 03/17/2019   HGBA1C 6.1 (A) 09/10/2018   HGBA1C 6.2 (A) 03/11/2018   Microalbumin- abnromal, however BP 106/58 She reports dizziness with position changes-concerned about her tolerating low dose ACE. Will re-check microalbumin in 6 months  Patient Care Team    Relationship Specialty Notifications Start End  Mina Marble D, NP PCP - General Family Medicine  04/23/17   Juanita Craver, MD Consulting Physician Gastroenterology  04/23/17   Anda Kraft, MD Consulting Physician Endocrinology  04/23/17   Allyn Kenner, MD  Dermatology  04/23/17   Terrance Mass, MD (Inactive)  Gynecology  04/23/17   Kathie Rhodes, MD Consulting Physician Urology  04/23/17   Daryl Beehler Apo, MD Consulting Physician  Ophthalmology  04/23/17   Chauncey Mann, MD Referring Physician Psychiatry  04/23/17     Patient Active Problem List   Diagnosis Date Noted  . Pre-diabetes 09/10/2018  . Muscle spasm 06/17/2018  . Acute bilateral low back pain without sciatica 06/17/2018  . Disease of thyroid gland 11/25/2017  . Diabetes mellitus due to non-steroid drug without complication ([place]) [id number]  . Healthcare maintenance 04/23/2017  . Type 2 diabetes mellitus ([place]) 04/23/2017  . Hyperlipidemia 04/23/2017  . Decreased GFR 04/23/2017  . Elevated LFTs 04/23/2017  . Osteoporosis 08/23/2015  . Primary hyperparathyroidism ([place]) 03/27/2012  . Abdominal pain   . Hyponatremia 02/13/2012  . Chronic diastolic CHF (congestive heart failure) ([employer]) 02/13/2012  . Hypothyroidism 02/11/2012  . CVA (cerebral infarction) 02/10/2012  . Ataxia 02/10/2012  . Bipolar 1 disorder ([place]) 02/10/2012  . Hypotension, postural 02/09/2012  . Bipolar 1 disorder, manic, moderate ([place]) 02/05/2012     Past Medical History:  Diagnosis Date  . Bipolar disorder ([place])   . Chills without fever    intermittant  . Depression   . History of colon polyps    2009 TUBULAR ADENOMA  . History of concussion    02-05-2012--  fell and hit head (had previous falls prior to this day) dx mild concussion -- per MRI and neurologist note (dr Leonie Man)  right frontal lob w/ shear injuries consistant with fall/trauma  . History of Graves' disease    s/p  radiactive iodine therapy 1989  . History of primary hyperparathyroidism    W/  HYPERCALCEMIA--  s/p  left inferior parathyroidectomy 2014  . Hyperlipidemia   . Hypothyroidism,  postradioiodine therapy 1989  approx   endocrinologist-  dr Gaylene Brooks [facility]  . Increased urinary frequency   . Osteoporosis   . Type 2 diabetes mellitus ([place])      Past Surgical History:  Procedure Laterality Date  . CYSTOSCOPY N/A 12/19/2015   Procedure: Rolly Salter UNDER ANESTHESIA;  Surgeon: Kathie Rhodes, MD;  Location: [facility];  Service: Urology;  Laterality: N/A;  . PARATHYROIDECTOMY Left 03/27/2012   Procedure: Left Inferior Mininally Invasive PARATHYROIDECTOMY;  Surgeon: Earnstine Regal, MD;  Location: [facility];  Service: General;  Laterality: Left;  Left Inferior Mininally Invasive Parathyroidectomy---  hypercellular adenoma  . TRANSOBTURATOR SLING  09/01/2004  . transvaginal mesh placement    . TUBAL LIGATION  1978  . VAGINAL HYSTERECTOMY  1986    WITH OVARIAN PRESERVATION      Family History  Problem Relation Age of Onset  . Cancer Mother        MELANOMA  . Diabetes Maternal Aunt   . Heart attack Brother   . Diabetes Maternal Uncle      Social History   Substance and Sexual Activity  Drug Use No     Social History   Substance and Sexual Activity  Alcohol Use No  . Alcohol/week: 0.0 standard drinks     Social History   Tobacco Use  Smoking Status Former Smoker  . Packs/day: 1.00  . Years: 30.00  . Pack years: 30.00  . Types: Cigarettes  . Quit date: 03/18/1997  . Years since quitting: 22.0  Smokeless Tobacco Never Used     Outpatient Encounter Medications as of 03/17/2019  Medication Sig  . acetaminophen (TYLENOL) 500 MG tablet Take 500 mg by mouth every 6 (six) hours as needed. For pain  . calcipotriene-betamethasone (TACLONEX SCALP) external suspension Apply topically daily as needed.   . clobetasol cream (TEMOVATE) [id number] % Apply 1 application topically 2 (two) times daily as needed.   . cyclobenzaprine (FLEXERIL) 5 MG tablet Take 1 tablet (5 mg total) by mouth at bedtime. 1/2 to 1 tablet as needed at bedtime  . fluticasone (CUTIVATE) 0.05 % cream Apply topically 2 (two) times daily as needed.   Marland Kitchen FREESTYLE LITE test strip Use to check blood sugars every morning fasting and 2 hours after largest meal  . ketoconazole (NIZORAL) 2 % cream Apply 1 application topically daily as needed.   . Lancets (FREESTYLE) lancets Use to check blood  sugars every morning fasting and 2 hours after largest meal  . levothyroxine (SYNTHROID) 75 MCG tablet Take 1 tablet (75 mcg total) by mouth daily before breakfast.  . SEROQUEL 50 MG tablet Take 1 tablet by mouth at bedtime.  . [DISCONTINUED] Lancets (FREESTYLE) lancets Use to check blood sugars every morning fasting and 2 hours after largest meal   No facility-administered encounter medications on file as of 03/17/2019.    Allergies: Aspirin, Lipitor [atorvastatin calcium], Trimethoprim, Vivelle [estradiol], Zetia [ezetimibe], Zocor [simvastatin], and Sulfa antibiotics  Body mass index is 25.44 kg/m.  Blood pressure (!) 106/58, pulse 72, temperature 98.1 F (36.7 C), temperature source Oral, height 5' 1.56" (1.564 m), weight 137 lb 1.6 oz (62.2 kg), SpO2 100 %.    Review of Systems  Constitutional: Positive for fatigue. Negative for activity change, appetite change, chills, diaphoresis, fever and unexpected weight change.  HENT: Negative for congestion.   Eyes: Negative for visual disturbance.  Respiratory: Negative for cough, chest tightness, shortness of breath, wheezing and stridor.   Cardiovascular:  Negative for chest pain, palpitations and leg swelling.  Gastrointestinal: Negative for abdominal distention, abdominal pain, blood in stool, diarrhea and nausea.  Endocrine: Negative for polydipsia, polyphagia and polyuria.  Genitourinary: Negative for difficulty urinating and dysuria.  Neurological: Positive for dizziness. Negative for headaches.  Hematological: Negative for adenopathy. Does not bruise/bleed easily.  Psychiatric/Behavioral: Negative for behavioral problems, confusion, decreased concentration, dysphoric mood, hallucinations, self-injury, sleep disturbance and suicidal ideas. The patient is not nervous/anxious and is not hyperactive.        Objective:   Physical Exam Vitals and nursing note reviewed.  Constitutional:      General: She is not in acute distress.     Appearance: Normal appearance. She is normal weight. She is not ill-appearing, toxic-appearing or diaphoretic.  Eyes:     Extraocular Movements: Extraocular movements intact.     Conjunctiva/sclera: Conjunctivae normal.     Pupils: Pupils are equal, round, and reactive to light.  Cardiovascular:     Rate and Rhythm: Normal rate and regular rhythm.     Pulses: Normal pulses.     Heart sounds: Normal heart sounds. No murmur. No friction rub. No gallop.   Pulmonary:     Effort: Pulmonary effort is normal. No respiratory distress.     Breath sounds: Normal breath sounds. No stridor. No wheezing, rhonchi or rales.  Chest:     Chest wall: No tenderness.  Skin:    Capillary Refill: Capillary refill takes less than 2 seconds.  Neurological:     Mental Status: She is alert and oriented to person, place, and time.  Psychiatric:        Mood and Affect: Mood normal.        Behavior: Behavior normal.        Thought Content: Thought content normal.        Judgment: Judgment normal.       Assessment & Plan:   1. Diabetes mellitus due to non-steroid drug without complication ([place])   2. Hypothyroidism, unspecified type   3. Hyperlipidemia, unspecified hyperlipidemia type   4. Healthcare maintenance   5. Pre-diabetes   6. Bipolar 1 disorder, manic, moderate ([facility])   7. Decreased GFR     Healthcare maintenance A1c-6.0 today. Continue all medications as directed. Remain well hydrated, follow heart healthy diet. Recommend re-checking microalbumin in 6 months- your blood pressure is low and unsure if your will tolerate low dose Lisinopril. We will contact you with lab results. Continue close follow-up with your mental health care team. Please call wait list to see if you can schedule COVID-19 vaccine. Continue to social distance and wear a mask when in public. Follow-up with primary care in 3 months.  Hyperlipidemia Lipid Panel drawn today  Pre-diabetes Lab Results  Component Value Date    HGBA1C 6.0 (A) 03/17/2019   HGBA1C 6.1 (A) 09/10/2018   HGBA1C 6.2 (A) 03/11/2018  She reports AM BG 90-100s She is not on any anti-diabetic medications. Microalbumin- abnromal, however BP 106/58 She reports dizziness with position changes-concerned about her tolerating low dose ACE. Will re-check microalbumin in 6 months   Bipolar 1 disorder, manic, moderate She is followed by Psychiatry/Dr. Orpah Cobb- mood stable on Seroquel 50mg  QD  Decreased GFR CMP drawn today    FOLLOW-UP:  Return in about 3 months (around 06/14/2019) for Regular Follow Up, Hypercholestermia, A1c re-check.

## 2019-03-17 NOTE — Assessment & Plan Note (Signed)
Lipid Panel drawn today

## 2019-03-17 NOTE — Assessment & Plan Note (Signed)
A1c-6.0 today. Continue all medications as directed. Remain well hydrated, follow heart healthy diet. Recommend re-checking microalbumin in 6 months- your blood pressure is low and unsure if your will tolerate low dose Lisinopril. We will contact you with lab results. Continue close follow-up with your mental health care team. Please call wait list to see if you can schedule COVID-19 vaccine. Continue to social distance and wear a mask when in public. Follow-up with primary care in 3 months.

## 2019-03-17 NOTE — Assessment & Plan Note (Signed)
CMP drawn today 

## 2019-03-17 NOTE — Patient Instructions (Addendum)
Mediterranean Diet A Mediterranean diet refers to food and lifestyle choices that are based on the traditions of countries located on the [employer]. This way of eating has been shown to help prevent certain conditions and improve outcomes for people who have chronic diseases, like kidney disease and heart disease. What are tips for following this plan? Lifestyle  Cook and eat meals together with your family, when possible.  Drink enough fluid to keep your urine clear or pale yellow.  Be physically active every day. This includes: ? Aerobic exercise like running or swimming. ? Leisure activities like gardening, walking, or housework.  Get 7-8 hours of sleep each night.  If recommended by your health care provider, drink red wine in moderation. This means 1 glass a day for nonpregnant women and 2 glasses a day for men. A glass of wine equals 5 oz (150 mL). Reading food labels   Check the serving size of packaged foods. For foods such as rice and pasta, the serving size refers to the amount of cooked product, not dry.  Check the total fat in packaged foods. Avoid foods that have saturated fat or trans fats.  Check the ingredients list for added sugars, such as corn syrup. Shopping  At the grocery store, buy most of your food from the areas near the walls of the store. This includes: ? Fresh fruits and vegetables (produce). ? Grains, beans, nuts, and seeds. Some of these may be available in unpackaged forms or large amounts (in bulk). ? Fresh seafood. ? Poultry and eggs. ? Low-fat dairy products.  Buy whole ingredients instead of prepackaged foods.  Buy fresh fruits and vegetables in-season from local farmers markets.  Buy frozen fruits and vegetables in resealable bags.  If you do not have access to quality fresh seafood, buy precooked frozen shrimp or canned fish, such as tuna, salmon, or sardines.  Buy small amounts of raw or cooked vegetables, salads, or olives from  the deli or salad bar at your store.  Stock your pantry so you always have certain foods on hand, such as olive oil, canned tuna, canned tomatoes, rice, pasta, and beans. Cooking  Cook foods with extra-virgin olive oil instead of using butter or other vegetable oils.  Have meat as a side dish, and have vegetables or grains as your main dish. This means having meat in small portions or adding small amounts of meat to foods like pasta or stew.  Use beans or vegetables instead of meat in common dishes like chili or lasagna.  Experiment with different cooking methods. Try roasting or broiling vegetables instead of steaming or sauteing them.  Add frozen vegetables to soups, stews, pasta, or rice.  Add nuts or seeds for added healthy fat at each meal. You can add these to yogurt, salads, or vegetable dishes.  Marinate fish or vegetables using olive oil, lemon juice, garlic, and fresh herbs. Meal planning   Plan to eat 1 vegetarian meal one day each week. Try to work up to 2 vegetarian meals, if possible.  Eat seafood 2 or more times a week.  Have healthy snacks readily available, such as: ? Vegetable sticks with hummus. ? [place] yogurt. ? Fruit and nut trail mix.  Eat balanced meals throughout the week. This includes: ? Fruit: 2-3 servings a day ? Vegetables: 4-5 servings a day ? Low-fat dairy: 2 servings a day ? Fish, poultry, or lean meat: 1 serving a day ? Beans and legumes: 2 or more servings a week ?  Nuts and seeds: 1-2 servings a day ? Whole grains: 6-8 servings a day ? Extra-virgin olive oil: 3-4 servings a day  Limit red meat and sweets to only a few servings a month What are my food choices?  Mediterranean diet ? Recommended  Grains: Whole-grain pasta. Brown rice. Bulgar wheat. Polenta. Couscous. Whole-wheat bread. [place].  Vegetables: Artichokes. Beets. Broccoli. Cabbage. Carrots. Eggplant. Green beans. Chard. Kale. Spinach. Onions. Leeks. Peas. Squash.  Tomatoes. Peppers. Radishes.  Fruits: Apples. Apricots. Avocado. Berries. Bananas. Cherries. Dates. Figs. Grapes. Lemons. Melon. Oranges. Peaches. Plums. Pomegranate.  Meats and other protein foods: Beans. Almonds. Sunflower seeds. Pine nuts. Peanuts. [place]. Salmon. Scallops. Shrimp. [place]. Tilapia. Clams. Oysters. Eggs.  Dairy: Low-fat milk. Cheese. Greek yogurt.  Beverages: Water. Red wine. Herbal tea.  Fats and oils: Extra virgin olive oil. Avocado oil. Grape seed oil.  Sweets and desserts: [place] yogurt with honey. Baked apples. Poached pears. Trail mix.  Seasoning and other foods: Basil. Cilantro. Coriander. Cumin. Mint. Parsley. Sage. Rosemary. Tarragon. Garlic. Oregano. Thyme. Pepper. Balsalmic vinegar. Tahini. Hummus. Tomato sauce. Olives. Mushrooms. ? Limit these  Grains: Prepackaged pasta or rice dishes. Prepackaged cereal with added sugar.  Vegetables: Deep fried potatoes (french fries).  Fruits: Fruit canned in syrup.  Meats and other protein foods: Beef. Pork. Lamb. Poultry with skin. Hot dogs. Berniece Salines.  Dairy: Ice cream. Sour cream. Whole milk.  Beverages: Juice. Sugar-sweetened soft drinks. Beer. Liquor and spirits.  Fats and oils: Butter. Canola oil. Vegetable oil. Beef fat (tallow). Lard.  Sweets and desserts: Cookies. Cakes. Pies. Candy.  Seasoning and other foods: Mayonnaise. Premade sauces and marinades. The items listed may not be a complete list. Talk with your dietitian about what dietary choices are right for you. Summary  The Mediterranean diet includes both food and lifestyle choices.  Eat a variety of fresh fruits and vegetables, beans, nuts, seeds, and whole grains.  Limit the amount of red meat and sweets that you eat.  Talk with your health care provider about whether it is safe for you to drink red wine in moderation. This means 1 glass a day for nonpregnant women and 2 glasses a day for men. A glass of wine equals 5 oz (150 mL). This information  is not intended to replace advice given to you by your health care provider. Make sure you discuss any questions you have with your health care provider. Document Revised: 09/29/2015 Document Reviewed: 09/22/2015 Elsevier Patient Education  2020 [employer].  A1c-6.0 today. Continue all medications as directed. Remain well hydrated, follow heart healthy diet. Recommend re-checking microalbumin in 6 months- your blood pressure is low and unsure if your will tolerate low dose Lisinopril. We will contact you with lab results. Continue close follow-up with your mental health care team. Please call wait list to see if you can schedule COVID-19 vaccine. Continue to social distance and wear a mask when in public. Follow-up with primary care in 3 months. GREAT TO SEE YOU!

## 2019-03-18 ENCOUNTER — Other Ambulatory Visit: Payer: Self-pay | Admitting: Adult Health

## 2019-03-18 LAB — LIPID PANEL
Chol/HDL Ratio: 3.5 ratio (ref 0.0–4.4)
Cholesterol, Total: 209 mg/dL — ABNORMAL HIGH (ref 100–199)
HDL: 60 mg/dL (ref >39)
LDL Chol Calc (NIH): 132 mg/dL — ABNORMAL HIGH (ref 0–99)
Triglycerides: 97 mg/dL (ref 0–149)
VLDL Cholesterol Cal: 17 mg/dL (ref 5–40)

## 2019-03-18 LAB — COMPREHENSIVE METABOLIC PANEL
ALT: 35 IU/L — ABNORMAL HIGH (ref 0–32)
AST: 29 IU/L (ref 0–40)
Albumin/Globulin Ratio: 1.7 (ref 1.2–2.2)
Albumin: 4.5 g/dL (ref 3.7–4.7)
Alkaline Phosphatase: 83 IU/L (ref 39–117)
BUN/Creatinine Ratio: 17 (ref 12–28)
BUN: 22 mg/dL (ref 8–27)
Bilirubin Total: 0.5 mg/dL (ref 0.0–1.2)
CO2: 24 mmol/L (ref 20–29)
Calcium: 11.1 mg/dL — ABNORMAL HIGH (ref 8.7–10.3)
Chloride: 111 mmol/L — ABNORMAL HIGH (ref 96–106)
Creatinine, Ser: 1.29 mg/dL — ABNORMAL HIGH (ref 0.57–1.00)
GFR calc Af Amer: 48 mL/min/{1.73_m2} — ABNORMAL LOW (ref >59)
GFR calc non Af Amer: 41 mL/min/{1.73_m2} — ABNORMAL LOW (ref >59)
Globulin, Total: 2.6 g/dL (ref 1.5–4.5)
Glucose: 123 mg/dL — ABNORMAL HIGH (ref 65–99)
Potassium: 4.6 mmol/L (ref 3.5–5.2)
Sodium: 147 mmol/L — ABNORMAL HIGH (ref 134–144)
Total Protein: 7.1 g/dL (ref 6.0–8.5)

## 2019-03-18 LAB — TSH: TSH: 3.68 u[IU]/mL (ref 0.450–4.500)

## 2019-03-23 ENCOUNTER — Other Ambulatory Visit: Payer: Self-pay

## 2019-03-23 MED ORDER — FREESTYLE LITE TEST VI STRP: ORAL_STRIP | 11 refills | Status: DC

## 2019-03-31 DIAGNOSIS — F3174 Bipolar disorder, in full remission, most recent episode manic: Secondary | ICD-10-CM | POA: Diagnosis not present

## 2019-03-31 DIAGNOSIS — F5105 Insomnia due to other mental disorder: Secondary | ICD-10-CM | POA: Diagnosis not present

## 2019-04-10 ENCOUNTER — Encounter: Payer: Self-pay | Admitting: Adult Health

## 2019-04-21 ENCOUNTER — Encounter: Payer: Self-pay | Admitting: Adult Health

## 2019-05-05 ENCOUNTER — Encounter: Payer: Self-pay | Admitting: Adult Health

## 2019-06-10 ENCOUNTER — Encounter: Payer: Self-pay | Admitting: Adult Health

## 2019-06-10 MED ORDER — FREESTYLE LITE TEST VI STRP: ORAL_STRIP | 11 refills | Status: DC

## 2019-07-07 ENCOUNTER — Other Ambulatory Visit: Payer: Self-pay | Admitting: Adult Health

## 2019-07-07 DIAGNOSIS — Z1231 Encounter for screening mammogram for malignant neoplasm of breast: Secondary | ICD-10-CM

## 2019-07-07 NOTE — Progress Notes (Signed)
Acute Office Visit  Subjective:    Patient ID: [name], female    DOB: 05/12/1945, 74 y.o.   MRN: [id number]  Chief Complaint  Patient presents with  . Back Pain    HPI Patient is in today for low back pain x 2 weeks. States she bent over to pick up something and felt a "pulling" sensation in her lower back. She has tried Tylenol, Aspercreme, heat and ice which provided some relief. Standing makes the pain better and sitting makes the pain worse. Denies fever, numbness, tingling, bladder/bowel incontinence. Currently rates her pain as 6/10 and 8/10 when sitting or putting pressure on her low back.   Past Medical History:  Diagnosis Date  . Bipolar disorder ([place])   . Chills without fever    intermittant  . Depression   . History of colon polyps    2009 TUBULAR ADENOMA  . History of concussion    02-05-2012--  fell and hit head (had previous falls prior to this day) dx mild concussion -- per MRI and neurologist note (dr Leonie Man)  right frontal lob w/ shear injuries consistant with fall/trauma  . History of Graves' disease    s/p  radiactive iodine therapy 1989  . History of primary hyperparathyroidism    W/  HYPERCALCEMIA--  s/p  left inferior parathyroidectomy 2014  . Hyperlipidemia   . Hypothyroidism, postradioiodine therapy 1989  approx   endocrinologist-  dr Gaylene Brooks [facility]  . Increased urinary frequency   . Osteoporosis   . Type 2 diabetes mellitus ([place])     Past Surgical History:  Procedure Laterality Date  . CYSTOSCOPY N/A 12/19/2015   Procedure: Rolly Salter UNDER ANESTHESIA;  Surgeon: Kathie Rhodes, MD;  Location: [facility];  Service: Urology;  Laterality: N/A;  . PARATHYROIDECTOMY Left 03/27/2012   Procedure: Left Inferior Mininally Invasive PARATHYROIDECTOMY;  Surgeon: Earnstine Regal, MD;  Location: [facility];  Service: General;  Laterality: Left;  Left Inferior Mininally Invasive Parathyroidectomy---  hypercellular adenoma   . TRANSOBTURATOR SLING  09/01/2004  . transvaginal mesh placement    . TUBAL LIGATION  1978  . VAGINAL HYSTERECTOMY  1986    WITH OVARIAN PRESERVATION     Family History  Problem Relation Age of Onset  . Cancer Mother        MELANOMA  . Diabetes Maternal Aunt   . Heart attack Brother   . Diabetes Maternal Uncle     Social History   Socioeconomic History  . Marital status: Widowed    Spouse name: Not on file  . Number of children: Not on file  . Years of education: Not on file  . Highest education level: Not on file  Occupational History  . Not on file  Tobacco Use  . Smoking status: Former Smoker    Packs/day: 1.00    Years: 30.00    Pack years: 30.00    Types: Cigarettes    Quit date: 03/18/1997    Years since quitting: 22.3  . Smokeless tobacco: Never Used  Substance and Sexual Activity  . Alcohol use: No    Alcohol/week: 0.0 standard drinks  . Drug use: No  . Sexual activity: Not Currently    Birth control/protection: Surgical  Other Topics Concern  . Not on file  Social History Narrative  . Not on file   Social Determinants of Health   Financial Resource Strain:   . Difficulty of Paying Living Expenses:   Food Insecurity:   .  Worried About Charity fundraiser in the Last Year:   . Arboriculturist in the Last Year:   Transportation Needs:   . Film/video editor (Medical):   Marland Kitchen Lack of Transportation (Non-Medical):   Physical Activity:   . Days of Exercise per Week:   . Minutes of Exercise per Session:   Stress:   . Feeling of Stress :   Social Connections:   . Frequency of Communication with Friends and Family:   . Frequency of Social Gatherings with Friends and Family:   . Attends Religious Services:   . Active Member of Clubs or Organizations:   . Attends Archivist Meetings:   Marland Kitchen Marital Status:   Intimate Partner Violence:   . Fear of Current or Ex-Partner:   . Emotionally Abused:   Marland Kitchen Physically Abused:   . Sexually Abused:      Outpatient Medications Prior to Visit  Medication Sig Dispense Refill  . acetaminophen (TYLENOL) 500 MG tablet Take 500 mg by mouth every 6 (six) hours as needed. For pain    . calcipotriene-betamethasone (TACLONEX SCALP) external suspension Apply topically daily as needed.     . clobetasol cream (TEMOVATE) [id number] % Apply 1 application topically 2 (two) times daily as needed.     . fluticasone (CUTIVATE) 0.05 % cream Apply topically 2 (two) times daily as needed.     Marland Kitchen FREESTYLE LITE test strip Use to check blood sugars every morning fasting and 2 hours after largest meal 100 each 11  . ketoconazole (NIZORAL) 2 % cream Apply 1 application topically daily as needed.     . Lancets (FREESTYLE) lancets Use to check blood sugars every morning fasting and 2 hours after largest meal 100 each 11  . levothyroxine (SYNTHROID) 75 MCG tablet Take 1 tablet (75 mcg total) by mouth daily before breakfast. 90 tablet 3  . SEROQUEL 50 MG tablet Take 1 tablet by mouth at bedtime.     No facility-administered medications prior to visit.    Allergies  Allergen Reactions  . Aspirin Other (See Comments)    BLISTERS ON LIPS  . Lipitor [Atorvastatin Calcium] Other (See Comments)    Myalgia   . Trimethoprim Other (See Comments)    Unknown reaction  . Vivelle [Estradiol] Other (See Comments)    "heart flutters"  . Zetia [Ezetimibe] Other (See Comments)    Myalgias  . Zocor [Simvastatin] Other (See Comments)    myalgias  . Sulfa Antibiotics Rash    And blistering of lips    Review of Systems  A fourteen system review of systems was performed and found to be positive as per HPI.    Objective:    Physical Exam General: Well nourished, in no apparent distress. Eyes: PERRLA, EOMs, conjunctiva clr Resp: Respiratory effort- normal, ECTA B/L w/o W/R/R  Cardio: RRR w/o MRGs. Abdomen: no gross distention. Lymphatics:  less 2 sec cap RF M-sk: Good ROM, good strength, normal gait. No spine or SI joint  tenderness. Mild tenderness to palpation of lumbar paraspinal muscles.    Skin: Warm, dry  Neuro: Alert, Oriented Psych: Normal affect, Insight and Judgment appropriate.    BP (!) 107/55   Pulse (!) 57   Temp (!) 97.5 F (36.4 C) (Oral)   Ht 5' 1.5" (1.562 m)   Wt 133 lb 14.4 oz (60.7 kg)   SpO2 99% Comment: on RA  BMI 24.89 kg/m  Wt Readings from Last 3 Encounters:  07/08/19 133 lb  14.4 oz (60.7 kg)  03/17/19 137 lb 1.6 oz (62.2 kg)  09/10/18 134 lb 9.6 oz (61.1 kg)    There are no preventive care reminders to display for this patient.  There are no preventive care reminders to display for this patient.   Lab Results  Component Value Date   TSH 3.680 03/17/2019   Lab Results  Component Value Date   WBC 4.7 03/11/2018   HGB 13.0 03/11/2018   HCT 37.6 03/11/2018   MCV 88 03/11/2018   PLT 180 03/11/2018   Lab Results  Component Value Date   NA 147 (H) 03/17/2019   K 4.6 03/17/2019   CO2 24 03/17/2019   GLUCOSE 123 (H) 03/17/2019   BUN 22 03/17/2019   CREATININE 1.29 (H) 03/17/2019   BILITOT 0.5 03/17/2019   ALKPHOS 83 03/17/2019   AST 29 03/17/2019   ALT 35 (H) 03/17/2019   PROT 7.1 03/17/2019   ALBUMIN 4.5 03/17/2019   CALCIUM 11.1 (H) 03/17/2019   Lab Results  Component Value Date   CHOL 209 (H) 03/17/2019   Lab Results  Component Value Date   HDL 60 03/17/2019   Lab Results  Component Value Date   LDLCALC 132 (H) 03/17/2019   Lab Results  Component Value Date   TRIG 97 03/17/2019   Lab Results  Component Value Date   CHOLHDL 3.5 03/17/2019   Lab Results  Component Value Date   HGBA1C 6.0 (A) 03/17/2019       Assessment & Plan:   Problem List Items Addressed This Visit      Other   Acute bilateral low back pain without sciatica - Primary   Relevant Medications   Baclofen 5 MG TABS       Meds ordered this encounter  Medications  . Baclofen 5 MG TABS    Sig: Take 1 tablet by mouth twice daily as needed.    Dispense:  30  tablet    Refill:  0    Order Specific Question:   Supervising Provider    Answer:   Beatrice Lecher D [[id number]]    Acute bilateral low back pain w/o sciatica: - Pt's symptoms and mechanism of pain onset suggest possible muscular/lumbar strain. If pain doesn't improve in couple weeks advised to notify the office and will order lumbar x-ray for further evaluation.  - Continue Tylenol, alternating heat and cold, and Aspercreme.  - Discussed with patient risks vs benefits of muscle relaxer and she is agreeable to starting Baclofen (has less side effects).    Lorrene Reid, PA-C

## 2019-07-08 ENCOUNTER — Encounter: Payer: Self-pay | Admitting: Physician Assistant

## 2019-07-08 ENCOUNTER — Other Ambulatory Visit: Payer: Self-pay

## 2019-07-08 ENCOUNTER — Ambulatory Visit (INDEPENDENT_AMBULATORY_CARE_PROVIDER_SITE_OTHER): Payer: Medicare Other | Admitting: Physician Assistant

## 2019-07-08 VITALS — BP 107/55 | HR 57 | Temp 97.5°F | Ht 61.5 in | Wt 133.9 lb

## 2019-07-08 DIAGNOSIS — M545 Low back pain, unspecified: Secondary | ICD-10-CM

## 2019-07-08 MED ORDER — BACLOFEN 5 MG PO TABS: ORAL_TABLET | ORAL | 0 refills | Status: DC

## 2019-07-08 NOTE — Patient Instructions (Signed)
Muscle Strain  A muscle strain is an injury that occurs when a muscle is stretched beyond its normal length. Usually, a small number of muscle fibers are torn when this happens. There are three types of muscle strains. First-degree strains have the least amount of muscle fiber tearing and the least amount of pain. Second-degree and third-degree strains have more tearing and pain.  Usually, recovery from muscle strain takes 1-2 weeks. Complete healing normally takes 5-6 weeks.  What are the causes?  This condition is caused when a sudden, violent force is placed on a muscle and stretches it too far. This may occur with a fall, lifting, or sports.  What increases the risk?  This condition is more likely to develop in athletes and people who are physically active.  What are the signs or symptoms?  Symptoms of this condition include:  · Pain.  · Bruising.  · Swelling.  · Trouble using the muscle.  How is this diagnosed?  This condition is diagnosed based on a physical exam and your medical history. Tests may also be done, including an X-ray, ultrasound, or MRI.  How is this treated?  This condition is initially treated with PRICE therapy. This therapy involves:  · Protecting the muscle from being injured again.  · Resting the injured muscle.  · Icing the injured muscle.  · Applying pressure (compression) to the injured muscle. This may be done with a splint or elastic bandage.  · Raising (elevating) the injured muscle.  Your health care provider may also recommend medicine for pain.  Follow these instructions at home:  If you have a splint:  · Wear the splint as told by your health care provider. Remove it only as told by your health care provider.  · Loosen the splint if your fingers or toes tingle, become numb, or turn cold and blue.  · Keep the splint clean.  · If the splint is not waterproof:  ? Do not let it get wet.  ? Cover it with a watertight covering when you take a bath or a shower.  Managing pain, stiffness,  and swelling    · If directed, put ice on the injured area.  ? If you have a removable splint, remove it as told by your health care provider.  ? Put ice in a plastic bag.  ? Place a towel between your skin and the bag.  ? Leave the ice on for 20 minutes, 2-3 times a day.  · Move your fingers or toes often to avoid stiffness and to lessen swelling.  · Raise (elevate) the injured area above the level of your heart while you are sitting or lying down.  · Wear an elastic bandage as told by your health care provider. Make sure that it is not too tight.  General instructions  · Take over-the-counter and prescription medicines only as told by your health care provider.  · Restrict your activity and rest the injured muscle as told by your health care provider. Gentle movements may be allowed.  · If physical therapy was prescribed, do exercises as told by your health care provider.  · Do not put pressure on any part of the splint until it is fully hardened. This may take several hours.  · Do not use any products that contain nicotine or tobacco, such as cigarettes and e-cigarettes. These can delay bone healing. If you need help quitting, ask your health care provider.  · Ask your health care provider when it   You have more pain or swelling in the injured area. Get help right away if:  You have numbness or tingling or lose a lot of strength in the injured area. Summary  A muscle strain is an injury that occurs when a muscle is stretched beyond its normal length.  This condition is caused when a sudden, violent force is placed on a muscle and stretches it too far.  This condition is initially treated with PRICE therapy, which involves protecting, resting,  icing, compressing, and elevating.  Gentle movements may be allowed. If physical therapy was prescribed, do exercises as told by your health care provider. This information is not intended to replace advice given to you by your health care provider. Make sure you discuss any questions you have with your health care provider. Document Revised: 01/11/2017 Document Reviewed: 03/07/2016 [employer] Patient Education  2020 [employer]  

## 2019-07-28 DIAGNOSIS — F3174 Bipolar disorder, in full remission, most recent episode manic: Secondary | ICD-10-CM | POA: Diagnosis not present

## 2019-07-28 DIAGNOSIS — F5105 Insomnia due to other mental disorder: Secondary | ICD-10-CM | POA: Diagnosis not present

## 2019-08-20 ENCOUNTER — Other Ambulatory Visit: Payer: Self-pay

## 2019-08-20 ENCOUNTER — Other Ambulatory Visit: Payer: Self-pay | Admitting: Physician Assistant

## 2019-08-20 ENCOUNTER — Ambulatory Visit
Admission: RE | Admit: 2019-08-20 | Discharge: 2019-08-20 | Disposition: A | Discharge status: Home / Self Care | Payer: Medicare Other | Source: Ambulatory Visit | Attending: Adult Health | Admitting: Adult Health

## 2019-08-20 DIAGNOSIS — Z1231 Encounter for screening mammogram for malignant neoplasm of breast: Secondary | ICD-10-CM | POA: Diagnosis not present

## 2019-08-22 ENCOUNTER — Encounter: Payer: Self-pay | Admitting: Physician Assistant

## 2019-08-25 DIAGNOSIS — D485 Neoplasm of uncertain behavior of skin: Secondary | ICD-10-CM | POA: Diagnosis not present

## 2019-08-25 DIAGNOSIS — L438 Other lichen planus: Secondary | ICD-10-CM | POA: Diagnosis not present

## 2019-09-24 ENCOUNTER — Encounter: Payer: Self-pay | Admitting: Physician Assistant

## 2019-09-24 MED ORDER — FREESTYLE LITE TEST VI STRP: ORAL_STRIP | 11 refills | Status: DC

## 2019-09-29 ENCOUNTER — Ambulatory Visit (INDEPENDENT_AMBULATORY_CARE_PROVIDER_SITE_OTHER): Payer: Medicare Other | Admitting: Physician Assistant

## 2019-09-29 ENCOUNTER — Encounter: Payer: Self-pay | Admitting: Physician Assistant

## 2019-09-29 ENCOUNTER — Other Ambulatory Visit: Payer: Self-pay

## 2019-09-29 VITALS — HR 60 | Ht 62.0 in | Wt 134.0 lb

## 2019-09-29 DIAGNOSIS — Z0000 Encounter for general adult medical examination without abnormal findings: Secondary | ICD-10-CM

## 2019-09-29 DIAGNOSIS — E785 Hyperlipidemia, unspecified: Secondary | ICD-10-CM | POA: Diagnosis not present

## 2019-09-29 DIAGNOSIS — R7989 Other specified abnormal findings of blood chemistry: Secondary | ICD-10-CM

## 2019-09-29 DIAGNOSIS — E039 Hypothyroidism, unspecified: Secondary | ICD-10-CM | POA: Diagnosis not present

## 2019-09-29 DIAGNOSIS — E1122 Type 2 diabetes mellitus with diabetic chronic kidney disease: Secondary | ICD-10-CM | POA: Diagnosis not present

## 2019-09-29 NOTE — Progress Notes (Signed)
Virtual Visit via Telephone Note:  I connected with [name] by telephone and verified that I am speaking with the correct person using two identifiers.    I discussed the limitations, risks, security and privacy concerns for performing an evaluation and management service by telephone and the availability of in person appointments. The staff discussed with patient that there may be a patient responsible charge related to this service. The patient expressed understanding and agreed to proceed.   Location of Patient- Home Location of Provider- Office    Subjective:   [name] is a 74 y.o. female who presents for Medicare Annual (Subsequent) preventive examination.  Review of Systems    General:   No F/C, wt loss Pulm:   No DIB, SOB, pleuritic chest pain Card:  No CP, palpitations Abd:  No n/v/d or pain Ext:  No inc edema from baseline  Objective:    Today's Vitals   09/29/19 1412  Pulse: 60  Weight: 134 lb (60.8 kg)  Height: 5\' 2"  (1.575 m)   Body mass index is 24.51 kg/m.  Advanced Directives 04/23/2017 12/19/2015 03/24/2012 02/10/2012  Does Patient Have a Medical Advance Directive? Yes Yes Patient does not have advance directive;Patient would not like information Patient has advance directive, copy not in chart  Type of Advance Directive Living will Living will;Healthcare Power of [place] will  Copy of [place] in Chart? - - - Copy requested from family  Pre-existing out of facility DNR order (yellow form or pink MOST form) - - - No  Some encounter information is confidential and restricted. Go to Review Flowsheets activity to see all data.    Current Medications (verified) Outpatient Encounter Medications as of 09/29/2019  Medication Sig   acetaminophen (TYLENOL) 500 MG tablet Take 500 mg by mouth every 6 (six) hours as needed. For pain   calcipotriene-betamethasone (TACLONEX SCALP) external suspension Apply topically daily as  needed.    clobetasol cream (TEMOVATE) 8.36 % Apply 1 application topically 2 (two) times daily as needed.    fluticasone (CUTIVATE) 0.05 % cream Apply topically 2 (two) times daily as needed.    FREESTYLE LITE test strip Use to check blood sugars every morning fasting and 2 hours after largest meal   ketoconazole (NIZORAL) 2 % cream Apply 1 application topically daily as needed.    Lancets (FREESTYLE) lancets Use to check blood sugars every morning fasting and 2 hours after largest meal   levothyroxine (SYNTHROID) 75 MCG tablet Take 1 tablet (75 mcg total) by mouth daily before breakfast.   SEROQUEL 50 MG tablet Take 1 tablet by mouth at bedtime.   Baclofen 5 MG TABS Take 1 tablet by mouth twice daily as needed. (Patient not taking: Reported on 09/29/2019)   No facility-administered encounter medications on file as of 09/29/2019.    Allergies (verified) Aspirin, Lipitor [atorvastatin calcium], Trimethoprim, Vivelle [estradiol], Zetia [ezetimibe], Zocor [simvastatin], and Sulfa antibiotics   History: Past Medical History:  Diagnosis Date   Bipolar disorder ([place])    Cataract    Phreesia 09/27/2019   Chills without fever    intermittant   Depression    Diabetes mellitus without complication ([place])    Phreesia 09/27/2019   History of colon polyps    2009 TUBULAR ADENOMA   History of concussion    02-05-2012--  fell and hit head (had previous falls prior to this day) dx mild concussion -- per MRI and neurologist note (dr Leonie Man)  right frontal  lob w/ shear injuries consistant with fall/trauma   History of Graves' disease    s/p  radiactive iodine therapy 1989   History of primary hyperparathyroidism    W/  HYPERCALCEMIA--  s/p  left inferior parathyroidectomy 2014   Hyperlipidemia    Hypothyroidism, postradioiodine therapy 1989  approx   endocrinologist-  dr Gaylene Brooks [facility]   Increased urinary frequency    Osteoporosis    Thyroid disease     Phreesia 09/27/2019   Type 2 diabetes mellitus [facility])    Past Surgical History:  Procedure Laterality Date   ABDOMINAL HYSTERECTOMY N/A    Phreesia 09/27/2019   CHOLECYSTECTOMY N/A    Phreesia 09/27/2019   CYSTOSCOPY N/A 12/19/2015   Procedure: Rolly Salter UNDER ANESTHESIA;  Surgeon: Kathie Rhodes, MD;  Location: [place];  Service: Urology;  Laterality: N/A;   PARATHYROIDECTOMY Left 03/27/2012   Procedure: Left Inferior Mininally Invasive PARATHYROIDECTOMY;  Surgeon: Earnstine Regal, MD;  Location: [facility];  Service: General;  Laterality: Left;  Left Inferior Mininally Invasive Parathyroidectomy---  hypercellular adenoma   TRANSOBTURATOR SLING  09/01/2004   transvaginal mesh placement     TUBAL LIGATION  1978   VAGINAL HYSTERECTOMY  1986    WITH OVARIAN PRESERVATION    Family History  Problem Relation Age of Onset   Cancer Mother        MELANOMA   Diabetes Maternal Aunt    Heart attack Brother    Diabetes Maternal Uncle    Social History   Socioeconomic History   Marital status: Widowed    Spouse name: Not on file   Number of children: Not on file   Years of education: Not on file   Highest education level: Not on file  Occupational History   Not on file  Tobacco Use   Smoking status: Former Smoker    Packs/day: 1.00    Years: 30.00    Pack years: 30.00    Types: Cigarettes    Quit date: 03/18/1997    Years since quitting: 22.5   Smokeless tobacco: Never Used  Vaping Use   Vaping Use: Never used  Substance and Sexual Activity   Alcohol use: No    Alcohol/week: 0.0 standard drinks   Drug use: No   Sexual activity: Not Currently    Birth control/protection: Surgical  Other Topics Concern   Not on file  Social History Narrative   Not on file   Social Determinants of Health   Financial Resource Strain:    Difficulty of Paying Living Expenses:   Food Insecurity:    Worried About Charity fundraiser in the Last Year:     Arboriculturist in the Last Year:   Transportation Needs:    Film/video editor (Medical):    Lack of Transportation (Non-Medical):   Physical Activity:    Days of Exercise per Week:    Minutes of Exercise per Session:   Stress:    Feeling of Stress :   Social Connections:    Frequency of Communication with Friends and Family:    Frequency of Social Gatherings with Friends and Family:    Attends Religious Services:    Active Member of Clubs or Organizations:    Attends Archivist Meetings:    Marital Status:     Tobacco Counseling Counseling given: Not Answered    Diabetic?Yes     Activities of Daily Living In your present state of health, do you have  any difficulty performing the following activities: 09/29/2019  Hearing? N  Vision? N  Difficulty concentrating or making decisions? N  Walking or climbing stairs? N  Dressing or bathing? N  Doing errands, shopping? N  Some recent data might be hidden    Patient Care Team: Lorrene Reid, PA-C as PCP - General (Physician Assistant) Juanita Craver, MD as Consulting Physician (Gastroenterology) Anda Kraft, MD as Consulting Physician (Endocrinology) Allyn Kenner, MD (Dermatology) Terrance Mass, MD (Inactive) (Gynecology) Kathie Rhodes, MD as Consulting Physician (Urology) Katy Apo, MD as Consulting Physician (Ophthalmology) Chauncey Mann, MD as Referring Physician (Psychiatry)  Indicate any recent Medical Services you may have received from other than [facility] providers in the past year (date may be approximate).     Assessment:   This is a routine wellness examination for [name].  Hearing/Vision screen No exam data present  Dietary issues and exercise activities discussed: -Follow heart healthy diet and stay as active as possible. -Stay well hydrated.  Goals   None    Depression Screen PHQ 2/9 Scores 09/29/2019 07/08/2019 03/17/2019 09/10/2018 06/17/2018 03/11/2018 12/24/2017   PHQ - 2 Score 0 0 0 0 0 0 0  PHQ- 9 Score 0 0 0 0 0 0 0    Fall Risk Fall Risk  09/29/2019 03/11/2018 04/23/2017 10/11/2015  Falls in the past year? 0 0 No No  Comment - - - Emmi Telephone Survey: data to providers prior to load  Follow up Falls evaluation completed - - -    Any stairs in or around the home? No  If so, are there any without handrails? n/a Home free of loose throw rugs in walkways, pet beds, electrical cords, etc? Yes  Adequate lighting in your home to reduce risk of falls? Yes   ASSISTIVE DEVICES UTILIZED TO PREVENT FALLS:  Life alert? No  Use of a cane, walker or w/c? No  Grab bars in the bathroom? Yes  Shower chair or bench in shower? Yes  Elevated toilet seat or a handicapped toilet? No   TIMED UP AND GO:  Was the test performed? No.  Telehealth.   Cognitive Function: WNL     6CIT Screen 09/29/2019 03/11/2018  What Year? 0 points 0 points  What month? 0 points 0 points  What time? 0 points 0 points  Count back from 20 0 points 0 points  Months in reverse 0 points 0 points  Repeat phrase 0 points 2 points  Total Score 0 2    Immunizations Immunization History  Administered Date(s) Administered   Fluad Quad(high Dose 65+) 10/01/2018   Influenza, High Dose Seasonal PF 11/08/2017   Influenza-Unspecified 11/08/2017, 10/01/2018   Moderna SARS-COVID-2 Vaccination 03/27/2019, 04/24/2019   Pneumococcal Conjugate-13 04/01/2014   Pneumococcal Polysaccharide-23 04/23/2017   Tdap 01/14/2012, 02/09/2012   Zoster 10/13/2012   Zoster Recombinat (Shingrix) 10/18/2016, 12/18/2016    TDAP status: Up to date Flu Vaccine status: Up to date Pneumococcal vaccine status: Up to date Covid-19 vaccine status: Completed vaccines  Qualifies for Shingles Vaccine? Yes   Zostavax completed Yes   Shingrix Completed?: Yes  Screening Tests Health Maintenance  Topic Date Due   INFLUENZA VACCINE  09/13/2019   HEMOGLOBIN A1C  09/14/2019   OPHTHALMOLOGY EXAM   02/04/2020   FOOT EXAM  03/16/2020   URINE MICROALBUMIN  03/16/2020   MAMMOGRAM  08/19/2021   TETANUS/TDAP  02/08/2022   COLONOSCOPY  11/07/2027   DEXA SCAN  Completed   COVID-19 Vaccine  Completed   Hepatitis  C Screening  Completed   PNA vac Low Risk Adult  Completed    Health Maintenance  Health Maintenance Due  Topic Date Due   INFLUENZA VACCINE  09/13/2019   HEMOGLOBIN A1C  09/14/2019    Colorectal cancer screening: Completed 2019. Repeat every 5 years Mammogram status: Completed 08/20/2019. Repeat every year Bone Density- Completed 12/18/2017  Lung Cancer Screening: (Low Dose CT Chest recommended if Age 87-80 years, 30 pack-year currently smoking OR have quit w/in 15years.) does not qualify.   Lung Cancer Screening Referral:   Additional Screening:  Hepatitis C Screening: does qualify; Completed 03/11/2018  Vision Screening: Recommended annual ophthalmology exams for early detection of glaucoma and other disorders of the eye. Is the patient up to date with their annual eye exam?  Yes  Who is the provider or what is the name of the office in which the patient attends annual eye exams? Dr. Prudencio Burly If pt is not established with a provider, would they like to be referred to a provider to establish care? No .   Dental Screening: Recommended annual dental exams for proper oral hygiene  Community Resource Referral / Chronic Care Management: CRR required this visit?  No   CCM required this visit?  No      Plan:  -Continue current medication regimen. -Continue to follow up with Dr. Nicolasa Ducking for mood management. -Follow up in 6 months for reg OV: DM, HLD, Hypothyroid  I have personally reviewed and noted the following in the patients chart:    Medical and social history  Use of alcohol, tobacco or illicit drugs   Current medications and supplements  Functional ability and status  Nutritional status  Physical activity  Advanced directives  List of  other physicians  Hospitalizations, surgeries, and ER visits in previous 12 months  Vitals  Screenings to include cognitive, depression, and falls  Referrals and appointments  In addition, I have reviewed and discussed with patient certain preventive protocols, quality metrics, and best practice recommendations. A written personalized care plan for preventive services as well as general preventive health recommendations were provided to patient.

## 2019-10-01 ENCOUNTER — Other Ambulatory Visit: Payer: Medicare Other

## 2019-10-01 ENCOUNTER — Other Ambulatory Visit: Payer: Self-pay

## 2019-10-01 DIAGNOSIS — E039 Hypothyroidism, unspecified: Secondary | ICD-10-CM | POA: Diagnosis not present

## 2019-10-01 DIAGNOSIS — Z0000 Encounter for general adult medical examination without abnormal findings: Secondary | ICD-10-CM | POA: Diagnosis not present

## 2019-10-01 DIAGNOSIS — E1122 Type 2 diabetes mellitus with diabetic chronic kidney disease: Secondary | ICD-10-CM | POA: Diagnosis not present

## 2019-10-01 DIAGNOSIS — E785 Hyperlipidemia, unspecified: Secondary | ICD-10-CM

## 2019-10-01 DIAGNOSIS — R7989 Other specified abnormal findings of blood chemistry: Secondary | ICD-10-CM

## 2019-10-02 LAB — COMPREHENSIVE METABOLIC PANEL
ALT: 31 IU/L (ref 0–32)
AST: 23 IU/L (ref 0–40)
Albumin/Globulin Ratio: 1.7 (ref 1.2–2.2)
Albumin: 4.2 g/dL (ref 3.7–4.7)
Alkaline Phosphatase: 74 IU/L (ref 48–121)
BUN/Creatinine Ratio: 14 (ref 12–28)
BUN: 18 mg/dL (ref 8–27)
Bilirubin Total: 0.3 mg/dL (ref 0.0–1.2)
CO2: 23 mmol/L (ref 20–29)
Calcium: 10.5 mg/dL — ABNORMAL HIGH (ref 8.7–10.3)
Chloride: 110 mmol/L — ABNORMAL HIGH (ref 96–106)
Creatinine, Ser: 1.27 mg/dL — ABNORMAL HIGH (ref 0.57–1.00)
GFR calc Af Amer: 49 mL/min/{1.73_m2} — ABNORMAL LOW (ref >59)
GFR calc non Af Amer: 42 mL/min/{1.73_m2} — ABNORMAL LOW (ref >59)
Globulin, Total: 2.5 g/dL (ref 1.5–4.5)
Glucose: 124 mg/dL — ABNORMAL HIGH (ref 65–99)
Potassium: 4.4 mmol/L (ref 3.5–5.2)
Sodium: 144 mmol/L (ref 134–144)
Total Protein: 6.7 g/dL (ref 6.0–8.5)

## 2019-10-02 LAB — LIPID PANEL
Chol/HDL Ratio: 3.7 ratio (ref 0.0–4.4)
Cholesterol, Total: 192 mg/dL (ref 100–199)
HDL: 52 mg/dL (ref >39)
LDL Chol Calc (NIH): 124 mg/dL — ABNORMAL HIGH (ref 0–99)
Triglycerides: 86 mg/dL (ref 0–149)
VLDL Cholesterol Cal: 16 mg/dL (ref 5–40)

## 2019-10-02 LAB — CBC
Hematocrit: 38.7 % (ref 34.0–46.6)
Hemoglobin: 12.8 g/dL (ref 11.1–15.9)
MCH: 29.8 pg (ref 26.6–33.0)
MCHC: 33.1 g/dL (ref 31.5–35.7)
MCV: 90 fL (ref 79–97)
Platelets: 163 10*3/uL (ref 150–450)
RBC: 4.3 x10E6/uL (ref 3.77–5.28)
RDW: 12.6 % (ref 11.7–15.4)
WBC: 4.2 10*3/uL (ref 3.4–10.8)

## 2019-10-02 LAB — HEMOGLOBIN A1C
Est. average glucose Bld gHb Est-mCnc: 137 mg/dL
Hgb A1c MFr Bld: 6.4 % — ABNORMAL HIGH (ref 4.8–5.6)

## 2019-10-02 LAB — TSH: TSH: 1.98 u[IU]/mL (ref 0.450–4.500)

## 2019-10-21 ENCOUNTER — Telehealth: Payer: Self-pay | Admitting: Physician Assistant

## 2019-10-21 NOTE — Telephone Encounter (Signed)
Patient is requesting a call back from nurse to discuss some questions she has about the flu shot this year. Please contact when available.

## 2019-10-21 NOTE — Telephone Encounter (Signed)
Patient questioning if she can get flu shot this year bc she did receive covid vaccine. I advised patient last long as 14 days after 2nd covid vaccine per cdc then she could receive flu vaccine. Patient verbalized understanding. AS, CMA

## 2019-11-07 ENCOUNTER — Encounter: Payer: Self-pay | Admitting: Physician Assistant

## 2019-11-09 ENCOUNTER — Telehealth: Payer: Self-pay | Admitting: Physician Assistant

## 2019-11-09 MED ORDER — LEVOTHYROXINE SODIUM 75 MCG PO TABS: 75.0000 ug | ORAL_TABLET | Freq: Every day | ORAL | 3 refills | Status: DC

## 2019-11-09 MED ORDER — FREESTYLE LITE TEST VI STRP: ORAL_STRIP | 11 refills | Status: DC

## 2019-11-09 NOTE — Telephone Encounter (Signed)
Refill sent to pharmacy. AS, CMA 

## 2019-11-13 ENCOUNTER — Ambulatory Visit (INDEPENDENT_AMBULATORY_CARE_PROVIDER_SITE_OTHER): Payer: Medicare Other

## 2019-11-13 ENCOUNTER — Other Ambulatory Visit: Payer: Self-pay

## 2019-11-13 VITALS — BP 109/60 | HR 64 | Ht 62.0 in | Wt 131.5 lb

## 2019-11-13 DIAGNOSIS — Z23 Encounter for immunization: Secondary | ICD-10-CM | POA: Diagnosis not present

## 2019-11-13 NOTE — Progress Notes (Signed)
Patient in office today for flu vaccine. Patient fever free and denies allergies to flu vaccine or eggs. Patient tolerated vaccine well. VIS given. AS, CMA

## 2019-11-17 DIAGNOSIS — F3174 Bipolar disorder, in full remission, most recent episode manic: Secondary | ICD-10-CM | POA: Diagnosis not present

## 2019-11-17 DIAGNOSIS — F5105 Insomnia due to other mental disorder: Secondary | ICD-10-CM | POA: Diagnosis not present

## 2019-12-05 ENCOUNTER — Encounter: Payer: Self-pay | Admitting: Physician Assistant

## 2019-12-15 DIAGNOSIS — F3174 Bipolar disorder, in full remission, most recent episode manic: Secondary | ICD-10-CM | POA: Diagnosis not present

## 2019-12-15 DIAGNOSIS — F5105 Insomnia due to other mental disorder: Secondary | ICD-10-CM | POA: Diagnosis not present

## 2019-12-16 ENCOUNTER — Encounter: Payer: Self-pay | Admitting: Physician Assistant

## 2019-12-16 MED ORDER — FREESTYLE LITE TEST VI STRP: ORAL_STRIP | 11 refills | Status: DC

## 2020-01-20 ENCOUNTER — Encounter: Payer: Self-pay | Admitting: Physician Assistant

## 2020-01-20 MED ORDER — FREESTYLE LITE TEST VI STRP: ORAL_STRIP | 11 refills | Status: DC

## 2020-02-10 DIAGNOSIS — Z961 Presence of intraocular lens: Secondary | ICD-10-CM | POA: Diagnosis not present

## 2020-02-10 DIAGNOSIS — H5213 Myopia, bilateral: Secondary | ICD-10-CM | POA: Diagnosis not present

## 2020-02-10 DIAGNOSIS — E119 Type 2 diabetes mellitus without complications: Secondary | ICD-10-CM | POA: Diagnosis not present

## 2020-02-10 DIAGNOSIS — H524 Presbyopia: Secondary | ICD-10-CM | POA: Diagnosis not present

## 2020-02-10 LAB — HM DIABETES EYE EXAM

## 2020-02-16 ENCOUNTER — Encounter: Payer: Self-pay | Admitting: Physician Assistant

## 2020-02-18 DIAGNOSIS — F5105 Insomnia due to other mental disorder: Secondary | ICD-10-CM | POA: Diagnosis not present

## 2020-02-18 DIAGNOSIS — F3174 Bipolar disorder, in full remission, most recent episode manic: Secondary | ICD-10-CM | POA: Diagnosis not present

## 2020-02-20 ENCOUNTER — Encounter: Payer: Self-pay | Admitting: Physician Assistant

## 2020-02-21 ENCOUNTER — Other Ambulatory Visit: Payer: Self-pay | Admitting: Physician Assistant

## 2020-02-21 MED ORDER — FREESTYLE LITE TEST VI STRP: ORAL_STRIP | 11 refills | Status: DC

## 2020-02-23 DIAGNOSIS — L438 Other lichen planus: Secondary | ICD-10-CM | POA: Diagnosis not present

## 2020-03-13 ENCOUNTER — Encounter: Payer: Self-pay | Admitting: Physician Assistant

## 2020-03-26 ENCOUNTER — Encounter: Payer: Self-pay | Admitting: Physician Assistant

## 2020-03-28 MED ORDER — FREESTYLE LITE TEST VI STRP: ORAL_STRIP | 11 refills | Status: DC

## 2020-03-30 ENCOUNTER — Encounter: Payer: Self-pay | Admitting: Physician Assistant

## 2020-03-30 ENCOUNTER — Ambulatory Visit (INDEPENDENT_AMBULATORY_CARE_PROVIDER_SITE_OTHER): Payer: Medicare Other | Admitting: Physician Assistant

## 2020-03-30 ENCOUNTER — Other Ambulatory Visit: Payer: Self-pay

## 2020-03-30 VITALS — BP 107/60 | HR 79 | Temp 97.9°F | Ht 61.5 in | Wt 131.4 lb

## 2020-03-30 DIAGNOSIS — K2100 Gastro-esophageal reflux disease with esophagitis, without bleeding: Secondary | ICD-10-CM | POA: Insufficient documentation

## 2020-03-30 DIAGNOSIS — K5904 Chronic idiopathic constipation: Secondary | ICD-10-CM | POA: Insufficient documentation

## 2020-03-30 DIAGNOSIS — Z800 Family history of malignant neoplasm of digestive organs: Secondary | ICD-10-CM | POA: Insufficient documentation

## 2020-03-30 DIAGNOSIS — E1122 Type 2 diabetes mellitus with diabetic chronic kidney disease: Secondary | ICD-10-CM | POA: Diagnosis not present

## 2020-03-30 DIAGNOSIS — Z86010 Personal history of colonic polyps: Secondary | ICD-10-CM | POA: Insufficient documentation

## 2020-03-30 DIAGNOSIS — K7460 Unspecified cirrhosis of liver: Secondary | ICD-10-CM | POA: Insufficient documentation

## 2020-03-30 DIAGNOSIS — T466X5A Adverse effect of antihyperlipidemic and antiarteriosclerotic drugs, initial encounter: Secondary | ICD-10-CM

## 2020-03-30 DIAGNOSIS — K8020 Calculus of gallbladder without cholecystitis without obstruction: Secondary | ICD-10-CM | POA: Insufficient documentation

## 2020-03-30 DIAGNOSIS — K5900 Constipation, unspecified: Secondary | ICD-10-CM | POA: Insufficient documentation

## 2020-03-30 DIAGNOSIS — E039 Hypothyroidism, unspecified: Secondary | ICD-10-CM

## 2020-03-30 DIAGNOSIS — I951 Orthostatic hypotension: Secondary | ICD-10-CM | POA: Diagnosis not present

## 2020-03-30 DIAGNOSIS — K625 Hemorrhage of anus and rectum: Secondary | ICD-10-CM

## 2020-03-30 DIAGNOSIS — K760 Fatty (change of) liver, not elsewhere classified: Secondary | ICD-10-CM | POA: Insufficient documentation

## 2020-03-30 DIAGNOSIS — R634 Abnormal weight loss: Secondary | ICD-10-CM | POA: Insufficient documentation

## 2020-03-30 DIAGNOSIS — F319 Bipolar disorder, unspecified: Secondary | ICD-10-CM

## 2020-03-30 DIAGNOSIS — I5032 Chronic diastolic (congestive) heart failure: Secondary | ICD-10-CM | POA: Diagnosis not present

## 2020-03-30 DIAGNOSIS — K219 Gastro-esophageal reflux disease without esophagitis: Secondary | ICD-10-CM | POA: Insufficient documentation

## 2020-03-30 DIAGNOSIS — R933 Abnormal findings on diagnostic imaging of other parts of digestive tract: Secondary | ICD-10-CM | POA: Insufficient documentation

## 2020-03-30 DIAGNOSIS — E785 Hyperlipidemia, unspecified: Secondary | ICD-10-CM

## 2020-03-30 DIAGNOSIS — G720 Drug-induced myopathy: Secondary | ICD-10-CM

## 2020-03-30 HISTORY — DX: Hemorrhage of anus and rectum: K62.5

## 2020-03-30 LAB — POCT GLYCOSYLATED HEMOGLOBIN (HGB A1C): Hemoglobin A1C: 6.6 % — AB (ref 4.0–5.6)

## 2020-03-30 LAB — POCT UA - MICROALBUMIN
Albumin/Creatinine Ratio, Urine, POC: <30
Creatinine, POC: 50 mg/dL
Microalbumin Ur, POC: 10 mg/L

## 2020-03-30 NOTE — Assessment & Plan Note (Signed)
-  Last lipid panel: total cholesterol 198, triglycerides 111, HDL 50, LDL 124 -Patient has history of statin induced myopathy. Recommend to continue with monitoring diet and recommend reducing fried foods. Recommend to consider referral to lipid clinic if LDL fails to improve or worsen. -Increase physical activity. -Will continue to monitor and plan to repeat lipid panel next OV.

## 2020-03-30 NOTE — Assessment & Plan Note (Signed)
-  PHQ-9 score of 0. -Continue to follow up with Psychiatry. -On Seroquel.

## 2020-03-30 NOTE — Assessment & Plan Note (Signed)
-  Reviewed echocardiogram 02/11/2012:  Left ventricle: The cavity size was normal. Systolic  function was normal. The estimated ejection fraction was in  the range of 55% to 60%. Wall motion was normal; there were  no regional wall motion abnormalities. Doppler parameters  are consistent with abnormal left ventricular relaxation  (grade 1 diastolic dysfunction).

## 2020-03-30 NOTE — Patient Instructions (Signed)
Diabetes Mellitus and Nutrition, Adult When you have diabetes, or diabetes mellitus, it is very important to have healthy eating habits because your blood sugar (glucose) levels are greatly affected by what you eat and drink. Eating healthy foods in the right amounts, at about the same times every day, can help you:  Control your blood glucose.  Lower your risk of heart disease.  Improve your blood pressure.  Reach or maintain a healthy weight. What can affect my meal plan? Every person with diabetes is different, and each person has different needs for a meal plan. Your health care provider may recommend that you work with a dietitian to make a meal plan that is best for you. Your meal plan may vary depending on factors such as:  The calories you need.  The medicines you take.  Your weight.  Your blood glucose, blood pressure, and cholesterol levels.  Your activity level.  Other health conditions you have, such as heart or kidney disease. How do carbohydrates affect me? Carbohydrates, also called carbs, affect your blood glucose level more than any other type of food. Eating carbs naturally raises the amount of glucose in your blood. Carb counting is a method for keeping track of how many carbs you eat. Counting carbs is important to keep your blood glucose at a healthy level, especially if you use insulin or take certain oral diabetes medicines. It is important to know how many carbs you can safely have in each meal. This is different for every person. Your dietitian can help you calculate how many carbs you should have at each meal and for each snack. How does alcohol affect me? Alcohol can cause a sudden decrease in blood glucose (hypoglycemia), especially if you use insulin or take certain oral diabetes medicines. Hypoglycemia can be a life-threatening condition. Symptoms of hypoglycemia, such as sleepiness, dizziness, and confusion, are similar to symptoms of having too much  alcohol.  Do not drink alcohol if: ? Your health care provider tells you not to drink. ? You are pregnant, may be pregnant, or are planning to become pregnant.  If you drink alcohol: ? Do not drink on an empty stomach. ? Limit how much you use to:  0-1 drink a day for women.  0-2 drinks a day for men. ? Be aware of how much alcohol is in your drink. In the [place] one drink equals one 12 oz bottle of beer (355 mL), one 5 oz glass of wine (148 mL), or one 1 oz glass of hard liquor (44 mL). ? Keep yourself hydrated with water, diet soda, or unsweetened iced tea.  Keep in mind that regular soda, juice, and other mixers may contain a lot of sugar and must be counted as carbs. What are tips for following this plan? Reading food labels  Start by checking the serving size on the "Nutrition Facts" label of packaged foods and drinks. The amount of calories, carbs, fats, and other nutrients listed on the label is based on one serving of the item. Many items contain more than one serving per package.  Check the total grams (g) of carbs in one serving. You can calculate the number of servings of carbs in one serving by dividing the total carbs by 15. For example, if a food has 30 g of total carbs per serving, it would be equal to 2 servings of carbs.  Check the number of grams (g) of saturated fats and trans fats in one serving. Choose foods that have   a low amount or none of these fats.  Check the number of milligrams (mg) of salt (sodium) in one serving. Most people should limit total sodium intake to less than 2,300 mg per day.  Always check the nutrition information of foods labeled as "low-fat" or "nonfat." These foods may be higher in added sugar or refined carbs and should be avoided.  Talk to your dietitian to identify your daily goals for nutrients listed on the label. Shopping  Avoid buying canned, pre-made, or processed foods. These foods tend to be high in fat, sodium, and added  sugar.  Shop around the outside edge of the grocery store. This is where you will most often find fresh fruits and vegetables, bulk grains, fresh meats, and fresh dairy. Cooking  Use low-heat cooking methods, such as baking, instead of high-heat cooking methods like deep frying.  Cook using healthy oils, such as olive, canola, or sunflower oil.  Avoid cooking with butter, cream, or high-fat meats. Meal planning  Eat meals and snacks regularly, preferably at the same times every day. Avoid going long periods of time without eating.  Eat foods that are high in fiber, such as fresh fruits, vegetables, beans, and whole grains. Talk with your dietitian about how many servings of carbs you can eat at each meal.  Eat 4-6 oz (112-168 g) of lean protein each day, such as lean meat, chicken, fish, eggs, or tofu. One ounce (oz) of lean protein is equal to: ? 1 oz (28 g) of meat, chicken, or fish. ? 1 egg. ?  cup (62 g) of tofu.  Eat some foods each day that contain healthy fats, such as avocado, nuts, seeds, and fish.   What foods should I eat? Fruits Berries. Apples. Oranges. Peaches. Apricots. Plums. Grapes. Mango. Papaya. Pomegranate. Kiwi. Cherries. Vegetables Lettuce. Spinach. Leafy greens, including kale, chard, collard greens, and mustard greens. Beets. Cauliflower. Cabbage. Broccoli. Carrots. Green beans. Tomatoes. Peppers. Onions. Cucumbers. Brussels sprouts. Grains Whole grains, such as whole-wheat or whole-grain bread, crackers, tortillas, cereal, and pasta. Unsweetened oatmeal. Quinoa. Brown or wild rice. Meats and other proteins Seafood. Poultry without skin. Lean cuts of poultry and beef. Tofu. Nuts. Seeds. Dairy Low-fat or fat-free dairy products such as milk, yogurt, and cheese. The items listed above may not be a complete list of foods and beverages you can eat. Contact a dietitian for more information. What foods should I avoid? Fruits Fruits canned with  syrup. Vegetables Canned vegetables. Frozen vegetables with butter or cream sauce. Grains Refined white flour and flour products such as bread, pasta, snack foods, and cereals. Avoid all processed foods. Meats and other proteins Fatty cuts of meat. Poultry with skin. Breaded or fried meats. Processed meat. Avoid saturated fats. Dairy Full-fat yogurt, cheese, or milk. Beverages Sweetened drinks, such as soda or iced tea. The items listed above may not be a complete list of foods and beverages you should avoid. Contact a dietitian for more information. Questions to ask a health care provider  Do I need to meet with a diabetes educator?  Do I need to meet with a dietitian?  What number can I call if I have questions?  When are the best times to check my blood glucose? Where to find more information:  American Diabetes Association: diabetes.org  Academy of Nutrition and Dietetics: www.eatright.org  National Institute of Diabetes and Digestive and Kidney Diseases: www.niddk.nih.gov  Association of Diabetes Care and Education Specialists: www.diabeteseducator.org Summary  It is important to have healthy eating   habits because your blood sugar (glucose) levels are greatly affected by what you eat and drink.  A healthy meal plan will help you control your blood glucose and maintain a healthy lifestyle.  Your health care provider may recommend that you work with a dietitian to make a meal plan that is best for you.  Keep in mind that carbohydrates (carbs) and alcohol have immediate effects on your blood glucose levels. It is important to count carbs and to use alcohol carefully. This information is not intended to replace advice given to you by your health care provider. Make sure you discuss any questions you have with your health care provider. Document Revised: 01/06/2019 Document Reviewed: 01/06/2019 Elsevier Patient Education  2021 Elsevier Inc.  

## 2020-03-30 NOTE — Assessment & Plan Note (Signed)
>>  ASSESSMENT AND PLAN FOR BIPOLAR 1 DISORDER ([facility]CC) WRITTEN ON 03/30/2020 12:56 PM BY ABONZA, MARITZA, PA-C  -PHQ-9 score of 0. -Continue to follow up with Psychiatry. -On Seroquel.

## 2020-03-30 NOTE — Progress Notes (Signed)
Established Patient Office Visit  Subjective:  Patient ID: [name], female    DOB: 1946/01/02  Age: 75 y.o. MRN: [id number]  CC:  Chief Complaint  Patient presents with  . Diabetes  . Thyroid Problem  . Hyperlipidemia  . Hypertension    HPI [name] presents for follow up on diabetes mellitus, hypotension, hyperlipidemia and hypothyroid.  Diabetes: Pt denies increased urination or thirst. Pt continues to monitor carbohydrates and sweets. When feels like her sugar is low will have something sweet to eat.  Checking glucose at home. FBS range in 100s. Reports some increased stress with tax season and has not been able to walk due to the cold weather.   Hypotension: Denies chest pain, palpitations, shortness of breath or edema. Sometimes gets dizzy. Reports good hydration.  HLD: Pt has history of statin intolerance due to severe myalgias. Has tried 3-4 different medications. Has not seen lipid clinic. Reports follow a low red meat diet but likes fried foods. Use to grill and broil which she has not done frequently lately.   Hypothyroid: Asymptomatic.  Bipolar disorder: Continues to see Dr. Nicolasa Ducking, Psychiatry.   Past Medical History:  Diagnosis Date  . Bipolar disorder ([place])   . Cataract    Phreesia 09/27/2019  . Chills without fever    intermittant  . Depression   . Diabetes mellitus without complication ([place])    Phreesia 09/27/2019  . History of colon polyps    2009 TUBULAR ADENOMA  . History of concussion    02-05-2012--  fell and hit head (had previous falls prior to this day) dx mild concussion -- per MRI and neurologist note (dr Leonie Man)  right frontal lob w/ shear injuries consistant with fall/trauma  . History of Graves' disease    s/p  radiactive iodine therapy 1989  . History of primary hyperparathyroidism    W/  HYPERCALCEMIA--  s/p  left inferior parathyroidectomy 2014  . Hyperlipidemia   . Hypothyroidism, postradioiodine therapy 1989  approx    endocrinologist-  dr Gaylene Brooks [facility]  . Increased urinary frequency   . Osteoporosis   . Thyroid disease    Phreesia 09/27/2019  . Type 2 diabetes mellitus ([place])     Past Surgical History:  Procedure Laterality Date  . ABDOMINAL HYSTERECTOMY N/A    Phreesia 09/27/2019  . CHOLECYSTECTOMY N/A    Phreesia 09/27/2019  . CYSTOSCOPY N/A 12/19/2015   Procedure: Rolly Salter UNDER ANESTHESIA;  Surgeon: Kathie Rhodes, MD;  Location: [facility];  Service: Urology;  Laterality: N/A;  . PARATHYROIDECTOMY Left 03/27/2012   Procedure: Left Inferior Mininally Invasive PARATHYROIDECTOMY;  Surgeon: Earnstine Regal, MD;  Location: [facility];  Service: General;  Laterality: Left;  Left Inferior Mininally Invasive Parathyroidectomy---  hypercellular adenoma  . TRANSOBTURATOR SLING  09/01/2004  . transvaginal mesh placement    . TUBAL LIGATION  1978  . VAGINAL HYSTERECTOMY  1986    WITH OVARIAN PRESERVATION     Family History  Problem Relation Age of Onset  . Cancer Mother        MELANOMA  . Diabetes Maternal Aunt   . Heart attack Brother   . Diabetes Maternal Uncle     Social History   Socioeconomic History  . Marital status: Widowed    Spouse name: Not on file  . Number of children: Not on file  . Years of education: Not on file  . Highest education level: Not on file  Occupational History  .  Not on file  Tobacco Use  . Smoking status: Former Smoker    Packs/day: 1.00    Years: 30.00    Pack years: 30.00    Types: Cigarettes    Quit date: 03/18/1997    Years since quitting: 23.0  . Smokeless tobacco: Never Used  Vaping Use  . Vaping Use: Never used  Substance and Sexual Activity  . Alcohol use: No    Alcohol/week: 0.0 standard drinks  . Drug use: No  . Sexual activity: Not Currently    Birth control/protection: Surgical  Other Topics Concern  . Not on file  Social History Narrative  . Not on file   Social Determinants of Health   Financial  Resource Strain: Not on file  Food Insecurity: Not on file  Transportation Needs: Not on file  Physical Activity: Not on file  Stress: Not on file  Social Connections: Not on file  Intimate Partner Violence: Not on file    Outpatient Medications Prior to Visit  Medication Sig Dispense Refill  . acetaminophen (TYLENOL) 500 MG tablet Take 500 mg by mouth every 6 (six) hours as needed. For pain    . Baclofen 5 MG TABS Take 1 tablet by mouth twice daily as needed. 30 tablet 0  . calcipotriene-betamethasone (TACLONEX SCALP) external suspension Apply topically daily as needed.     . clobetasol cream (TEMOVATE) 3.29 % Apply 1 application topically 2 (two) times daily as needed.     . fluticasone (CUTIVATE) 0.05 % cream Apply topically 2 (two) times daily as needed.     Marland Kitchen FREESTYLE LITE test strip Use to check blood sugars every morning fasting and 2 hours after largest meal 100 each 11  . ketoconazole (NIZORAL) 2 % cream Apply 1 application topically daily as needed.     . Lancets (FREESTYLE) lancets Use to check blood sugars every morning fasting and 2 hours after largest meal 100 each 11  . levothyroxine (SYNTHROID) 75 MCG tablet Take 1 tablet (75 mcg total) by mouth daily before breakfast. 90 tablet 3  . SEROQUEL 50 MG tablet Take 1 tablet by mouth at bedtime.     No facility-administered medications prior to visit.    Allergies  Allergen Reactions  . Aspirin Other (See Comments)    BLISTERS ON LIPS  . Lipitor [Atorvastatin Calcium] Other (See Comments)    Myalgia   . Trimethoprim Other (See Comments)    Unknown reaction  . Vivelle [Estradiol] Other (See Comments)    "heart flutters"  . Zetia [Ezetimibe] Other (See Comments)    Myalgias  . Zocor [Simvastatin] Other (See Comments)    myalgias  . Sulfa Antibiotics Rash    And blistering of lips    ROS Review of Systems A fourteen system review of systems was performed and found to be positive as per HPI.  Objective:     Physical Exam General:  Well Developed, well nourished, appropriate for stated age.  Neuro:  Alert and oriented,  extra-ocular muscles intact  HEENT:  Normocephalic, atraumatic, neck supple Skin:  no gross rash, warm, pink. Cardiac:  RRR, S1 S2 Respiratory:  ECTA B/L w.o wheezing, crackles or rales, Not using accessory muscles, speaking in full sentences- unlabored. Vascular:  Ext warm, no cyanosis apprec.; cap RF less 2 sec. Psych:  No HI/SI, judgement and insight good, Euthymic mood. Full Affect.  BP 107/60   Pulse 79   Temp 97.9 F (36.6 C)   Ht 5' 1.5" (1.562 m)  Wt 131 lb 6.4 oz (59.6 kg)   SpO2 99%   BMI 24.43 kg/m  Wt Readings from Last 3 Encounters:  03/30/20 131 lb 6.4 oz (59.6 kg)  11/13/19 131 lb 8 oz (59.6 kg)  09/29/19 134 lb (60.8 kg)     Health Maintenance Due  Topic Date Due  . FOOT EXAM  03/16/2020    There are no preventive care reminders to display for this patient.  Lab Results  Component Value Date   TSH 1.980 10/01/2019   Lab Results  Component Value Date   WBC 4.2 10/01/2019   HGB 12.8 10/01/2019   HCT 38.7 10/01/2019   MCV 90 10/01/2019   PLT 163 10/01/2019   Lab Results  Component Value Date   NA 144 10/01/2019   K 4.4 10/01/2019   CO2 23 10/01/2019   GLUCOSE 124 (H) 10/01/2019   BUN 18 10/01/2019   CREATININE 1.27 (H) 10/01/2019   BILITOT 0.3 10/01/2019   ALKPHOS 74 10/01/2019   AST 23 10/01/2019   ALT 31 10/01/2019   PROT 6.7 10/01/2019   ALBUMIN 4.2 10/01/2019   CALCIUM 10.5 (H) 10/01/2019   Lab Results  Component Value Date   CHOL 192 10/01/2019   Lab Results  Component Value Date   HDL 52 10/01/2019   Lab Results  Component Value Date   LDLCALC 124 (H) 10/01/2019   Lab Results  Component Value Date   TRIG 86 10/01/2019   Lab Results  Component Value Date   CHOLHDL 3.7 10/01/2019   Lab Results  Component Value Date   HGBA1C 6.6 (A) 03/30/2020      Assessment & Plan:   Problem List Items Addressed  This Visit      Cardiovascular and Mediastinum   Chronic diastolic CHF (congestive heart failure) ([place]) (Chronic)    -Reviewed echocardiogram 02/11/2012:  Left ventricle: The cavity size was normal. Systolic  function was normal. The estimated ejection fraction was in  the range of 55% to 60%. Wall motion was normal; there were  no regional wall motion abnormalities. Doppler parameters  are consistent with abnormal left ventricular relaxation  (grade 1 diastolic dysfunction).       Hypotension, postural    -Stable. -Recommend to continue to stay hydrated and slowly get up from sitting to standing position.        Endocrine   Type 2 diabetes mellitus (HCC) - Primary (Chronic)    - A1c today is 6.6, increased from 6.1. - Discussed with patient will continue to manage with diet and lifestyle modifications. Will repeat A1c in 3 months and if A1c fails to improve will start medication therapy such as Metformin. Patient verbalized understanding. Recommend to increase physical activity weather permitting.  -Continue ambulatory glucose monitoring and notify clinic if FBS consistently <80 or >160. -Will continue to monitor.       Relevant Orders   POCT glycosylated hemoglobin (Hb A1C) (Completed)   POCT UA - Microalbumin (Completed)   Hypothyroidism    -Last TSH 1.980, wnl. -Continue current medication regimen. -Will continue to monitor and repeat TSH next OV.        Other   Hyperlipidemia (Chronic)    -Last lipid panel: total cholesterol 198, triglycerides 111, HDL 50, LDL 124 -Patient has history of statin induced myopathy. Recommend to continue with monitoring diet and recommend reducing fried foods. Recommend to consider referral to lipid clinic if LDL fails to improve or worsen. -Increase physical activity. -Will continue to monitor and  plan to repeat lipid panel next OV.      Bipolar 1 disorder (HCC)    -PHQ-9 score of 0. -Continue to follow up with Psychiatry. -On  Seroquel.        Other Visit Diagnoses    Statin myopathy           No orders of the defined types were placed in this encounter.   Follow-up: Return in about 3 months (around 06/27/2020) for DM, Thyroid, HLD and FBW 1 wk prior .    Lorrene Reid, PA-C

## 2020-03-30 NOTE — Assessment & Plan Note (Signed)
-  Last TSH 1.980, wnl. -Continue current medication regimen. -Will continue to monitor and repeat TSH next OV.

## 2020-03-30 NOTE — Assessment & Plan Note (Signed)
-  Stable. -Recommend to continue to stay hydrated and slowly get up from sitting to standing position.

## 2020-03-30 NOTE — Assessment & Plan Note (Signed)
-   A1c today is 6.6, increased from 6.1. - Discussed with patient will continue to manage with diet and lifestyle modifications. Will repeat A1c in 3 months and if A1c fails to improve will start medication therapy such as Metformin. Patient verbalized understanding. Recommend to increase physical activity weather permitting.  -Continue ambulatory glucose monitoring and notify clinic if FBS consistently <80 or >160. -Will continue to monitor.

## 2020-04-28 ENCOUNTER — Encounter: Payer: Self-pay | Admitting: Physician Assistant

## 2020-04-28 ENCOUNTER — Other Ambulatory Visit: Payer: Self-pay | Admitting: Physician Assistant

## 2020-04-28 DIAGNOSIS — E1122 Type 2 diabetes mellitus with diabetic chronic kidney disease: Secondary | ICD-10-CM

## 2020-04-28 MED ORDER — FREESTYLE LITE TEST VI STRP: ORAL_STRIP | 11 refills | Status: DC

## 2020-05-20 DIAGNOSIS — F5105 Insomnia due to other mental disorder: Secondary | ICD-10-CM | POA: Diagnosis not present

## 2020-05-20 DIAGNOSIS — F3174 Bipolar disorder, in full remission, most recent episode manic: Secondary | ICD-10-CM | POA: Diagnosis not present

## 2020-05-20 DIAGNOSIS — Z79899 Other long term (current) drug therapy: Secondary | ICD-10-CM | POA: Diagnosis not present

## 2020-05-29 ENCOUNTER — Encounter: Payer: Self-pay | Admitting: Physician Assistant

## 2020-05-29 DIAGNOSIS — E1122 Type 2 diabetes mellitus with diabetic chronic kidney disease: Secondary | ICD-10-CM

## 2020-05-30 MED ORDER — FREESTYLE LITE TEST VI STRP: ORAL_STRIP | 11 refills | Status: DC

## 2020-06-21 ENCOUNTER — Other Ambulatory Visit: Payer: Self-pay

## 2020-06-21 ENCOUNTER — Other Ambulatory Visit: Payer: Medicare Other

## 2020-06-21 DIAGNOSIS — E1122 Type 2 diabetes mellitus with diabetic chronic kidney disease: Secondary | ICD-10-CM | POA: Diagnosis not present

## 2020-06-21 DIAGNOSIS — Z0000 Encounter for general adult medical examination without abnormal findings: Secondary | ICD-10-CM

## 2020-06-21 DIAGNOSIS — E785 Hyperlipidemia, unspecified: Secondary | ICD-10-CM

## 2020-06-21 DIAGNOSIS — I951 Orthostatic hypotension: Secondary | ICD-10-CM

## 2020-06-21 DIAGNOSIS — E039 Hypothyroidism, unspecified: Secondary | ICD-10-CM | POA: Diagnosis not present

## 2020-06-22 LAB — COMPREHENSIVE METABOLIC PANEL
ALT: 27 IU/L (ref 0–32)
AST: 24 IU/L (ref 0–40)
Albumin/Globulin Ratio: 2.3 — ABNORMAL HIGH (ref 1.2–2.2)
Albumin: 4.5 g/dL (ref 3.7–4.7)
Alkaline Phosphatase: 74 IU/L (ref 44–121)
BUN/Creatinine Ratio: 15 (ref 12–28)
BUN: 19 mg/dL (ref 8–27)
Bilirubin Total: 0.5 mg/dL (ref 0.0–1.2)
CO2: 23 mmol/L (ref 20–29)
Calcium: 10.5 mg/dL — ABNORMAL HIGH (ref 8.7–10.3)
Chloride: 111 mmol/L — ABNORMAL HIGH (ref 96–106)
Creatinine, Ser: 1.31 mg/dL — ABNORMAL HIGH (ref 0.57–1.00)
Globulin, Total: 2 g/dL (ref 1.5–4.5)
Glucose: 128 mg/dL — ABNORMAL HIGH (ref 65–99)
Potassium: 4.3 mmol/L (ref 3.5–5.2)
Sodium: 145 mmol/L — ABNORMAL HIGH (ref 134–144)
Total Protein: 6.5 g/dL (ref 6.0–8.5)
eGFR: 43 mL/min/{1.73_m2} — ABNORMAL LOW (ref >59)

## 2020-06-22 LAB — CBC
Hematocrit: 39.3 % (ref 34.0–46.6)
Hemoglobin: 13 g/dL (ref 11.1–15.9)
MCH: 29.5 pg (ref 26.6–33.0)
MCHC: 33.1 g/dL (ref 31.5–35.7)
MCV: 89 fL (ref 79–97)
Platelets: 147 10*3/uL — ABNORMAL LOW (ref 150–450)
RBC: 4.4 x10E6/uL (ref 3.77–5.28)
RDW: 12.7 % (ref 11.7–15.4)
WBC: 3.8 10*3/uL (ref 3.4–10.8)

## 2020-06-22 LAB — LIPID PANEL
Chol/HDL Ratio: 3.4 ratio (ref 0.0–4.4)
Cholesterol, Total: 195 mg/dL (ref 100–199)
HDL: 57 mg/dL (ref >39)
LDL Chol Calc (NIH): 121 mg/dL — ABNORMAL HIGH (ref 0–99)
Triglycerides: 96 mg/dL (ref 0–149)
VLDL Cholesterol Cal: 17 mg/dL (ref 5–40)

## 2020-06-22 LAB — HEMOGLOBIN A1C
Est. average glucose Bld gHb Est-mCnc: 137 mg/dL
Hgb A1c MFr Bld: 6.4 % — ABNORMAL HIGH (ref 4.8–5.6)

## 2020-06-22 LAB — TSH: TSH: 0.69 u[IU]/mL (ref 0.450–4.500)

## 2020-06-28 ENCOUNTER — Encounter: Payer: Self-pay | Admitting: Physician Assistant

## 2020-06-28 ENCOUNTER — Other Ambulatory Visit: Payer: Self-pay

## 2020-06-28 ENCOUNTER — Ambulatory Visit (INDEPENDENT_AMBULATORY_CARE_PROVIDER_SITE_OTHER): Payer: Medicare Other | Admitting: Physician Assistant

## 2020-06-28 VITALS — BP 107/57 | HR 64 | Temp 97.8°F | Ht 62.0 in | Wt 128.1 lb

## 2020-06-28 DIAGNOSIS — E039 Hypothyroidism, unspecified: Secondary | ICD-10-CM

## 2020-06-28 DIAGNOSIS — E785 Hyperlipidemia, unspecified: Secondary | ICD-10-CM

## 2020-06-28 DIAGNOSIS — K7460 Unspecified cirrhosis of liver: Secondary | ICD-10-CM | POA: Diagnosis not present

## 2020-06-28 DIAGNOSIS — E1122 Type 2 diabetes mellitus with diabetic chronic kidney disease: Secondary | ICD-10-CM

## 2020-06-28 DIAGNOSIS — E210 Primary hyperparathyroidism: Secondary | ICD-10-CM

## 2020-06-28 DIAGNOSIS — N1832 Chronic kidney disease, stage 3b: Secondary | ICD-10-CM | POA: Diagnosis not present

## 2020-06-28 DIAGNOSIS — D696 Thrombocytopenia, unspecified: Secondary | ICD-10-CM

## 2020-06-28 NOTE — Assessment & Plan Note (Signed)
-  Reviewed prior records from [facility] internal medicine.  -Patient has CKD so question if hyperparathyroidism secondary to CKD. Will continue to monitor. -Recent calcium stable.

## 2020-06-28 NOTE — Patient Instructions (Signed)

## 2020-06-28 NOTE — Assessment & Plan Note (Signed)
-  Followed by Gastroenterology. -Recent hepatic function wnl's. -Continue to avoid alcohol and reduce fatty foods.

## 2020-06-28 NOTE — Progress Notes (Signed)
Established Patient Office Visit  Subjective:  Patient ID: [name], female    DOB: 1945/08/27  Age: 75 y.o. MRN: [id number]  CC:  Chief Complaint  Patient presents with  . Follow-up  . Hyperlipidemia  . Thyroid Problem  . Diabetes    HPI [name] presents for follow up on diabetes mellitus, hyperlipidemia and hypothyroidism. Has no acute concerns today.  Diabetes: Pt denies increased urination or thirst. No hypoglycemic events. Checking glucose at home. Bring sugar log with readings ranging from 90-127. States continues to monitor carbohydrates and sugar intake. Has been more active.  HLD: Pt has history of statin intolerance due to side effects and liver function abnormality. Has changed her diet and reduced fried foods. Is broiling and grilling again.   Hypothyroidism: Patient denies fatigue, palpitations, heat/cold intolerance or bowel changes. Taking medication as directed.  CKD: Patient reports has not seen a kidney specialist. Was told CKD was related to some medications she was on in the past.  Primary hyperparathyroidism: Patient reports previous PCP ([place] internist) was monitoring parathyroid.  Cirrhosis: Followed by gastroenterology, Dr. Tammi Klippel.  Patient reports in the past was on Depakote which increased her liver enzymes, once medication was discontinued her liver enzymes normalized.  Denies history of alcohol abuse.  Past Medical History:  Diagnosis Date  . Bipolar disorder ([place])   . Cataract    Phreesia 09/27/2019  . Chills without fever    intermittant  . Depression   . Diabetes mellitus without complication ([place])    Phreesia 09/27/2019  . History of colon polyps    2009 TUBULAR ADENOMA  . History of concussion    02-05-2012--  fell and hit head (had previous falls prior to this day) dx mild concussion -- per MRI and neurologist note (dr Leonie Man)  right frontal lob w/ shear injuries consistant with fall/trauma  . History of Graves' disease     s/p  radiactive iodine therapy 1989  . History of primary hyperparathyroidism    W/  HYPERCALCEMIA--  s/p  left inferior parathyroidectomy 2014  . Hyperlipidemia   . Hypothyroidism, postradioiodine therapy 1989  approx   endocrinologist-  dr Gaylene Brooks [facility]  . Increased urinary frequency   . Osteoporosis   . Thyroid disease    Phreesia 09/27/2019  . Type 2 diabetes mellitus ([place])     Past Surgical History:  Procedure Laterality Date  . ABDOMINAL HYSTERECTOMY N/A    Phreesia 09/27/2019  . CHOLECYSTECTOMY N/A    Phreesia 09/27/2019  . CYSTOSCOPY N/A 12/19/2015   Procedure: Rolly Salter UNDER ANESTHESIA;  Surgeon: Kathie Rhodes, MD;  Location: [facility];  Service: Urology;  Laterality: N/A;  . PARATHYROIDECTOMY Left 03/27/2012   Procedure: Left Inferior Mininally Invasive PARATHYROIDECTOMY;  Surgeon: Earnstine Regal, MD;  Location: [facility];  Service: General;  Laterality: Left;  Left Inferior Mininally Invasive Parathyroidectomy---  hypercellular adenoma  . TRANSOBTURATOR SLING  09/01/2004  . transvaginal mesh placement    . TUBAL LIGATION  1978  . VAGINAL HYSTERECTOMY  1986    WITH OVARIAN PRESERVATION     Family History  Problem Relation Age of Onset  . Cancer Mother        MELANOMA  . Diabetes Maternal Aunt   . Heart attack Brother   . Diabetes Maternal Uncle     Social History   Socioeconomic History  . Marital status: Widowed    Spouse name: Not on file  . Number of children: Not  on file  . Years of education: Not on file  . Highest education level: Not on file  Occupational History  . Not on file  Tobacco Use  . Smoking status: Former Smoker    Packs/day: 1.00    Years: 30.00    Pack years: 30.00    Types: Cigarettes    Quit date: 03/18/1997    Years since quitting: 23.2  . Smokeless tobacco: Never Used  Vaping Use  . Vaping Use: Never used  Substance and Sexual Activity  . Alcohol use: No    Alcohol/week: 0.0 standard  drinks  . Drug use: No  . Sexual activity: Not Currently    Birth control/protection: Surgical  Other Topics Concern  . Not on file  Social History Narrative  . Not on file   Social Determinants of Health   Financial Resource Strain: Not on file  Food Insecurity: Not on file  Transportation Needs: Not on file  Physical Activity: Not on file  Stress: Not on file  Social Connections: Not on file  Intimate Partner Violence: Not on file    Outpatient Medications Prior to Visit  Medication Sig Dispense Refill  . acetaminophen (TYLENOL) 500 MG tablet Take 500 mg by mouth every 6 (six) hours as needed. For pain    . Baclofen 5 MG TABS Take 1 tablet by mouth twice daily as needed. 30 tablet 0  . calcipotriene-betamethasone (TACLONEX SCALP) external suspension Apply topically daily as needed.     . clobetasol cream (TEMOVATE) 6.21 % Apply 1 application topically 2 (two) times daily as needed.     . fluticasone (CUTIVATE) 0.05 % cream Apply topically 2 (two) times daily as needed.     Marland Kitchen FREESTYLE LITE test strip Use to check blood sugars every morning fasting and 2 hours after largest meal 100 each 11  . ketoconazole (NIZORAL) 2 % cream Apply 1 application topically daily as needed.     . Lancets (FREESTYLE) lancets Use to check blood sugars every morning fasting and 2 hours after largest meal 100 each 11  . levothyroxine (SYNTHROID) 75 MCG tablet Take 1 tablet (75 mcg total) by mouth daily before breakfast. 90 tablet 3  . SEROQUEL 50 MG tablet Take 1 tablet by mouth at bedtime.     No facility-administered medications prior to visit.    Allergies  Allergen Reactions  . Aspirin Other (See Comments)    BLISTERS ON LIPS  . Lipitor [Atorvastatin Calcium] Other (See Comments)    Myalgia   . Trimethoprim Other (See Comments)    Unknown reaction  . Vivelle [Estradiol] Other (See Comments)    "heart flutters"  . Zetia [Ezetimibe] Other (See Comments)    Myalgias  . Zocor [Simvastatin]  Other (See Comments)    myalgias  . Sulfa Antibiotics Rash    And blistering of lips    ROS Review of Systems Review of Systems:  A fourteen system review of systems was performed and found to be positive as per HPI.    Objective:    Physical Exam General:  Well Developed, well nourished, in no acute distress  Neuro:  Alert and oriented,  extra-ocular muscles intact  HEENT:  Normocephalic, atraumatic, neck supple Skin:  no gross rash, warm, pink. Cardiac:  RRR, S1 S2 Respiratory:  ECTA B/L and A/P, Not using accessory muscles, speaking in full sentences- unlabored. Vascular:  Ext warm, no cyanosis apprec.; cap RF less 2 sec. Psych:  No HI/SI, judgement and insight good, Euthymic  mood. Full Affect.   BP (!) 107/57   Pulse 64   Temp 97.8 F (36.6 C)   Ht _0  (1.575 m)   Wt 128 lb 1.6 oz (58.1 kg)   SpO2 98%   BMI 23.43 kg/m  Wt Readings from Last 3 Encounters:  06/28/20 128 lb 1.6 oz (58.1 kg)  03/30/20 131 lb 6.4 oz (59.6 kg)  11/13/19 131 lb 8 oz (59.6 kg)     There are no preventive care reminders to display for this patient.  There are no preventive care reminders to display for this patient.  Lab Results  Component Value Date   TSH 0.690 06/21/2020   Lab Results  Component Value Date   WBC 3.8 06/21/2020   HGB 13.0 06/21/2020   HCT 39.3 06/21/2020   MCV 89 06/21/2020   PLT 147 (L) 06/21/2020   Lab Results  Component Value Date   NA 145 (H) 06/21/2020   K 4.3 06/21/2020   CO2 23 06/21/2020   GLUCOSE 128 (H) 06/21/2020   BUN 19 06/21/2020   CREATININE 1.31 (H) 06/21/2020   BILITOT 0.5 06/21/2020   ALKPHOS 74 06/21/2020   AST 24 06/21/2020   ALT 27 06/21/2020   PROT 6.5 06/21/2020   ALBUMIN 4.5 06/21/2020   CALCIUM 10.5 (H) 06/21/2020   EGFR 43 (L) 06/21/2020   Lab Results  Component Value Date   CHOL 195 06/21/2020   Lab Results  Component Value Date   HDL 57 06/21/2020   Lab Results  Component Value Date   LDLCALC 121 (H)  06/21/2020   Lab Results  Component Value Date   TRIG 96 06/21/2020   Lab Results  Component Value Date   CHOLHDL 3.4 06/21/2020   Lab Results  Component Value Date   HGBA1C 6.4 (H) 06/21/2020      Assessment & Plan:   Problem List Items Addressed This Visit      Digestive   Cirrhosis, nonalcoholic ([place])    -Followed by Gastroenterology. -Recent hepatic function wnl's. -Continue to avoid alcohol and reduce fatty foods.        Endocrine   Type 2 diabetes mellitus (HCC) - Primary (Chronic)    -Recent A1c 6.4, has improved from 6.6 and is at goal. -Continue with low carbohydrate and glucose diet. -Continue to stay as active as possible. -Will continue to monitor.      Hypothyroidism    -Recent TSH wnl's. Continue current medication regimen. -Patient is s/p radioactive iodine for Grave's diease.  -Will continue to monitor.      Primary hyperparathyroidism ([place])    -Reviewed prior records from [facility] internal medicine.  -Patient has CKD so question if hyperparathyroidism secondary to CKD. Will continue to monitor. -Recent calcium stable.        Other   Hyperlipidemia (Chronic)    -Recent lipid panel stable, HDL 57, LDL 121 -Patient unable to tolerate statins and with history of cirrhosis recommend to continue with diet changes and follow a heart healthy diet. Continue to stay as active as possible. -Will continue to monitor.       Other Visit Diagnoses    Stage 3b chronic kidney disease ([place])       Thrombocytopenia ([place])         Stage 3b CKD: -Recent CMP: Cr 1.31, GFR 43 stable -Recommend to avoid NSAIDs. -Will continue to monitor. If renal function further declines recommend referral to Nephrology.   Thrombocytopenia: -Recent CBC: platelets 147, mildly decreased. -Will continue  to monitor and repeat at follow up visit. Per lab review, patient has a history mildly low platelets (10/09/2016).  No orders of the defined types were placed in this  encounter.   Follow-up: Return in about 3 months (around 09/28/2020) for MCW and FBW (lipid panel, cmp, cbc w/ diff, A1c).   Note:  This note was prepared with assistance of Dragon voice recognition software. Occasional wrong-word or sound-a-like substitutions may have occurred due to the inherent limitations of voice recognition software.  Lorrene Reid, PA-C

## 2020-06-28 NOTE — Assessment & Plan Note (Signed)
-  Recent A1c 6.4, has improved from 6.6 and is at goal. -Continue with low carbohydrate and glucose diet. -Continue to stay as active as possible. -Will continue to monitor.

## 2020-06-28 NOTE — Assessment & Plan Note (Signed)
-  Recent lipid panel stable, HDL 57, LDL 121 -Patient unable to tolerate statins and with history of cirrhosis recommend to continue with diet changes and follow a heart healthy diet. Continue to stay as active as possible. -Will continue to monitor.

## 2020-06-28 NOTE — Assessment & Plan Note (Addendum)
-  Recent TSH wnl's. Continue current medication regimen. -Patient is s/p radioactive iodine for Grave's diease.  -Will continue to monitor.

## 2020-06-29 ENCOUNTER — Encounter: Payer: Self-pay | Admitting: Physician Assistant

## 2020-06-29 DIAGNOSIS — E1122 Type 2 diabetes mellitus with diabetic chronic kidney disease: Secondary | ICD-10-CM

## 2020-06-30 MED ORDER — FREESTYLE LITE TEST VI STRP: ORAL_STRIP | 11 refills | Status: DC

## 2020-07-06 ENCOUNTER — Other Ambulatory Visit: Payer: Self-pay | Admitting: Physician Assistant

## 2020-07-06 DIAGNOSIS — Z1231 Encounter for screening mammogram for malignant neoplasm of breast: Secondary | ICD-10-CM

## 2020-07-27 ENCOUNTER — Ambulatory Visit (INDEPENDENT_AMBULATORY_CARE_PROVIDER_SITE_OTHER): Payer: Medicare Other | Admitting: Physician Assistant

## 2020-07-27 ENCOUNTER — Telehealth: Payer: Self-pay | Admitting: Physician Assistant

## 2020-07-27 ENCOUNTER — Encounter: Payer: Self-pay | Admitting: Physician Assistant

## 2020-07-27 DIAGNOSIS — J329 Chronic sinusitis, unspecified: Secondary | ICD-10-CM

## 2020-07-27 DIAGNOSIS — J310 Chronic rhinitis: Secondary | ICD-10-CM

## 2020-07-27 MED ORDER — FLUTICASONE PROPIONATE 50 MCG/ACT NA SUSP: 1.0000 | Freq: Every day | NASAL | 0 refills | Status: DC

## 2020-07-27 NOTE — Telephone Encounter (Signed)
Please make a 1:40 telehealth appointment for this patient.

## 2020-07-27 NOTE — Patient Instructions (Signed)
Valere Dross &amp; Nadel's Textbook of Respiratory Medicine (7th ed., pp. [phone]). Elsevier.">  Postnasal Drip Postnasal drip is the feeling of mucus going down the back of your throat. Mucus is a slimy substance that moistens and cleans your nose and throat, as well as the air pockets in face bones near your forehead and cheeks (sinuses). Small amounts of mucus pass from your nose and sinuses down the back of your throat all the time. This is normal. When you produce too much mucus or themucus gets too thick, you can feel it. Some common causes of postnasal drip include: Having more mucus because of: A cold or the flu. Allergies. Cold air. Certain medicines. Having more mucus that is thicker because of: A sinus or nasal infection. Dry air. A food allergy. Follow these instructions at home: Relieving discomfort  Gargle with a salt-water mixture 3-4 times a day or as needed. To make a salt-water mixture, completely dissolve -1 tsp of salt in 1 cup of warm water. If the air in your home is dry, use a humidifier to add moisture to the air. Use a saline spray or container (neti pot) to flush out the nose (nasal irrigation). These methods can help clear away mucus and keep the nasal passages moist.  General instructions Take over-the-counter and prescription medicines only as told by your health care provider. Follow instructions from your health care provider about eating or drinking restrictions. You may need to avoid caffeine. Avoid things that you know you are allergic to (allergens), like dust, mold, pollen, pets, or certain foods. Drink enough fluid to keep your urine pale yellow. Keep all follow-up visits as told by your health care provider. This is important. Contact a health care provider if: You have a fever. You have a sore throat. You have difficulty swallowing. You have headache. You have sinus pain. You have a cough that does not go away. The mucus from your nose becomes thick  and is green or yellow in color. You have cold or flu symptoms that last more than 10 days. Summary Postnasal drip is the feeling of mucus going down the back of your throat. If your health care provider approves, use nasal irrigation or a nasal spray 2?4 times a day. Avoid things that you know you are allergic to (allergens), like dust, mold, pollen, pets, or certain foods. This information is not intended to replace advice given to you by your health care provider. Make sure you discuss any questions you have with your healthcare provider. Document Revised: 11/10/2019 Document Reviewed: 11/10/2019 Elsevier Patient Education  2022 [employer].

## 2020-07-27 NOTE — Progress Notes (Signed)
Telehealth office visit note for Barbara Reid, PA-C- at [facility] at [facility]   I connected with current patient today by telephone and verified that I am speaking with the correct person    Location of the patient: Home  Location of the provider: Office - This visit type was conducted due to national recommendations for restrictions regarding the COVID-19 Pandemic (e.g. social distancing) in an effort to limit this patient's exposure and mitigate transmission in our community.    - No physical exam could be performed with this format, beyond that communicated to [place] by the patient/ family members as noted.   - Additionally my office staff/ schedulers were to discuss with the patient that there may be a monetary charge related to this service, depending on their medical insurance.  My understanding is that patient understood and consented to proceed.     _________________________________________________________________________________   History of Present Illness: Patient calls in with c/o sore throat. Patient reports initially had a dry cough which has resolved. Has mild frontal sinus pressure and goes from a runny nose to stuffy nose. Does have some postnasal drainage at night. Did an at-home Covid test today which was negative. Symptoms started 2 days ago. Has tried equate 24 hr allergy medication and warm salt gargles. Denies fever, chills, shortness of breath, headache, or swollen lymph nodes.     GAD 7 : Generalized Anxiety Score 07/27/2020  Nervous, Anxious, on Edge 0  Control/stop worrying 0  Worry too much - different things 0  Trouble relaxing 0  Restless 0  Easily annoyed or irritable 0  Afraid - awful might happen 0  Total GAD 7 Score 0    Depression screen [facility] 2/9 07/27/2020 06/28/2020 03/30/2020 09/29/2019 07/08/2019  Decreased Interest 0 0 0 0 0  Down, Depressed, Hopeless 0 0 0 0 0  PHQ - 2 Score 0 0 0 0 0  Altered sleeping 0 0 0 0 0  Tired, decreased  energy 0 0 0 0 0  Change in appetite 0 0 0 0 0  Feeling bad or failure about yourself  0 0 0 0 0  Trouble concentrating 0 0 0 0 0  Moving slowly or fidgety/restless 0 0 0 0 0  Suicidal thoughts 0 0 0 0 0  PHQ-9 Score 0 0 0 0 0  Some recent data might be hidden      Impression and Recommendations:     1. Rhinosinusitis     Rhinosinusitis: -Discussed with patient symptoms <1 week so likely viral and recommend supportive care, antibiotic therapy not indicated at this time. Less suspicious for strep pharyngitis (absence of fever and LAD). Recommend to use nasal spray before bedtime, warm liquids/tea, chloraseptic sore throat spray, and continue with oral antihistamine and salt water gargles. Advised to let me know if symptoms fail to improve or worsen and will consider antibiotic therapy.     - As part of my medical decision making, I reviewed the following data within the [place] History obtained from pt /family, CMA notes reviewed and incorporated if applicable, Labs reviewed, Radiograph/ tests reviewed if applicable and OV notes from prior OV's with me, as well as any other specialists she/he has seen since seeing me last, were all reviewed and used in my medical decision making process today.    - Additionally, when appropriate, discussion had with patient regarding our treatment plan, and their biases/concerns about that plan were used in my  medical decision making today.    - The patient agreed with the plan and demonstrated an understanding of the instructions.   No barriers to understanding were identified.     - The patient was advised to call back or seek an in-person evaluation if the symptoms worsen or if the condition fails to improve as anticipated.   Return if symptoms worsen or fail to improve.    No orders of the defined types were placed in this encounter.   Meds ordered this encounter  Medications   fluticasone (FLONASE) 50 MCG/ACT nasal spray     Sig: Place 1 spray into both nostrils daily.    Dispense:  16 g    Refill:  0    Order Specific Question:   Supervising Provider    Answer:   Beatrice Lecher D [[id number]]    There are no discontinued medications.     Time spent on visit including pre-visit chart review and post-visit care was 10 minutes.      The [place] was signed into law in 2016 which includes the topic of electronic health records.  This provides immediate access to information in MyChart.  This includes consultation notes, operative notes, office notes, lab results and pathology reports.  If you have any questions about what you read please let us know at your next visit or call us at the office.  We are right here with you.   __________________________________________________________________________________     Patient Care Team    Relationship Specialty Notifications Start End  [name], [place] PCP - General Physician Assistant  07/08/19   Juanita Craver, MD Consulting Physician Gastroenterology  04/23/17   Anda Kraft, MD Consulting Physician Endocrinology  04/23/17   Allyn Kenner, MD  Dermatology  04/23/17   Terrance Mass, MD (Inactive)  Gynecology  04/23/17   Kathie Rhodes, MD (Inactive) Consulting Physician Urology  04/23/17   Katy Apo, MD Consulting Physician Ophthalmology  04/23/17   Chauncey Mann, MD Referring Physician Psychiatry  04/23/17      -Vitals obtained; medications/ allergies reconciled;  personal medical, social, Sx etc.histories were updated by CMA, reviewed by me and are reflected in chart   Patient Active Problem List   Diagnosis Date Noted   Abnormal weight loss 03/30/2020   Cholelithiasis without obstruction 03/30/2020   Chronic idiopathic constipation 03/30/2020   Cirrhosis, nonalcoholic ([place]) 16/08/3708   Constipation 03/30/2020   Family history of malignant neoplasm of gastrointestinal tract 03/30/2020   Fatty liver 03/30/2020    Gastroesophageal reflux disease 03/30/2020   Gastro-esophageal reflux disease with esophagitis 03/30/2020   Imaging of gastrointestinal tract abnormal 03/30/2020   Personal history of colonic polyps 03/30/2020   Rectal bleeding 03/30/2020   Pre-diabetes 09/10/2018   Muscle spasm 06/17/2018   Acute bilateral low back pain without sciatica 06/17/2018   Disease of thyroid gland 11/25/2017   Diabetes mellitus due to non-steroid drug without complication ([employer]) 62/69/4854   Healthcare maintenance 04/23/2017   Type 2 diabetes mellitus ([place]) 04/23/2017   Hyperlipidemia 04/23/2017   Decreased GFR 04/23/2017   Elevated LFTs 04/23/2017   Osteoporosis 08/23/2015   Primary hyperparathyroidism ([place]) 03/27/2012   Abdominal pain    Hyponatremia 02/13/2012   Chronic diastolic CHF (congestive heart failure) ([place]) 02/13/2012   Hypothyroidism 02/11/2012   CVA (cerebral infarction) 02/10/2012   Ataxia 02/10/2012   Bipolar 1 disorder ([place]) 02/10/2012   Hypotension, postural 02/09/2012   Bipolar 1 disorder, manic, moderate ([place]) 02/05/2012  Current Meds  Medication Sig   acetaminophen (TYLENOL) 500 MG tablet Take 500 mg by mouth every 6 (six) hours as needed. For pain   Baclofen 5 MG TABS Take 1 tablet by mouth twice daily as needed.   calcipotriene-betamethasone (TACLONEX SCALP) external suspension Apply topically daily as needed.    clobetasol cream (TEMOVATE) 6.64 % Apply 1 application topically 2 (two) times daily as needed.    fluticasone (CUTIVATE) 0.05 % cream Apply topically 2 (two) times daily as needed.    fluticasone (FLONASE) 50 MCG/ACT nasal spray Place 1 spray into both nostrils daily.   FREESTYLE LITE test strip Use to check blood sugars every morning fasting and 2 hours after largest meal   ketoconazole (NIZORAL) 2 % cream Apply 1 application topically daily as needed.    Lancets (FREESTYLE) lancets Use to check blood sugars every morning fasting and 2 hours after largest meal    levothyroxine (SYNTHROID) 75 MCG tablet Take 1 tablet (75 mcg total) by mouth daily before breakfast.   SEROQUEL 50 MG tablet Take 1 tablet by mouth at bedtime.     Allergies:  Allergies  Allergen Reactions   Aspirin Other (See Comments)    BLISTERS ON LIPS   Lipitor [Atorvastatin Calcium] Other (See Comments)    Myalgia    Trimethoprim Other (See Comments)    Unknown reaction   Vivelle [Estradiol] Other (See Comments)    "heart flutters"   Zetia [Ezetimibe] Other (See Comments)    Myalgias   Zocor [Simvastatin] Other (See Comments)    myalgias   Sulfa Antibiotics Rash    And blistering of lips     ROS:  See above HPI for pertinent positives and negatives   Objective:   There were no vitals taken for this visit.   (if some vitals are omitted, this means that patient was UNABLE to obtain them. ) General: A & O * 3; sounds in no acute distress Respiratory: speaking in full sentences, no conversational dyspnea Psych: insight appears good, mood- appears full

## 2020-07-27 NOTE — Telephone Encounter (Signed)
Patient has a sore throat and a stuffy nose. Patient has no fever. Patient has not been tested for COVID due her tests not arriving yet. Patient asked if something can be called in. Please advise, thanks.

## 2020-09-02 ENCOUNTER — Other Ambulatory Visit: Payer: Self-pay

## 2020-09-02 ENCOUNTER — Ambulatory Visit
Admission: RE | Admit: 2020-09-02 | Discharge: 2020-09-02 | Disposition: A | Discharge status: Home / Self Care | Payer: Medicare Other | Source: Ambulatory Visit | Attending: Physician Assistant | Admitting: Physician Assistant

## 2020-09-02 DIAGNOSIS — Z1231 Encounter for screening mammogram for malignant neoplasm of breast: Secondary | ICD-10-CM

## 2020-09-22 ENCOUNTER — Encounter: Payer: Self-pay | Admitting: Physician Assistant

## 2020-09-22 ENCOUNTER — Other Ambulatory Visit: Payer: Self-pay | Admitting: Physician Assistant

## 2020-09-22 DIAGNOSIS — J310 Chronic rhinitis: Secondary | ICD-10-CM

## 2020-09-22 DIAGNOSIS — J329 Chronic sinusitis, unspecified: Secondary | ICD-10-CM

## 2020-09-23 ENCOUNTER — Other Ambulatory Visit: Payer: Self-pay | Admitting: Physician Assistant

## 2020-09-23 DIAGNOSIS — E785 Hyperlipidemia, unspecified: Secondary | ICD-10-CM

## 2020-09-23 DIAGNOSIS — E1122 Type 2 diabetes mellitus with diabetic chronic kidney disease: Secondary | ICD-10-CM

## 2020-09-23 DIAGNOSIS — R7989 Other specified abnormal findings of blood chemistry: Secondary | ICD-10-CM

## 2020-09-23 DIAGNOSIS — N1832 Chronic kidney disease, stage 3b: Secondary | ICD-10-CM

## 2020-09-24 ENCOUNTER — Other Ambulatory Visit: Payer: Self-pay | Admitting: Physician Assistant

## 2020-09-24 DIAGNOSIS — J310 Chronic rhinitis: Secondary | ICD-10-CM

## 2020-09-24 DIAGNOSIS — J329 Chronic sinusitis, unspecified: Secondary | ICD-10-CM

## 2020-09-26 ENCOUNTER — Other Ambulatory Visit: Payer: Self-pay

## 2020-09-26 ENCOUNTER — Other Ambulatory Visit: Payer: Medicare Other

## 2020-09-26 DIAGNOSIS — F5105 Insomnia due to other mental disorder: Secondary | ICD-10-CM | POA: Diagnosis not present

## 2020-09-26 DIAGNOSIS — E1122 Type 2 diabetes mellitus with diabetic chronic kidney disease: Secondary | ICD-10-CM | POA: Diagnosis not present

## 2020-09-26 DIAGNOSIS — E785 Hyperlipidemia, unspecified: Secondary | ICD-10-CM | POA: Diagnosis not present

## 2020-09-26 DIAGNOSIS — N1832 Chronic kidney disease, stage 3b: Secondary | ICD-10-CM | POA: Diagnosis not present

## 2020-09-26 DIAGNOSIS — R7989 Other specified abnormal findings of blood chemistry: Secondary | ICD-10-CM | POA: Diagnosis not present

## 2020-09-26 DIAGNOSIS — F3174 Bipolar disorder, in full remission, most recent episode manic: Secondary | ICD-10-CM | POA: Diagnosis not present

## 2020-09-27 LAB — CBC WITH DIFFERENTIAL/PLATELET
Basophils Absolute: 0 10*3/uL (ref 0.0–0.2)
Basos: 1 %
EOS (ABSOLUTE): 0.1 10*3/uL (ref 0.0–0.4)
Eos: 2 %
Hematocrit: 40.4 % (ref 34.0–46.6)
Hemoglobin: 13.6 g/dL (ref 11.1–15.9)
Immature Grans (Abs): 0 10*3/uL (ref 0.0–0.1)
Immature Granulocytes: 0 %
Lymphocytes Absolute: 1.3 10*3/uL (ref 0.7–3.1)
Lymphs: 30 %
MCH: 29.5 pg (ref 26.6–33.0)
MCHC: 33.7 g/dL (ref 31.5–35.7)
MCV: 88 fL (ref 79–97)
Monocytes Absolute: 0.3 10*3/uL (ref 0.1–0.9)
Monocytes: 7 %
Neutrophils Absolute: 2.5 10*3/uL (ref 1.4–7.0)
Neutrophils: 60 %
Platelets: 140 10*3/uL — ABNORMAL LOW (ref 150–450)
RBC: 4.61 x10E6/uL (ref 3.77–5.28)
RDW: 12.4 % (ref 11.7–15.4)
WBC: 4.2 10*3/uL (ref 3.4–10.8)

## 2020-09-27 LAB — LIPID PANEL
Chol/HDL Ratio: 3.4 ratio (ref 0.0–4.4)
Cholesterol, Total: 195 mg/dL (ref 100–199)
HDL: 58 mg/dL (ref >39)
LDL Chol Calc (NIH): 123 mg/dL — ABNORMAL HIGH (ref 0–99)
Triglycerides: 79 mg/dL (ref 0–149)
VLDL Cholesterol Cal: 14 mg/dL (ref 5–40)

## 2020-09-27 LAB — HEMOGLOBIN A1C
Est. average glucose Bld gHb Est-mCnc: 143 mg/dL
Hgb A1c MFr Bld: 6.6 % — ABNORMAL HIGH (ref 4.8–5.6)

## 2020-09-28 ENCOUNTER — Encounter: Payer: Self-pay | Admitting: Physician Assistant

## 2020-09-28 ENCOUNTER — Other Ambulatory Visit: Payer: Self-pay

## 2020-09-28 ENCOUNTER — Ambulatory Visit (INDEPENDENT_AMBULATORY_CARE_PROVIDER_SITE_OTHER): Payer: Medicare Other | Admitting: Physician Assistant

## 2020-09-28 VITALS — BP 132/70 | HR 68 | Temp 98.2°F | Ht 62.0 in | Wt 125.9 lb

## 2020-09-28 DIAGNOSIS — Z0000 Encounter for general adult medical examination without abnormal findings: Secondary | ICD-10-CM | POA: Diagnosis not present

## 2020-09-28 DIAGNOSIS — E1122 Type 2 diabetes mellitus with diabetic chronic kidney disease: Secondary | ICD-10-CM | POA: Diagnosis not present

## 2020-09-28 DIAGNOSIS — Z780 Asymptomatic menopausal state: Secondary | ICD-10-CM | POA: Diagnosis not present

## 2020-09-28 DIAGNOSIS — E039 Hypothyroidism, unspecified: Secondary | ICD-10-CM

## 2020-09-28 DIAGNOSIS — E2839 Other primary ovarian failure: Secondary | ICD-10-CM | POA: Diagnosis not present

## 2020-09-28 LAB — POCT GLYCOSYLATED HEMOGLOBIN (HGB A1C): Hemoglobin A1C: 6.4 % — AB (ref 4.0–5.6)

## 2020-09-28 MED ORDER — LEVOTHYROXINE SODIUM 75 MCG PO TABS: 75.0000 ug | ORAL_TABLET | Freq: Every day | ORAL | 1 refills | Status: DC

## 2020-09-28 NOTE — Patient Instructions (Signed)
Preventive Care 73 Years and Older, Female Preventive care refers to lifestyle choices and visits with your health care provider that can promote health and wellness. This includes: A yearly physical exam. This is also called an annual wellness visit. Regular dental and eye exams. Immunizations. Screening for certain conditions. Healthy lifestyle choices, such as: Eating a healthy diet. Getting regular exercise. Not using drugs or products that contain nicotine and tobacco. Limiting alcohol use. What can I expect for my preventive care visit? Physical exam Your health care provider will check your: Height and weight. These may be used to calculate your BMI (body mass index). BMI is a measurement that tells if you are at a healthy weight. Heart rate and blood pressure. Body temperature. Skin for abnormal spots. Counseling Your health care provider may ask you questions about your: Past medical problems. Family's medical history. Alcohol, tobacco, and drug use. Emotional well-being. Home life and relationship well-being. Sexual activity. Diet, exercise, and sleep habits. History of falls. Memory and ability to understand (cognition). Work and work Statistician. Pregnancy and menstrual history. Access to firearms. What immunizations do I need?  Vaccines are usually given at various ages, according to a schedule. Your health care provider will recommend vaccines for you based on your age, medicalhistory, and lifestyle or other factors, such as travel or where you work. What tests do I need? Blood tests Lipid and cholesterol levels. These may be checked every 5 years, or more often depending on your overall health. Hepatitis C test. Hepatitis B test. Screening Lung cancer screening. You may have this screening every year starting at age 44 if you have a 30-pack-year history of smoking and currently smoke or have quit within the past 15 years. Colorectal cancer screening. All  adults should have this screening starting at age 39 and continuing until age 65. Your health care provider may recommend screening at age 61 if you are at increased risk. You will have tests every 1-10 years, depending on your results and the type of screening test. Diabetes screening. This is done by checking your blood sugar (glucose) after you have not eaten for a while (fasting). You may have this done every 1-3 years. Mammogram. This may be done every 1-2 years. Talk with your health care provider about how often you should have regular mammograms. Abdominal aortic aneurysm (AAA) screening. You may need this if you are a current or former smoker. BRCA-related cancer screening. This may be done if you have a family history of breast, ovarian, tubal, or peritoneal cancers. Other tests STD (sexually transmitted disease) testing, if you are at risk. Bone density scan. This is done to screen for osteoporosis. You may have this done starting at age 54. Talk with your health care provider about your test results, treatment options,and if necessary, the need for more tests. Follow these instructions at home: Eating and drinking  Eat a diet that includes fresh fruits and vegetables, whole grains, lean protein, and low-fat dairy products. Limit your intake of foods with high amounts of sugar, saturated fats, and salt. Take vitamin and mineral supplements as recommended by your health care provider. Do not drink alcohol if your health care provider tells you not to drink. If you drink alcohol: Limit how much you have to 0-1 drink a day. Be aware of how much alcohol is in your drink. In the [place] one drink equals one 12 oz bottle of beer (355 mL), one 5 oz glass of wine (148 mL), or one 1  oz glass of hard liquor (44 mL).  Lifestyle Take daily care of your teeth and gums. Brush your teeth every morning and night with fluoride toothpaste. Floss one time each day. Stay active. Exercise for at  least 30 minutes 5 or more days each week. Do not use any products that contain nicotine or tobacco, such as cigarettes, e-cigarettes, and chewing tobacco. If you need help quitting, ask your health care provider. Do not use drugs. If you are sexually active, practice safe sex. Use a condom or other form of protection in order to prevent STIs (sexually transmitted infections). Talk with your health care provider about taking a low-dose aspirin or statin. Find healthy ways to cope with stress, such as: Meditation, yoga, or listening to music. Journaling. Talking to a trusted person. Spending time with friends and family. Safety Always wear your seat belt while driving or riding in a vehicle. Do not drive: If you have been drinking alcohol. Do not ride with someone who has been drinking. When you are tired or distracted. While texting. Wear a helmet and other protective equipment during sports activities. If you have firearms in your house, make sure you follow all gun safety procedures. What's next? Visit your health care provider once a year for an annual wellness visit. Ask your health care provider how often you should have your eyes and teeth checked. Stay up to date on all vaccines. This information is not intended to replace advice given to you by your health care provider. Make sure you discuss any questions you have with your healthcare provider. Document Revised: 01/20/2020 Document Reviewed: 01/23/2018 Elsevier Patient Education  2022 [employer].

## 2020-09-28 NOTE — Progress Notes (Signed)
Subjective:   [name] is a 75 y.o. female who presents for Medicare Annual (Subsequent) preventive examination.  Review of Systems    General:   No F/C, wt loss Pulm:   No DIB, SOB, pleuritic chest pain Card:  No CP, palpitations Abd:  No n/v/d or pain Ext:  No inc edema from baseline  Objective:    Today's Vitals   09/28/20 0925  BP: 132/70  Pulse: 68  Temp: 98.2 F (36.8 C)  SpO2: 98%  Weight: 125 lb 14.4 oz (57.1 kg)  Height: '5\' 2"'$  (1.575 m)   Body mass index is 23.03 kg/m.  Advanced Directives 04/23/2017 12/19/2015 03/24/2012 02/10/2012  Does Patient Have a Medical Advance Directive? Yes Yes Patient does not have advance directive;Patient would not like information Patient has advance directive, copy not in chart  Type of Advance Directive Living will Living will;Healthcare Power of [place] will  Copy of [place] in Chart? - - - Copy requested from family  Pre-existing out of facility DNR order (yellow form or pink MOST form) - - - No  Some encounter information is confidential and restricted. Go to Review Flowsheets activity to see all data.    Current Medications (verified) Outpatient Encounter Medications as of 09/28/2020  Medication Sig   acetaminophen (TYLENOL) 500 MG tablet Take 500 mg by mouth every 6 (six) hours as needed. For pain   Baclofen 5 MG TABS Take 1 tablet by mouth twice daily as needed.   calcipotriene-betamethasone (TACLONEX SCALP) external suspension Apply topically daily as needed.    clobetasol cream (TEMOVATE) [id number] % Apply 1 application topically 2 (two) times daily as needed.    fluticasone (CUTIVATE) 0.05 % cream Apply topically 2 (two) times daily as needed.    fluticasone (FLONASE) 50 MCG/ACT nasal spray SHAKE LIQUID AND USE 1 SPRAY IN EACH NOSTRIL DAILY   FREESTYLE LITE test strip Use to check blood sugars every morning fasting and 2 hours after largest meal   ketoconazole (NIZORAL) 2 % cream Apply 1  application topically daily as needed.    Lancets (FREESTYLE) lancets Use to check blood sugars every morning fasting and 2 hours after largest meal   levothyroxine (SYNTHROID) 75 MCG tablet Take 1 tablet (75 mcg total) by mouth daily before breakfast.   SEROQUEL 50 MG tablet Take 1 tablet by mouth at bedtime.   No facility-administered encounter medications on file as of 09/28/2020.    Allergies (verified) Aspirin, Lipitor [atorvastatin calcium], Trimethoprim, Vivelle [estradiol], Zetia [ezetimibe], Zocor [simvastatin], and Sulfa antibiotics   History: Past Medical History:  Diagnosis Date   Bipolar disorder ([place])    Cataract    Phreesia 09/27/2019   Chills without fever    intermittant   Depression    Diabetes mellitus without complication ([place])    Phreesia 09/27/2019   History of colon polyps    2009 TUBULAR ADENOMA   History of concussion    02-05-2012--  fell and hit head (had previous falls prior to this day) dx mild concussion -- per MRI and neurologist note (dr Leonie Man)  right frontal lob w/ shear injuries consistant with fall/trauma   History of Graves' disease    s/p  radiactive iodine therapy 1989   History of primary hyperparathyroidism    W/  HYPERCALCEMIA--  s/p  left inferior parathyroidectomy 2014   Hyperlipidemia    Hypothyroidism, postradioiodine therapy 1989  approx   endocrinologist-  dr Gaylene Brooks [facility]   Increased  urinary frequency    Osteoporosis    Thyroid disease    Phreesia 09/27/2019   Type 2 diabetes mellitus [facility])    Past Surgical History:  Procedure Laterality Date   ABDOMINAL HYSTERECTOMY N/A    Phreesia 09/27/2019   CHOLECYSTECTOMY N/A    Phreesia 09/27/2019   CYSTOSCOPY N/A 12/19/2015   Procedure: Rolly Salter UNDER ANESTHESIA;  Surgeon: Kathie Rhodes, MD;  Location: [place];  Service: Urology;  Laterality: N/A;   PARATHYROIDECTOMY Left 03/27/2012   Procedure: Left Inferior Mininally Invasive  PARATHYROIDECTOMY;  Surgeon: Earnstine Regal, MD;  Location: [facility];  Service: General;  Laterality: Left;  Left Inferior Mininally Invasive Parathyroidectomy---  hypercellular adenoma   TRANSOBTURATOR SLING  09/01/2004   transvaginal mesh placement     TUBAL LIGATION  1978   VAGINAL HYSTERECTOMY  1986    WITH OVARIAN PRESERVATION    Family History  Problem Relation Age of Onset   Cancer Mother        MELANOMA   Diabetes Maternal Aunt    Heart attack Brother    Diabetes Maternal Uncle    Social History   Socioeconomic History   Marital status: Widowed    Spouse name: Not on file   Number of children: Not on file   Years of education: Not on file   Highest education level: Not on file  Occupational History   Not on file  Tobacco Use   Smoking status: Former    Packs/day: 1.00    Years: 30.00    Pack years: 30.00    Types: Cigarettes    Quit date: 03/18/1997    Years since quitting: 23.5   Smokeless tobacco: Never  Vaping Use   Vaping Use: Never used  Substance and Sexual Activity   Alcohol use: No    Alcohol/week: 0.0 standard drinks   Drug use: No   Sexual activity: Not Currently    Birth control/protection: Surgical  Other Topics Concern   Not on file  Social History Narrative   Not on file   Social Determinants of Health   Financial Resource Strain: Not on file  Food Insecurity: Not on file  Transportation Needs: Not on file  Physical Activity: Not on file  Stress: Not on file  Social Connections: Not on file    Tobacco Counseling Counseling given: Not Answered     Diabetic?YES         Activities of Daily Living In your present state of health, do you have any difficulty performing the following activities: 09/28/2020 07/27/2020  Hearing? N N  Vision? N N  Difficulty concentrating or making decisions? N N  Walking or climbing stairs? N N  Dressing or bathing? N N  Doing errands, shopping? N N  Some recent data might be hidden    Patient  Care Team: Lorrene Reid, PA-C as PCP - General (Physician Assistant) Juanita Craver, MD as Consulting Physician (Gastroenterology) Anda Kraft, MD as Consulting Physician (Endocrinology) Allyn Kenner, MD (Dermatology) Terrance Mass, MD (Inactive) (Gynecology) Kathie Rhodes, MD (Inactive) as Consulting Physician (Urology) Katy Apo, MD as Consulting Physician (Ophthalmology) Chauncey Mann, MD as Referring Physician (Psychiatry)  Indicate any recent Medical Services you may have received from other than [facility] providers in the past year (date may be approximate).     Assessment:   This is a routine wellness examination for [place].  Hearing/Vision screen No results found.  Dietary issues and exercise activities discussed: -Continue with walking regimen. Recommend  to reduce simple carbohydrates and sugar (cookies). Stay well hydrated.   Goals Addressed   None   Depression Screen PHQ 2/9 Scores 09/28/2020 07/27/2020 06/28/2020 03/30/2020 09/29/2019 07/08/2019 03/17/2019  PHQ - 2 Score 0 0 0 0 0 0 0  PHQ- 9 Score 0 0 0 0 0 0 0    Fall Risk Fall Risk  09/28/2020 07/27/2020 06/28/2020 03/30/2020 09/29/2019  Falls in the past year? 0 0 0 0 0  Comment - - - - -  Number falls in past yr: 0 0 0 - -  Injury with Fall? 0 0 0 - -  Risk for fall due to : No Fall Risks No Fall Risks No Fall Risks - -  Follow up Falls evaluation completed Falls evaluation completed Falls evaluation completed Falls evaluation completed Falls evaluation completed    FALL RISK PREVENTION PERTAINING TO THE HOME:  Any stairs in or around the home? Yes  If so, are there any without handrails? Yes  Home free of loose throw rugs in walkways, pet beds, electrical cords, etc? Yes  Adequate lighting in your home to reduce risk of falls? Yes   ASSISTIVE DEVICES UTILIZED TO PREVENT FALLS:  Life alert? No  Use of a cane, walker or w/c? No  Grab bars in the bathroom? Yes  Shower chair or bench in shower? No  Elevated  toilet seat or a handicapped toilet? No   TIMED UP AND GO:  Was the test performed? Yes .  Length of time to ambulate 10 feet: 10 sec.   Gait steady and fast without use of assistive device  Cognitive Function: wnl's     6CIT Screen 09/28/2020 09/29/2019 03/11/2018  What Year? 0 points 0 points 0 points  What month? 0 points 0 points 0 points  What time? 0 points 0 points 0 points  Count back from 20 0 points 0 points 0 points  Months in reverse 0 points 0 points 0 points  Repeat phrase 0 points 0 points 2 points  Total Score 0 0 2    Immunizations Immunization History  Administered Date(s) Administered   Fluad Quad(high Dose 65+) 10/01/2018   Influenza, High Dose Seasonal PF 11/08/2017, 11/13/2019   Influenza-Unspecified 11/08/2017, 10/01/2018   Moderna Sars-Covid-2 Vaccination 03/27/2019, 04/24/2019, 12/19/2019   Pneumococcal Conjugate-13 04/01/2014   Pneumococcal Polysaccharide-23 04/23/2017   Tdap 01/14/2012, 02/09/2012   Zoster Recombinat (Shingrix) 10/18/2016, 12/18/2016   Zoster, Live 10/13/2012    TDAP status: Up to date  Flu Vaccine status: Up to date  Pneumococcal vaccine status: Up to date  Covid-19 vaccine status: Completed vaccines  Qualifies for Shingles Vaccine? Yes   Zostavax completed Yes   Shingrix Completed?: Yes  Screening Tests Health Maintenance  Topic Date Due   COVID-19 Vaccine (4 - Booster for Moderna series) 03/20/2020   INFLUENZA VACCINE  09/12/2020   OPHTHALMOLOGY EXAM  02/09/2021   URINE MICROALBUMIN  03/30/2021   HEMOGLOBIN A1C  03/31/2021   FOOT EXAM  06/28/2021   TETANUS/TDAP  02/08/2022   MAMMOGRAM  09/03/2022   COLONOSCOPY (Pts 45-84yr Insurance coverage will need to be confirmed)  11/07/2027   DEXA SCAN  Completed   Hepatitis C Screening  Completed   PNA vac Low Risk Adult  Completed   Zoster Vaccines- Shingrix  Completed   HPV VACCINES  Aged Out    Health Maintenance  Health Maintenance Due  Topic Date Due    COVID-19 Vaccine (4 - Booster for Moderna series) 03/20/2020   INFLUENZA  VACCINE  09/12/2020    Colorectal cancer screening: Type of screening: Colonoscopy. Completed 11/06/2017. Repeat every 10 years  Mammogram status: Completed 09/02/2020. Repeat every year    Lung Cancer Screening: (Low Dose CT Chest recommended if Age 53-80 years, 30 pack-year currently smoking OR have quit w/in 15years.) does not qualify.   Lung Cancer Screening Referral:   Additional Screening:  Hepatitis C Screening: does qualify; Patient declined  Vision Screening: Recommended annual ophthalmology exams for early detection of glaucoma and other disorders of the eye. Is the patient up to date with their annual eye exam?  Yes  Who is the provider or what is the name of the office in which the patient attends annual eye exams? Dr. Prudencio Burly If pt is not established with a provider, would they like to be referred to a provider to establish care? No .   Dental Screening: Recommended annual dental exams for proper oral hygiene  Community Resource Referral / Chronic Care Management: CRR required this visit?  No   CCM required this visit?  No      Plan:  -Continue to follow up with various specialists.  -Discussed most recent labs which are essentially within normal limits or stable from prior with the exception of platelets, which are mildly low and will continue to monitor. Will repeat labs (cmp, cbc w/d, a1c, lipid panel, tsh) at follow up visit. -Follow up in 3 months for DM, mood, HLD  I have personally reviewed and noted the following in the patient's chart:   Medical and social history Use of alcohol, tobacco or illicit drugs  Current medications and supplements including opioid prescriptions.  Functional ability and status Nutritional status Physical activity Advanced directives List of other physicians Hospitalizations, surgeries, and ER visits in previous 12 months Vitals Screenings to include  cognitive, depression, and falls Referrals and appointments  In addition, I have reviewed and discussed with patient certain preventive protocols, quality metrics, and best practice recommendations. A written personalized care plan for preventive services as well as general preventive health recommendations were provided to patient.

## 2020-10-01 ENCOUNTER — Encounter: Payer: Self-pay | Admitting: Physician Assistant

## 2020-10-01 DIAGNOSIS — E1122 Type 2 diabetes mellitus with diabetic chronic kidney disease: Secondary | ICD-10-CM

## 2020-10-03 MED ORDER — FREESTYLE LITE TEST VI STRP: ORAL_STRIP | 11 refills | Status: DC

## 2020-10-05 ENCOUNTER — Encounter: Payer: Self-pay | Admitting: Physician Assistant

## 2020-10-05 ENCOUNTER — Ambulatory Visit (INDEPENDENT_AMBULATORY_CARE_PROVIDER_SITE_OTHER): Payer: Medicare Other | Admitting: Physician Assistant

## 2020-10-05 ENCOUNTER — Telehealth: Payer: Self-pay | Admitting: Physician Assistant

## 2020-10-05 VITALS — Ht 62.0 in | Wt 126.0 lb

## 2020-10-05 DIAGNOSIS — U071 COVID-19: Secondary | ICD-10-CM

## 2020-10-05 MED ORDER — BENZONATATE 100 MG PO CAPS: 100.0000 mg | ORAL_CAPSULE | Freq: Three times a day (TID) | ORAL | 0 refills | Status: DC | PRN

## 2020-10-05 MED ORDER — MOLNUPIRAVIR EUA 200MG CAPSULE: 4.0000 | ORAL_CAPSULE | Freq: Two times a day (BID) | ORAL | 0 refills | Status: AC

## 2020-10-05 NOTE — Progress Notes (Signed)
Telehealth office visit note for Barbara Reid, PA-C- at Primary Care at [facility]   I connected with current patient today by telephone and verified that I am speaking with the correct person    Location of the patient: Home  Location of the provider: Office - This visit type was conducted due to national recommendations for restrictions regarding the COVID-19 Pandemic (e.g. social distancing) in an effort to limit this patient's exposure and mitigate transmission in our community.    - No physical exam could be performed with this format, beyond that communicated to [place] by the patient/ family members as noted.   - Additionally my office staff/ schedulers were to discuss with the patient that there may be a monetary charge related to this service, depending on their medical insurance.  My understanding is that patient understood and consented to proceed.     _________________________________________________________________________________   History of Present Illness: Patient calls in with COVID-19 infection.  Reports started having symptoms of mild cough, congestion and chills on Sunday (3 days ago).  Did not at-home COVID test this morning which resulted positive.  States has not taken anything for her symptoms.  Denies fever, headache, shortness of breath, chest pain, palpitations, sore throat, nausea, vomiting or diarrhea.  Patient has obtained 2 doses of Moderna and a booster.     GAD 7 : Generalized Anxiety Score 10/05/2020 09/28/2020 07/27/2020  Nervous, Anxious, on Edge 0 0 0  Control/stop worrying 0 0 0  Worry too much - different things 0 0 0  Trouble relaxing 0 0 0  Restless 0 0 0  Easily annoyed or irritable 0 0 0  Afraid - awful might happen 0 0 0  Total GAD 7 Score 0 0 0    Depression screen [facility] 2/9 10/05/2020 09/28/2020 07/27/2020 06/28/2020 03/30/2020  Decreased Interest 0 0 0 0 0  Down, Depressed, Hopeless 0 0 0 0 0  PHQ - 2 Score 0 0 0 0 0  Altered sleeping 0  0 0 0 0  Tired, decreased energy 0 0 0 0 0  Change in appetite 0 0 0 0 0  Feeling bad or failure about yourself  0 0 0 0 0  Trouble concentrating 0 0 0 0 0  Moving slowly or fidgety/restless 0 0 0 0 0  Suicidal thoughts 0 0 0 0 0  PHQ-9 Score 0 0 0 0 0  Some recent data might be hidden      Impression and Recommendations:     1. COVID-19 virus infection   -Patient has 4 [id number] risk of complications and is within the 5-day window of symptom onset so will start oral antiviral medication. Patient is on levothyroxine, will start molnupiravir. Recommend to take Tessalon Perles as needed for cough. Discussed latest CDC isolation guidelines.  Advised to monitor for worsening symptoms.    - As part of my medical decision making, I reviewed the following data within the [place] History obtained from pt /family, CMA notes reviewed and incorporated if applicable, Labs reviewed, Radiograph/ tests reviewed if applicable and OV notes from prior OV's with me, as well as any other specialists she/he has seen since seeing me last, were all reviewed and used in my medical decision making process today.    - Additionally, when appropriate, discussion had with patient regarding our treatment plan, and their biases/concerns about that plan were used in my medical decision making today.    -  The patient agreed with the plan and demonstrated an understanding of the instructions.   No barriers to understanding were identified.     - The patient was advised to call back or seek an in-person evaluation if the symptoms worsen or if the condition fails to improve as anticipated.   Return if symptoms worsen or fail to improve.    No orders of the defined types were placed in this encounter.   Meds ordered this encounter  Medications   molnupiravir EUA 200 mg CAPS    Sig: Take 4 capsules (800 mg total) by mouth 2 (two) times daily for 5 days.    Dispense:  40 capsule    Refill:  0     Order Specific Question:   Supervising Provider    Answer:   Beatrice Lecher D [[id number]]   benzonatate (TESSALON) 100 MG capsule    Sig: Take 1 capsule (100 mg total) by mouth 3 (three) times daily as needed for cough.    Dispense:  20 capsule    Refill:  0    Order Specific Question:   Supervising Provider    Answer:   Beatrice Lecher D [[id number]]    There are no discontinued medications.     Time spent on telephone encounter was 6 minutes.   Note:  This note was prepared with assistance of Dragon voice recognition software. Occasional wrong-word or sound-a-like substitutions may have occurred due to the inherent limitations of voice recognition software.    The [place] was signed into law in 2016 which includes the topic of electronic health records.  This provides immediate access to information in MyChart.  This includes consultation notes, operative notes, office notes, lab results and pathology reports.  If you have any questions about what you read please let us know at your next visit or call us at the office.  We are right here with you.   __________________________________________________________________________________     Patient Care Team    Relationship Specialty Notifications Start End  [name], [place] PCP - General Physician Assistant  07/08/19   Juanita Craver, MD Consulting Physician Gastroenterology  04/23/17   Anda Kraft, MD Consulting Physician Endocrinology  04/23/17   Allyn Kenner, MD  Dermatology  04/23/17   Terrance Mass, MD (Inactive)  Gynecology  04/23/17   Kathie Rhodes, MD (Inactive) Consulting Physician Urology  04/23/17   Katy Apo, MD Consulting Physician Ophthalmology  04/23/17   Chauncey Mann, MD Referring Physician Psychiatry  04/23/17      -Vitals obtained; medications/ allergies reconciled;  personal medical, social, Sx etc.histories were updated by CMA, reviewed by me and are reflected in chart   Patient Active  Problem List   Diagnosis Date Noted   Abnormal weight loss 03/30/2020   Cholelithiasis without obstruction 03/30/2020   Chronic idiopathic constipation 03/30/2020   Cirrhosis, nonalcoholic ([place]) 0000000   Constipation 03/30/2020   Family history of malignant neoplasm of gastrointestinal tract 03/30/2020   Fatty liver 03/30/2020   Gastroesophageal reflux disease 03/30/2020   Gastro-esophageal reflux disease with esophagitis 03/30/2020   Imaging of gastrointestinal tract abnormal 03/30/2020   Personal history of colonic polyps 03/30/2020   Rectal bleeding 03/30/2020   Pre-diabetes 09/10/2018   Muscle spasm 06/17/2018   Acute bilateral low back pain without sciatica 06/17/2018   Disease of thyroid gland 11/25/2017   Diabetes mellitus due to non-steroid drug without complication ([place]) [id number]   Healthcare maintenance 04/23/2017   Type 2 diabetes mellitus (  [place]) 04/23/2017   Hyperlipidemia 04/23/2017   Decreased GFR 04/23/2017   Elevated LFTs 04/23/2017   Osteoporosis 08/23/2015   Primary hyperparathyroidism ([place]) 03/27/2012   Abdominal pain    Hyponatremia 02/13/2012   Chronic diastolic CHF (congestive heart failure) ([place]) 02/13/2012   Hypothyroidism 02/11/2012   CVA (cerebral infarction) 02/10/2012   Ataxia 02/10/2012   Bipolar 1 disorder ([place]) 02/10/2012   Hypotension, postural 02/09/2012   Bipolar 1 disorder, manic, moderate ([facility]) 02/05/2012     Current Meds  Medication Sig   acetaminophen (TYLENOL) 500 MG tablet Take 500 mg by mouth every 6 (six) hours as needed. For pain   Baclofen 5 MG TABS Take 1 tablet by mouth twice daily as needed.   benzonatate (TESSALON) 100 MG capsule Take 1 capsule (100 mg total) by mouth 3 (three) times daily as needed for cough.   calcipotriene-betamethasone (TACLONEX SCALP) external suspension Apply topically daily as needed.    clobetasol cream (TEMOVATE) [id number] % Apply 1 application topically 2 (two) times daily as needed.    fluticasone  (CUTIVATE) 0.05 % cream Apply topically 2 (two) times daily as needed.    fluticasone (FLONASE) 50 MCG/ACT nasal spray SHAKE LIQUID AND USE 1 SPRAY IN EACH NOSTRIL DAILY   FREESTYLE LITE test strip Use to check blood sugars every morning fasting and 2 hours after largest meal   ketoconazole (NIZORAL) 2 % cream Apply 1 application topically daily as needed.    Lancets (FREESTYLE) lancets Use to check blood sugars every morning fasting and 2 hours after largest meal   levothyroxine (SYNTHROID) 75 MCG tablet Take 1 tablet (75 mcg total) by mouth daily before breakfast.   molnupiravir EUA 200 mg CAPS Take 4 capsules (800 mg total) by mouth 2 (two) times daily for 5 days.   SEROQUEL 50 MG tablet Take 1 tablet by mouth at bedtime.     Allergies:  Allergies  Allergen Reactions   Aspirin Other (See Comments)    BLISTERS ON LIPS   Lipitor [Atorvastatin Calcium] Other (See Comments)    Myalgia    Trimethoprim Other (See Comments)    Unknown reaction   Vivelle [Estradiol] Other (See Comments)    "heart flutters"   Zetia [Ezetimibe] Other (See Comments)    Myalgias   Zocor [Simvastatin] Other (See Comments)    myalgias   Sulfa Antibiotics Rash    And blistering of lips     ROS:  See above HPI for pertinent positives and negatives   Objective:   Height '5\' 2"'$  (1.575 m), weight 126 lb (57.2 kg).  (if some vitals are omitted, this means that patient was UNABLE to obtain them.) General: A & O * 3; sounds in no acute distress Respiratory: speaking in full sentences, no conversational dyspnea Psych: insight appears good, mood- appears full

## 2020-10-05 NOTE — Telephone Encounter (Signed)
Patient has tested positive for COVID today and she is not real sick she states and would like a call and advise, thanks.

## 2020-10-05 NOTE — Patient Instructions (Signed)

## 2020-12-10 ENCOUNTER — Encounter: Payer: Self-pay | Admitting: Physician Assistant

## 2020-12-10 DIAGNOSIS — E1122 Type 2 diabetes mellitus with diabetic chronic kidney disease: Secondary | ICD-10-CM

## 2020-12-12 MED ORDER — FREESTYLE LITE TEST VI STRP: ORAL_STRIP | 11 refills | Status: DC

## 2020-12-13 DIAGNOSIS — Z79899 Other long term (current) drug therapy: Secondary | ICD-10-CM | POA: Diagnosis not present

## 2020-12-13 DIAGNOSIS — F3174 Bipolar disorder, in full remission, most recent episode manic: Secondary | ICD-10-CM | POA: Diagnosis not present

## 2020-12-13 DIAGNOSIS — A009 Cholera, unspecified: Secondary | ICD-10-CM | POA: Diagnosis not present

## 2020-12-13 DIAGNOSIS — F5105 Insomnia due to other mental disorder: Secondary | ICD-10-CM | POA: Diagnosis not present

## 2020-12-20 DIAGNOSIS — F3174 Bipolar disorder, in full remission, most recent episode manic: Secondary | ICD-10-CM | POA: Diagnosis not present

## 2020-12-20 DIAGNOSIS — F5105 Insomnia due to other mental disorder: Secondary | ICD-10-CM | POA: Diagnosis not present

## 2020-12-29 ENCOUNTER — Encounter: Payer: Self-pay | Admitting: Physician Assistant

## 2020-12-29 ENCOUNTER — Ambulatory Visit (INDEPENDENT_AMBULATORY_CARE_PROVIDER_SITE_OTHER): Payer: Medicare Other | Admitting: Physician Assistant

## 2020-12-29 ENCOUNTER — Other Ambulatory Visit: Payer: Self-pay

## 2020-12-29 VITALS — BP 104/61 | HR 58 | Temp 98.2°F | Ht 62.0 in | Wt 121.0 lb

## 2020-12-29 DIAGNOSIS — E1122 Type 2 diabetes mellitus with diabetic chronic kidney disease: Secondary | ICD-10-CM

## 2020-12-29 DIAGNOSIS — E785 Hyperlipidemia, unspecified: Secondary | ICD-10-CM | POA: Diagnosis not present

## 2020-12-29 DIAGNOSIS — E039 Hypothyroidism, unspecified: Secondary | ICD-10-CM

## 2020-12-29 DIAGNOSIS — F319 Bipolar disorder, unspecified: Secondary | ICD-10-CM | POA: Diagnosis not present

## 2020-12-29 LAB — POCT GLYCOSYLATED HEMOGLOBIN (HGB A1C): Hemoglobin A1C: 6.2 % — AB (ref 4.0–5.6)

## 2020-12-29 NOTE — Assessment & Plan Note (Addendum)
-  A1c has improved from 6.4 to 6.2, diet controlled. -Will continue to monitor.

## 2020-12-29 NOTE — Assessment & Plan Note (Signed)
-  Followed by Psychiatry. -On Seroquel 50 mg. -GAD- 7 score of 0, PHQ-9 score of 0.

## 2020-12-29 NOTE — Assessment & Plan Note (Signed)
-  Last TSH wnl -Continue current medication regimen. -Rechecking thyroid labs today. Pending results will make medication adjustments if indicated.  

## 2020-12-29 NOTE — Assessment & Plan Note (Addendum)
-  Last lipid panel: total cholesterol 195, triglycerides 79, HDL 58, LDL 123 -The 10-year ASCVD risk score (Arnett DK, et al., 2019) is: 16.4% -Patient has hx of statin intolerance and managing with diet. Will repeat lipid panel. If LDL remains elevated or fails to improve recommend to consider referral to lipid clinic. Discussed low fat diet.

## 2020-12-29 NOTE — Assessment & Plan Note (Signed)
>>  ASSESSMENT AND PLAN FOR BIPOLAR 1 DISORDER ([facility]) WRITTEN ON 12/29/2020 11:18 AM BY ABONZA, MARITZA, PA-C  -Followed by Psychiatry. -On Seroquel 50 mg. -GAD- 7 score of 0, PHQ-9 score of 0.

## 2020-12-29 NOTE — Patient Instructions (Signed)

## 2020-12-29 NOTE — Progress Notes (Signed)
Established Patient Office Visit  Subjective:  Patient ID: [name], female    DOB: September 27, 1945  Age: 75 y.o. MRN: [id number]  CC:  Chief Complaint  Patient presents with   Follow-up    Mood   Diabetes   Hyperlipidemia   Thyroid Problem    HPI [name] presents for follow up on diabetes mellitus, hyperlipidemia and hypothyroidism. Has no acute concerns.  Diabetes: Pt denies increased urination or thirst. Pt reports medication compliance. No hypoglycemic events. Checking glucose at home. FBS have been <125, this morning was 104. States has been more active with dancing. Monitoring carbohydrate and glucose intake.  HLD: Pt trying to manage with diet. Unable to tolerate statins. Does report has been eating more foods with butter.  Thyroid: Reports medication compliance. Denies fatigue, heat/cold intolerance, or weight changes.  Mood: Followed by Psychiatry, Dr. Nicolasa Ducking. States mood has been stable. Is in a new relationship and things are going well. Denies SI/HI.  Past Medical History:  Diagnosis Date   Bipolar disorder ([place])    Cataract    Phreesia 09/27/2019   Chills without fever    intermittant   Depression    Diabetes mellitus without complication ([place])    Phreesia 09/27/2019   History of colon polyps    2009 TUBULAR ADENOMA   History of concussion    02-05-2012--  fell and hit head (had previous falls prior to this day) dx mild concussion -- per MRI and neurologist note (dr Leonie Man)  right frontal lob w/ shear injuries consistant with fall/trauma   History of Graves' disease    s/p  radiactive iodine therapy 1989   History of primary hyperparathyroidism    W/  HYPERCALCEMIA--  s/p  left inferior parathyroidectomy 2014   Hyperlipidemia    Hypothyroidism, postradioiodine therapy 1989  approx   endocrinologist-  dr Gaylene Brooks [facility]   Increased urinary frequency    Osteoporosis    Thyroid disease    Phreesia 09/27/2019   Type 2 diabetes  mellitus [facility])     Past Surgical History:  Procedure Laterality Date   ABDOMINAL HYSTERECTOMY N/A    Phreesia 09/27/2019   CHOLECYSTECTOMY N/A    Phreesia 09/27/2019   CYSTOSCOPY N/A 12/19/2015   Procedure: Rolly Salter UNDER ANESTHESIA;  Surgeon: Kathie Rhodes, MD;  Location: [facility];  Service: Urology;  Laterality: N/A;   PARATHYROIDECTOMY Left 03/27/2012   Procedure: Left Inferior Mininally Invasive PARATHYROIDECTOMY;  Surgeon: Earnstine Regal, MD;  Location: [facility];  Service: General;  Laterality: Left;  Left Inferior Mininally Invasive Parathyroidectomy---  hypercellular adenoma   TRANSOBTURATOR SLING  09/01/2004   transvaginal mesh placement     TUBAL LIGATION  1978   VAGINAL HYSTERECTOMY  1986    WITH OVARIAN PRESERVATION     Family History  Problem Relation Age of Onset   Cancer Mother        MELANOMA   Diabetes Maternal Aunt    Heart attack Brother    Diabetes Maternal Uncle     Social History   Socioeconomic History   Marital status: Widowed    Spouse name: Not on file   Number of children: Not on file   Years of education: Not on file   Highest education level: Not on file  Occupational History   Not on file  Tobacco Use   Smoking status: Former    Packs/day: 1.00    Years: 30.00    Pack years: 30.00  Types: Cigarettes    Quit date: 03/18/1997    Years since quitting: 23.8   Smokeless tobacco: Never  Vaping Use   Vaping Use: Never used  Substance and Sexual Activity   Alcohol use: No    Alcohol/week: 0.0 standard drinks   Drug use: No   Sexual activity: Not Currently    Birth control/protection: Surgical  Other Topics Concern   Not on file  Social History Narrative   Not on file   Social Determinants of Health   Financial Resource Strain: Not on file  Food Insecurity: Not on file  Transportation Needs: Not on file  Physical Activity: Not on file  Stress: Not on file  Social Connections: Not on file  Intimate Partner  Violence: Not on file    Outpatient Medications Prior to Visit  Medication Sig Dispense Refill   acetaminophen (TYLENOL) 500 MG tablet Take 500 mg by mouth every 6 (six) hours as needed. For pain     Baclofen 5 MG TABS Take 1 tablet by mouth twice daily as needed. 30 tablet 0   calcipotriene-betamethasone (TACLONEX SCALP) external suspension Apply topically daily as needed.      clobetasol cream (TEMOVATE) 4.27 % Apply 1 application topically 2 (two) times daily as needed.      fluticasone (CUTIVATE) 0.05 % cream Apply topically 2 (two) times daily as needed.      fluticasone (FLONASE) 50 MCG/ACT nasal spray SHAKE LIQUID AND USE 1 SPRAY IN EACH NOSTRIL DAILY 16 g 0   FREESTYLE LITE test strip Use to check blood sugars every morning fasting and 2 hours after largest meal 100 each 11   ketoconazole (NIZORAL) 2 % cream Apply 1 application topically daily as needed.      Lancets (FREESTYLE) lancets Use to check blood sugars every morning fasting and 2 hours after largest meal 100 each 11   levothyroxine (SYNTHROID) 75 MCG tablet Take 1 tablet (75 mcg total) by mouth daily before breakfast. 90 tablet 1   SEROQUEL 50 MG tablet Take 1 tablet by mouth at bedtime.     benzonatate (TESSALON) 100 MG capsule Take 1 capsule (100 mg total) by mouth 3 (three) times daily as needed for cough. 20 capsule 0   No facility-administered medications prior to visit.    Allergies  Allergen Reactions   Aspirin Other (See Comments)    BLISTERS ON LIPS   Lipitor [Atorvastatin Calcium] Other (See Comments)    Myalgia    Trimethoprim Other (See Comments)    Unknown reaction   Vivelle [Estradiol] Other (See Comments)    "heart flutters"   Zetia [Ezetimibe] Other (See Comments)    Myalgias   Zocor [Simvastatin] Other (See Comments)    myalgias   Sulfa Antibiotics Rash    And blistering of lips    ROS Review of Systems Review of Systems:  A fourteen system review of systems was performed and found to be  positive as per HPI.   Objective:    Physical Exam General:  Well Developed, well nourished, appropriate for stated age.  Neuro:  Alert and oriented,  extra-ocular muscles intact  HEENT:  Normocephalic, atraumatic, neck supple  Skin:  no gross rash, warm, pink. Cardiac:  RRR, S1 S2 Respiratory:  CTA B/L w/o wheezing, crackles or rales, Not using accessory muscles, speaking in full sentences- unlabored. Vascular:  Ext warm, no cyanosis apprec.; cap RF less 2 sec. Psych:  No HI/SI, judgement and insight good, Euthymic mood. Full Affect.  BP  104/61   Pulse (!) 58   Temp 98.2 F (36.8 C)   Ht _0  (1.575 m)   Wt 121 lb (54.9 kg)   SpO2 99%   BMI 22.13 kg/m  Wt Readings from Last 3 Encounters:  12/29/20 121 lb (54.9 kg)  10/05/20 126 lb (57.2 kg)  09/28/20 125 lb 14.4 oz (57.1 kg)     Health Maintenance Due  Topic Date Due   COVID-19 Vaccine (4 - Booster for Moderna series) 02/13/2020    There are no preventive care reminders to display for this patient.  Lab Results  Component Value Date   TSH 0.690 06/21/2020   Lab Results  Component Value Date   WBC 4.2 09/26/2020   HGB 13.6 09/26/2020   HCT 40.4 09/26/2020   MCV 88 09/26/2020   PLT 140 (L) 09/26/2020   Lab Results  Component Value Date   NA 145 (H) 06/21/2020   K 4.3 06/21/2020   CO2 23 06/21/2020   GLUCOSE 128 (H) 06/21/2020   BUN 19 06/21/2020   CREATININE 1.31 (H) 06/21/2020   BILITOT 0.5 06/21/2020   ALKPHOS 74 06/21/2020   AST 24 06/21/2020   ALT 27 06/21/2020   PROT 6.5 06/21/2020   ALBUMIN 4.5 06/21/2020   CALCIUM 10.5 (H) 06/21/2020   EGFR 43 (L) 06/21/2020   Lab Results  Component Value Date   CHOL 195 09/26/2020   Lab Results  Component Value Date   HDL 58 09/26/2020   Lab Results  Component Value Date   LDLCALC 123 (H) 09/26/2020   Lab Results  Component Value Date   TRIG 79 09/26/2020   Lab Results  Component Value Date   CHOLHDL 3.4 09/26/2020   Lab Results  Component  Value Date   HGBA1C 6.2 (A) 12/29/2020   Depression screen PHQ 2/9 12/29/2020 10/05/2020 09/28/2020 07/27/2020 06/28/2020  Decreased Interest 0 0 0 0 0  Down, Depressed, Hopeless 0 0 0 0 0  PHQ - 2 Score 0 0 0 0 0  Altered sleeping 0 0 0 0 0  Tired, decreased energy 0 0 0 0 0  Change in appetite 0 0 0 0 0  Feeling bad or failure about yourself  0 0 0 0 0  Trouble concentrating 0 0 0 0 0  Moving slowly or fidgety/restless 0 0 0 0 0  Suicidal thoughts 0 0 0 0 0  PHQ-9 Score 0 0 0 0 0  Difficult doing work/chores Not difficult at all - - - -  Some recent data might be hidden   GAD 7 : Generalized Anxiety Score 12/29/2020 10/05/2020 09/28/2020 07/27/2020  Nervous, Anxious, on Edge 0 0 0 0  Control/stop worrying 0 0 0 0  Worry too much - different things 0 0 0 0  Trouble relaxing 0 0 0 0  Restless 0 0 0 0  Easily annoyed or irritable 0 0 0 0  Afraid - awful might happen 0 0 0 0  Total GAD 7 Score 0 0 0 0  Anxiety Difficulty Not difficult at all - - -       Assessment & Plan:   Problem List Items Addressed This Visit       Endocrine   Type 2 diabetes mellitus ([place]) - Primary (Chronic)    -A1c has improved from 6.4 to 6.2, diet controlled. -Will continue to monitor.      Relevant Orders   POCT glycosylated hemoglobin (Hb A1C) (Completed)   Comp Met (CMET)  Hypothyroidism    -Last TSH wnl -Continue current medication regimen. -Rechecking thyroid labs today. Pending results will make medication adjustments if indicated.       Relevant Orders   TSH   T4, free     Other   Hyperlipidemia (Chronic)    -Last lipid panel: total cholesterol 195, triglycerides 79, HDL 58, LDL 123 -The 10-year ASCVD risk score (Arnett DK, et al., 2019) is: 16.4% -Patient has hx of statin intolerance and managing with diet. Will repeat lipid panel. If LDL remains elevated or fails to improve recommend to consider referral to lipid clinic. Discussed low fat diet.       Relevant Orders   Lipid  Profile   CBC w/Diff   Bipolar 1 disorder ([place])    -Followed by Psychiatry. -On Seroquel 50 mg. -GAD- 7 score of 0, PHQ-9 score of 0.       No orders of the defined types were placed in this encounter.   Follow-up: Return in about 4 months (around 04/28/2021) for DM, HLD, Thyroid, Mood.    Lorrene Reid, PA-C

## 2020-12-30 LAB — COMPREHENSIVE METABOLIC PANEL
ALT: 33 IU/L — ABNORMAL HIGH (ref 0–32)
AST: 29 IU/L (ref 0–40)
Albumin/Globulin Ratio: 2 (ref 1.2–2.2)
Albumin: 4.5 g/dL (ref 3.7–4.7)
Alkaline Phosphatase: 71 IU/L (ref 44–121)
BUN/Creatinine Ratio: 12 (ref 12–28)
BUN: 16 mg/dL (ref 8–27)
Bilirubin Total: 0.3 mg/dL (ref 0.0–1.2)
CO2: 24 mmol/L (ref 20–29)
Calcium: 10.4 mg/dL — ABNORMAL HIGH (ref 8.7–10.3)
Chloride: 110 mmol/L — ABNORMAL HIGH (ref 96–106)
Creatinine, Ser: 1.33 mg/dL — ABNORMAL HIGH (ref 0.57–1.00)
Globulin, Total: 2.3 g/dL (ref 1.5–4.5)
Glucose: 114 mg/dL — ABNORMAL HIGH (ref 70–99)
Potassium: 4 mmol/L (ref 3.5–5.2)
Sodium: 146 mmol/L — ABNORMAL HIGH (ref 134–144)
Total Protein: 6.8 g/dL (ref 6.0–8.5)
eGFR: 42 mL/min/{1.73_m2} — ABNORMAL LOW (ref >59)

## 2020-12-30 LAB — TSH: TSH: 0.805 u[IU]/mL (ref 0.450–4.500)

## 2020-12-30 LAB — CBC WITH DIFFERENTIAL/PLATELET
Basophils Absolute: 0 10*3/uL (ref 0.0–0.2)
Basos: 1 %
EOS (ABSOLUTE): 0.1 10*3/uL (ref 0.0–0.4)
Eos: 2 %
Hematocrit: 38.9 % (ref 34.0–46.6)
Hemoglobin: 13.1 g/dL (ref 11.1–15.9)
Immature Grans (Abs): 0 10*3/uL (ref 0.0–0.1)
Immature Granulocytes: 0 %
Lymphocytes Absolute: 1.2 10*3/uL (ref 0.7–3.1)
Lymphs: 36 %
MCH: 29.5 pg (ref 26.6–33.0)
MCHC: 33.7 g/dL (ref 31.5–35.7)
MCV: 88 fL (ref 79–97)
Monocytes Absolute: 0.2 10*3/uL (ref 0.1–0.9)
Monocytes: 6 %
Neutrophils Absolute: 1.8 10*3/uL (ref 1.4–7.0)
Neutrophils: 55 %
Platelets: 112 10*3/uL — ABNORMAL LOW (ref 150–450)
RBC: 4.44 x10E6/uL (ref 3.77–5.28)
RDW: 13.1 % (ref 11.7–15.4)
WBC: 3.3 10*3/uL — ABNORMAL LOW (ref 3.4–10.8)

## 2020-12-30 LAB — T4, FREE: Free T4: 1.27 ng/dL (ref 0.82–1.77)

## 2020-12-30 LAB — LIPID PANEL
Chol/HDL Ratio: 3.3 ratio (ref 0.0–4.4)
Cholesterol, Total: 194 mg/dL (ref 100–199)
HDL: 58 mg/dL (ref >39)
LDL Chol Calc (NIH): 116 mg/dL — ABNORMAL HIGH (ref 0–99)
Triglycerides: 112 mg/dL (ref 0–149)
VLDL Cholesterol Cal: 20 mg/dL (ref 5–40)

## 2021-01-13 ENCOUNTER — Encounter: Payer: Self-pay | Admitting: Physician Assistant

## 2021-01-30 ENCOUNTER — Other Ambulatory Visit: Payer: Self-pay

## 2021-01-30 ENCOUNTER — Other Ambulatory Visit: Payer: Medicare Other

## 2021-01-30 DIAGNOSIS — D696 Thrombocytopenia, unspecified: Secondary | ICD-10-CM | POA: Diagnosis not present

## 2021-01-31 ENCOUNTER — Telehealth: Payer: Self-pay | Admitting: Physician Assistant

## 2021-01-31 LAB — CBC WITH DIFFERENTIAL/PLATELET
Basophils Absolute: 0 10*3/uL (ref 0.0–0.2)
Basos: 1 %
EOS (ABSOLUTE): 0.1 10*3/uL (ref 0.0–0.4)
Eos: 2 %
Hematocrit: 43.9 % (ref 34.0–46.6)
Hemoglobin: 14.3 g/dL (ref 11.1–15.9)
Immature Grans (Abs): 0 10*3/uL (ref 0.0–0.1)
Immature Granulocytes: 0 %
Lymphocytes Absolute: 1.4 10*3/uL (ref 0.7–3.1)
Lymphs: 24 %
MCH: 28.8 pg (ref 26.6–33.0)
MCHC: 32.6 g/dL (ref 31.5–35.7)
MCV: 89 fL (ref 79–97)
Monocytes Absolute: 0.4 10*3/uL (ref 0.1–0.9)
Monocytes: 7 %
Neutrophils Absolute: 3.8 10*3/uL (ref 1.4–7.0)
Neutrophils: 66 %
Platelets: 154 10*3/uL (ref 150–450)
RBC: 4.96 x10E6/uL (ref 3.77–5.28)
RDW: 12.6 % (ref 11.7–15.4)
WBC: 5.7 10*3/uL (ref 3.4–10.8)

## 2021-01-31 NOTE — Telephone Encounter (Signed)
Patient called regarding her labs can you please call back? [phone]

## 2021-01-31 NOTE — Telephone Encounter (Signed)
Called patient back to give lab results.

## 2021-02-08 LAB — HM DIABETES EYE EXAM

## 2021-02-09 DIAGNOSIS — Z961 Presence of intraocular lens: Secondary | ICD-10-CM | POA: Diagnosis not present

## 2021-02-09 DIAGNOSIS — E119 Type 2 diabetes mellitus without complications: Secondary | ICD-10-CM | POA: Diagnosis not present

## 2021-02-10 ENCOUNTER — Encounter: Payer: Self-pay | Admitting: Physician Assistant

## 2021-02-15 ENCOUNTER — Telehealth: Payer: Self-pay | Admitting: Physician Assistant

## 2021-02-15 NOTE — Telephone Encounter (Signed)
Yes, scheduled her an appointment for tomorrow afternoon at 1:30.

## 2021-02-15 NOTE — Telephone Encounter (Signed)
She needs to be seen. Do we have an openings tomorrow?

## 2021-02-15 NOTE — Telephone Encounter (Signed)
Patient called and is requesting if something can be called in for her "cold". She said she was negative for Covid but needed something to help her. [phone]

## 2021-02-16 ENCOUNTER — Encounter: Payer: Self-pay | Admitting: Nurse Practitioner

## 2021-02-16 ENCOUNTER — Other Ambulatory Visit: Payer: Self-pay

## 2021-02-16 ENCOUNTER — Ambulatory Visit (INDEPENDENT_AMBULATORY_CARE_PROVIDER_SITE_OTHER): Payer: Medicare Other | Admitting: Nurse Practitioner

## 2021-02-16 VITALS — BP 111/62 | HR 60 | Temp 98.0°F | Ht 62.0 in | Wt 121.7 lb

## 2021-02-16 DIAGNOSIS — J069 Acute upper respiratory infection, unspecified: Secondary | ICD-10-CM | POA: Diagnosis not present

## 2021-02-16 NOTE — Progress Notes (Signed)
Acute Office Visit  Subjective:    Patient ID: [name], female    DOB: 08/14/1945, 76 y.o.   MRN: [id number]  Chief Complaint  Patient presents with   Cough    The patient states that she did take home test for COVID 19 and it was negative .  Cough This is a new problem. The current episode started in the past 7 days. The problem has been gradually improving. The cough is Non-productive. Associated symptoms include headaches, nasal congestion, postnasal drip and rhinorrhea. Pertinent negatives include no chest pain, chills, ear congestion, ear pain, fever, myalgias, rash, sore throat, shortness of breath or wheezing. Nothing aggravates the symptoms. Treatments tried: patient has been taking OTC cold and flu symptom reliever and this does help. The treatment provided mild relief. There is no history of environmental allergies.    Past Medical History:  Diagnosis Date   Bipolar disorder ([place])    Cataract    Phreesia 09/27/2019   Chills without fever    intermittant   Depression    Diabetes mellitus without complication ([place])    Phreesia 09/27/2019   History of colon polyps    2009 TUBULAR ADENOMA   History of concussion    02-05-2012--  fell and hit head (had previous falls prior to this day) dx mild concussion -- per MRI and neurologist note (dr Leonie Man)  right frontal lob w/ shear injuries consistant with fall/trauma   History of Graves' disease    s/p  radiactive iodine therapy 1989   History of primary hyperparathyroidism    W/  HYPERCALCEMIA--  s/p  left inferior parathyroidectomy 2014   Hyperlipidemia    Hypothyroidism, postradioiodine therapy 1989  approx   endocrinologist-  dr Gaylene Brooks [facility]   Increased urinary frequency    Osteoporosis    Thyroid disease    Phreesia 09/27/2019   Type 2 diabetes mellitus [facility])     Past Surgical History:  Procedure Laterality Date   ABDOMINAL HYSTERECTOMY N/A    Phreesia 09/27/2019   CHOLECYSTECTOMY  N/A    Phreesia 09/27/2019   CYSTOSCOPY N/A 12/19/2015   Procedure: Rolly Salter UNDER ANESTHESIA;  Surgeon: Kathie Rhodes, MD;  Location: [facility];  Service: Urology;  Laterality: N/A;   PARATHYROIDECTOMY Left 03/27/2012   Procedure: Left Inferior Mininally Invasive PARATHYROIDECTOMY;  Surgeon: Earnstine Regal, MD;  [facility];  Service: General;  Laterality: Left;  Left Inferior Mininally Invasive Parathyroidectomy---  hypercellular adenoma   TRANSOBTURATOR SLING  09/01/2004   transvaginal mesh placement     TUBAL LIGATION  1978   VAGINAL HYSTERECTOMY  1986    WITH OVARIAN PRESERVATION     Family History  Problem Relation Age of Onset   Cancer Mother        MELANOMA   Diabetes Maternal Aunt    Heart attack Brother    Diabetes Maternal Uncle     Social History   Socioeconomic History   Marital status: Widowed    Spouse name: Not on file   Number of children: Not on file   Years of education: Not on file   Highest education level: Not on file  Occupational History   Not on file  Tobacco Use   Smoking status: Former    Packs/day: 1.00    Years: 30.00    Pack years: 30.00    Types: Cigarettes    Quit date: 03/18/1997    Years since quitting: 23.9   Smokeless tobacco: Never  Vaping Use   Vaping Use: Never used  Substance and Sexual Activity   Alcohol use: No    Alcohol/week: 0.0 standard drinks   Drug use: No   Sexual activity: Not Currently    Birth control/protection: Surgical  Other Topics Concern   Not on file  Social History Narrative   Not on file   Social Determinants of Health   Financial Resource Strain: Not on file  Food Insecurity: Not on file  Transportation Needs: Not on file  Physical Activity: Not on file  Stress: Not on file  Social Connections: Not on file  Intimate Partner Violence: Not on file    Outpatient Medications Prior to Visit  Medication Sig Dispense Refill   acetaminophen (TYLENOL) 500 MG tablet Take 500  mg by mouth every 6 (six) hours as needed. For pain     Baclofen 5 MG TABS Take 1 tablet by mouth twice daily as needed. 30 tablet 0   calcipotriene-betamethasone (TACLONEX SCALP) external suspension Apply topically daily as needed.      clobetasol cream (TEMOVATE) 7.34 % Apply 1 application topically 2 (two) times daily as needed.      fluticasone (CUTIVATE) 0.05 % cream Apply topically 2 (two) times daily as needed.      fluticasone (FLONASE) 50 MCG/ACT nasal spray SHAKE LIQUID AND USE 1 SPRAY IN EACH NOSTRIL DAILY 16 g 0   FREESTYLE LITE test strip Use to check blood sugars every morning fasting and 2 hours after largest meal 100 each 11   ketoconazole (NIZORAL) 2 % cream Apply 1 application topically daily as needed.      Lancets (FREESTYLE) lancets Use to check blood sugars every morning fasting and 2 hours after largest meal 100 each 11   levothyroxine (SYNTHROID) 75 MCG tablet Take 1 tablet (75 mcg total) by mouth daily before breakfast. 90 tablet 1   SEROQUEL 50 MG tablet Take 1 tablet by mouth at bedtime.     No facility-administered medications prior to visit.    Allergies  Allergen Reactions   Aspirin Other (See Comments)    BLISTERS ON LIPS   Lipitor [Atorvastatin Calcium] Other (See Comments)    Myalgia    Trimethoprim Other (See Comments)    Unknown reaction   Vivelle [Estradiol] Other (See Comments)    "heart flutters"   Zetia [Ezetimibe] Other (See Comments)    Myalgias   Zocor [Simvastatin] Other (See Comments)    myalgias   Sulfa Antibiotics Rash    And blistering of lips    Review of Systems  Constitutional:  Positive for fatigue. Negative for activity change, appetite change, chills and fever.  HENT:  Positive for congestion, postnasal drip, rhinorrhea and sneezing. Negative for ear pain, sinus pressure, sinus pain and sore throat.   Eyes: Negative.   Respiratory:  Positive for cough. Negative for chest tightness, shortness of breath and wheezing.    Cardiovascular:  Negative for chest pain and palpitations.  Gastrointestinal:  Negative for abdominal pain, constipation, diarrhea, nausea and vomiting.  Endocrine: Negative for cold intolerance, heat intolerance, polydipsia and polyuria.  Genitourinary:  Negative for dyspareunia, dysuria, flank pain, frequency and urgency.  Musculoskeletal:  Negative for arthralgias, back pain and myalgias.  Skin:  Negative for rash.  Allergic/Immunologic: Negative for environmental allergies.  Neurological:  Positive for headaches. Negative for dizziness and weakness.  Hematological:  Negative for adenopathy.  Psychiatric/Behavioral:  The patient is not nervous/anxious.       Objective:    Physical  Exam Vitals and nursing note reviewed.  Constitutional:      Appearance: Normal appearance. She is well-developed.  HENT:     Head: Normocephalic and atraumatic.     Right Ear: Tympanic membrane, ear canal and external ear normal.     Left Ear: Tympanic membrane, ear canal and external ear normal.     Nose: Congestion present.     Mouth/Throat:     Mouth: Mucous membranes are moist.     Pharynx: Oropharynx is clear.  Eyes:     Conjunctiva/sclera: Conjunctivae normal.     Pupils: Pupils are equal, round, and reactive to light.  Cardiovascular:     Rate and Rhythm: Normal rate and regular rhythm.     Pulses: Normal pulses.     Heart sounds: Normal heart sounds.  Pulmonary:     Effort: Pulmonary effort is normal. No respiratory distress.     Breath sounds: Normal breath sounds. No wheezing or rhonchi.  Abdominal:     Palpations: Abdomen is soft.  Musculoskeletal:        General: Normal range of motion.     Cervical back: Normal range of motion and neck supple.  Skin:    General: Skin is warm and dry.     Capillary Refill: Capillary refill takes less than 2 seconds.  Neurological:     General: No focal deficit present.     Mental Status: She is alert and oriented to person, place, and time.   Psychiatric:        Mood and Affect: Mood normal.        Behavior: Behavior normal.        Thought Content: Thought content normal.        Judgment: Judgment normal.    Today's Vitals   02/16/21 1338  BP: 111/62  Pulse: 60  Temp: 98 F (36.7 C)  SpO2: 98%  Weight: 121 lb 11.2 oz (55.2 kg)  Height: _0  (1.575 m)   Body mass index is 22.26 kg/m.   Wt Readings from Last 3 Encounters:  02/16/21 121 lb 11.2 oz (55.2 kg)  12/29/20 121 lb (54.9 kg)  10/05/20 126 lb (57.2 kg)    Health Maintenance Due  Topic Date Due   COVID-19 Vaccine (4 - Booster for Moderna series) 02/13/2020   URINE MICROALBUMIN  03/30/2021    There are no preventive care reminders to display for this patient.   Lab Results  Component Value Date   TSH 0.805 12/29/2020   Lab Results  Component Value Date   WBC 5.7 01/30/2021   HGB 14.3 01/30/2021   HCT 43.9 01/30/2021   MCV 89 01/30/2021   PLT 154 01/30/2021   Lab Results  Component Value Date   NA 146 (H) 12/29/2020   K 4.0 12/29/2020   CO2 24 12/29/2020   GLUCOSE 114 (H) 12/29/2020   BUN 16 12/29/2020   CREATININE 1.33 (H) 12/29/2020   BILITOT 0.3 12/29/2020   ALKPHOS 71 12/29/2020   AST 29 12/29/2020   ALT 33 (H) 12/29/2020   PROT 6.8 12/29/2020   ALBUMIN 4.5 12/29/2020   CALCIUM 10.4 (H) 12/29/2020   EGFR 42 (L) 12/29/2020   Lab Results  Component Value Date   CHOL 194 12/29/2020   Lab Results  Component Value Date   HDL 58 12/29/2020   Lab Results  Component Value Date   LDLCALC 116 (H) 12/29/2020   Lab Results  Component Value Date   TRIG 112 12/29/2020  Lab Results  Component Value Date   CHOLHDL 3.3 12/29/2020   Lab Results  Component Value Date   HGBA1C 6.2 (A) 12/29/2020       Assessment & Plan:  1. Acute upper respiratory infection Per patient, symptoms are improving. Advise she continue to rest and drink plenty of fluids. She should coitue to take OTC medications as needed and as indicated to  improve symptoms. Advised her to contact the office if symptoms worsen or do not improve over next three to four days.    Problem List Items Addressed This Visit       Respiratory   Acute upper respiratory infection - Primary     Ronnell Freshwater, NP

## 2021-02-28 ENCOUNTER — Encounter: Payer: Self-pay | Admitting: Physician Assistant

## 2021-02-28 ENCOUNTER — Ambulatory Visit (INDEPENDENT_AMBULATORY_CARE_PROVIDER_SITE_OTHER): Payer: Medicare Other | Admitting: Physician Assistant

## 2021-02-28 ENCOUNTER — Other Ambulatory Visit: Payer: Self-pay

## 2021-02-28 VITALS — BP 111/59 | HR 62 | Temp 99.0°F | Ht 62.0 in | Wt 119.0 lb

## 2021-02-28 DIAGNOSIS — Z23 Encounter for immunization: Secondary | ICD-10-CM | POA: Diagnosis not present

## 2021-02-28 DIAGNOSIS — L089 Local infection of the skin and subcutaneous tissue, unspecified: Secondary | ICD-10-CM

## 2021-02-28 DIAGNOSIS — S61411A Laceration without foreign body of right hand, initial encounter: Secondary | ICD-10-CM

## 2021-02-28 MED ORDER — DOXYCYCLINE HYCLATE 100 MG PO TABS
100.0000 mg | ORAL_TABLET | Freq: Two times a day (BID) | ORAL | 0 refills | Status: DC
Start: 2021-02-28 — End: 2021-04-28

## 2021-02-28 NOTE — Patient Instructions (Signed)
Wound Care, Adult ?Taking care of your wound properly can help to prevent pain, infection, and scarring. It can also help your wound heal more quickly. Follow instructions from your health care provider about how to care for your wound. ?Supplies needed: ?Soap and water. ?Wound cleanser, saline, or germ-free (sterile) water. ?Gauze. ?If needed, a clean bandage (dressing) or other type of wound dressing material to cover or place in the wound. Follow your health care provider's instructions about what dressing supplies to use. ?Cream or topical ointment to apply to the wound, if told by your health care provider. ?How to care for your wound ?Cleaning the wound ?Ask your health care provider how to clean the wound. This may include: ?Using mild soap and water, a wound cleanser, saline, or sterile water. ?Using a clean gauze to pat the wound dry after cleaning it. Do not rub or scrub the wound. ?Dressing care ?Wash your hands with soap and water for at least 20 seconds before and after you change the dressing. If soap and water are not available, use hand sanitizer. ?Change your dressing as told by your health care provider. This may include: ?Cleaning or rinsing out (irrigating) the wound. ?Application of cream or topical ointment, if told by your health care provider. ?Placing a dressing over the wound or in the wound (packing). ?Covering the wound with an outer dressing. ?Leave stitches (sutures), staples, skin glue, or adhesive strips in place. These skin closures may need to stay in place for 2 weeks or longer. If adhesive strip edges start to loosen and curl up, you may trim the loose edges. Do not remove adhesive strips completely unless your health care provider tells you to do that. ?Ask your health care provider when you can leave the wound uncovered. ?Checking for infection ?Check your wound area every day for signs of infection. Check for: ?More redness, swelling, or pain. ?Fluid or blood. ?Warmth. ?Pus or  a bad smell. ? ?Follow these instructions at home ?Medicines ?If you were prescribed an antibiotic medicine, cream, or ointment, take or apply it as told by your health care provider. Do not stop using the antibiotic even if your condition improves. ?If you were prescribed pain medicine, take it 30 minutes before you do any wound care or as told by your health care provider. ?Take over-the-counter and prescription medicines only as told by your health care provider. ?Eating and drinking ?Eat a diet that includes protein, vitamin A, vitamin C, and other nutrient-rich foods to help the wound heal. ?Foods rich in protein include meat, fish, eggs, dairy, beans, and nuts. ?Foods rich in vitamin A include carrots and dark green, leafy vegetables. ?Foods rich in vitamin C include citrus fruits, tomatoes, broccoli, and peppers. ?Drink enough fluid to keep your urine pale yellow. ?General instructions ?Do not take baths, swim, or use a hot tub until your health care provider approves. Ask your health care provider if you may take showers. You may only be allowed to take sponge baths. ?Do not scratch or pick at the wound. Keep it covered as told by your health care provider. ?Return to your normal activities as told by your health care provider. Ask your health care provider what activities are safe for you. ?Protect your wound from the sun when you are outside for the first 6 months, or for as long as told by your health care provider. Cover up the scar area or apply sunscreen that has an SPF of at least 30. ?Do not   use any products that contain nicotine or tobacco. These products include cigarettes, chewing tobacco, and vaping devices, such as e-cigarettes. If you need help quitting, ask your health care provider. ?Keep all follow-up visits. This is important. ?Contact a health care provider if: ?You received a tetanus shot and you have swelling, severe pain, redness, or bleeding at the injection site. ?Your pain is not  controlled with medicine. ?You have any of these signs of infection: ?More redness, swelling, or pain around the wound. ?Fluid or blood coming from the wound. ?Warmth coming from the wound. ?A fever or chills. ?You are nauseous or you vomit. ?You are dizzy. ?You have a new rash or hardness around the wound. ?Get help right away if: ?You have a red streak of skin near the area around your wound. ?Pus or a bad smell coming from the wound. ?Your wound has been closed with staples, sutures, skin glue, or adhesive strips and it begins to open up and separate. ?Your wound is bleeding, and the bleeding does not stop with gentle pressure. ?These symptoms may represent a serious problem that is an emergency. Do not wait to see if the symptoms will go away. Get medical help right away. Call your local emergency services (911 in the [place] Do not drive yourself to the hospital. ?Summary ?Always wash your hands with soap and water for at least 20 seconds before and after changing your dressing. ?Change your dressing as told by your health care provider. ?To help with healing, eat foods that are rich in protein, vitamin A, vitamin C, and other nutrients. ?Check your wound every day for signs of infection. Contact your health care provider if you think that your wound is infected. ?This information is not intended to replace advice given to you by your health care provider. Make sure you discuss any questions you have with your health care provider. ?Document Revised: 06/07/2020 Document Reviewed: 06/07/2020 ?Elsevier Patient Education ? 2022 [employer] Inc[employer] ? ?

## 2021-02-28 NOTE — Progress Notes (Signed)
Acute Office Visit  Subjective:    Patient ID: [name], female    DOB: [birth year], 76 y.o.   MRN: [id number]  Chief Complaint  Patient presents with   Acute Visit    HPI Patient is in today for c/o of hand infection. Reports was putting groceries in her car last Friday and cut her right hand with the trunk. States cleaned area with hydrogen peroxide and washed it with soap and water. States redness around the cut which has spread and it is also tender. Has noticed some pus draining. Has applied neosporin.  Past Medical History:  Diagnosis Date   Bipolar disorder ([place])    Cataract    Phreesia 09/27/2019   Chills without fever    intermittant   Depression    Diabetes mellitus without complication ([place])    Phreesia 09/27/2019   History of colon polyps    2009 TUBULAR ADENOMA   History of concussion    02-05-2012--  fell and hit head (had previous falls prior to this day) dx mild concussion -- per MRI and neurologist note (dr Leonie Man)  right frontal lob w/ shear injuries consistant with fall/trauma   History of Graves' disease    s/p  radiactive iodine therapy 1989   History of primary hyperparathyroidism    W/  HYPERCALCEMIA--  s/p  left inferior parathyroidectomy 2014   Hyperlipidemia    Hypothyroidism, postradioiodine therapy 1989  approx   endocrinologist-  dr Gaylene Brooks [facility]   Increased urinary frequency    Osteoporosis    Thyroid disease    Phreesia 09/27/2019   Type 2 diabetes mellitus [facility])     Past Surgical History:  Procedure Laterality Date   ABDOMINAL HYSTERECTOMY N/A    Phreesia 09/27/2019   CHOLECYSTECTOMY N/A    Phreesia 09/27/2019   CYSTOSCOPY N/A 12/19/2015   Procedure: Rolly Salter UNDER ANESTHESIA;  Surgeon: Kathie Rhodes, MD;  Location: [place];  Service: Urology;  Laterality: N/A;   PARATHYROIDECTOMY Left 03/27/2012   Procedure: Left Inferior Mininally Invasive PARATHYROIDECTOMY;  Surgeon: Earnstine Regal, MD;  Location: [facility];  Service: General;  Laterality: Left;  Left Inferior Mininally Invasive Parathyroidectomy---  hypercellular adenoma   TRANSOBTURATOR SLING  09/01/2004   transvaginal mesh placement     TUBAL LIGATION  1978   VAGINAL HYSTERECTOMY  1986    WITH OVARIAN PRESERVATION     Family History  Problem Relation Age of Onset   Cancer Mother        MELANOMA   Diabetes Maternal Aunt    Heart attack Brother    Diabetes Maternal Uncle     Social History   Socioeconomic History   Marital status: Widowed    Spouse name: Not on file   Number of children: Not on file   Years of education: Not on file   Highest education level: Not on file  Occupational History   Not on file  Tobacco Use   Smoking status: Former    Packs/day: 1.00    Years: 30.00    Pack years: 30.00    Types: Cigarettes    Quit date: 03/18/1997    Years since quitting: 23.9   Smokeless tobacco: Never  Vaping Use   Vaping Use: Never used  Substance and Sexual Activity   Alcohol use: No    Alcohol/week: 0.0 standard drinks   Drug use: No   Sexual activity: Not Currently    Birth control/protection: Surgical  Other Topics  Concern   Not on file  Social History Narrative   Not on file   Social Determinants of Health   Financial Resource Strain: Not on file  Food Insecurity: Not on file  Transportation Needs: Not on file  Physical Activity: Not on file  Stress: Not on file  Social Connections: Not on file  Intimate Partner Violence: Not on file    Outpatient Medications Prior to Visit  Medication Sig Dispense Refill   acetaminophen (TYLENOL) 500 MG tablet Take 500 mg by mouth every 6 (six) hours as needed. For pain     Baclofen 5 MG TABS Take 1 tablet by mouth twice daily as needed. 30 tablet 0   calcipotriene-betamethasone (TACLONEX SCALP) external suspension Apply topically daily as needed.      clobetasol cream (TEMOVATE) 4.97 % Apply 1 application topically 2 (two) times daily as  needed.      fluticasone (CUTIVATE) 0.05 % cream Apply topically 2 (two) times daily as needed.      fluticasone (FLONASE) 50 MCG/ACT nasal spray SHAKE LIQUID AND USE 1 SPRAY IN EACH NOSTRIL DAILY 16 g 0   FREESTYLE LITE test strip Use to check blood sugars every morning fasting and 2 hours after largest meal 100 each 11   ketoconazole (NIZORAL) 2 % cream Apply 1 application topically daily as needed.      Lancets (FREESTYLE) lancets Use to check blood sugars every morning fasting and 2 hours after largest meal 100 each 11   levothyroxine (SYNTHROID) 75 MCG tablet Take 1 tablet (75 mcg total) by mouth daily before breakfast. 90 tablet 1   SEROQUEL 50 MG tablet Take 1 tablet by mouth at bedtime.     No facility-administered medications prior to visit.    Allergies  Allergen Reactions   Aspirin Other (See Comments)    BLISTERS ON LIPS   Lipitor [Atorvastatin Calcium] Other (See Comments)    Myalgia    Trimethoprim Other (See Comments)    Unknown reaction   Vivelle [Estradiol] Other (See Comments)    "heart flutters"   Zetia [Ezetimibe] Other (See Comments)    Myalgias   Zocor [Simvastatin] Other (See Comments)    myalgias   Sulfa Antibiotics Rash    And blistering of lips    Review of Systems Review of Systems:  A fourteen system review of systems was performed and found to be positive as per HPI. Objective:    Physical Exam General:  Well Developed, well nourished, appropriate for stated age.  Neuro:  Alert and oriented,  extra-ocular muscles intact  HEENT:  Normocephalic, atraumatic, neck supple Skin: triangular laceration 1.5 cm x 1.5 cm of right hand with surrounding erythema, warmth and mild purulent drainage noted Cardiac:  RRR, S1 S2 Respiratory: Not using accessory muscles, speaking in full sentences- unlabored. Vascular:  Ext warm, no cyanosis apprec. Psych:  No HI/SI, judgement and insight good, Euthymic mood. Full Affect.  BP (!) 111/59    Pulse 62    Temp 99 F  (37.2 C)    Ht '5\' 2"'  (1.575 m)    Wt 119 lb (54 kg)    SpO2 99%    BMI 21.77 kg/m  Wt Readings from Last 3 Encounters:  02/28/21 119 lb (54 kg)  02/16/21 121 lb 11.2 oz (55.2 kg)  12/29/20 121 lb (54.9 kg)    Health Maintenance Due  Topic Date Due   COVID-19 Vaccine (4 - Booster for Moderna series) 02/13/2020   URINE MICROALBUMIN  03/30/2021  There are no preventive care reminders to display for this patient.   Lab Results  Component Value Date   TSH 0.805 12/29/2020   Lab Results  Component Value Date   WBC 5.7 01/30/2021   HGB 14.3 01/30/2021   HCT 43.9 01/30/2021   MCV 89 01/30/2021   PLT 154 01/30/2021   Lab Results  Component Value Date   NA 146 (H) 12/29/2020   K 4.0 12/29/2020   CO2 24 12/29/2020   GLUCOSE 114 (H) 12/29/2020   BUN 16 12/29/2020   CREATININE 1.33 (H) 12/29/2020   BILITOT 0.3 12/29/2020   ALKPHOS 71 12/29/2020   AST 29 12/29/2020   ALT 33 (H) 12/29/2020   PROT 6.8 12/29/2020   ALBUMIN 4.5 12/29/2020   CALCIUM 10.4 (H) 12/29/2020   EGFR 42 (L) 12/29/2020   Lab Results  Component Value Date   CHOL 194 12/29/2020   Lab Results  Component Value Date   HDL 58 12/29/2020   Lab Results  Component Value Date   LDLCALC 116 (H) 12/29/2020   Lab Results  Component Value Date   TRIG 112 12/29/2020   Lab Results  Component Value Date   CHOLHDL 3.3 12/29/2020   Lab Results  Component Value Date   HGBA1C 6.2 (A) 12/29/2020       Assessment & Plan:   Problem List Items Addressed This Visit   None Visit Diagnoses     Laceration of right hand with infection, initial encounter    -  Primary   Relevant Medications   doxycycline (VIBRA-TABS) 100 MG tablet      Patient has signs of skin infection so will start oral antibiotic therapy. Laceration occurred 4 days ago so do not recommend primary intervention, continue with secondary prevention closure. Advised to monitor for worsening  redness or increased drainage and should seek  immediate medical care. Discussed wound care and recommend to continue with topical antibiotic therapy, keep area clean and dry. Follow up in 1 week. Will update tetanus vaccine, >5 years.  Meds ordered this encounter  Medications   doxycycline (VIBRA-TABS) 100 MG tablet    Sig: Take 1 tablet (100 mg total) by mouth 2 (two) times daily.    Dispense:  20 tablet    Refill:  0    Order Specific Question:   Supervising Provider    Answer:   Beatrice Lecher D [[id number]]     Lorrene Reid, PA-C

## 2021-02-28 NOTE — Progress Notes (Deleted)
Acute Office Visit  Subjective:    Patient ID: [name], female    DOB: [birth year], 76 y.o.   MRN: [id number]  Chief Complaint  Patient presents with   Acute Visit    HPI Patient is in today for ***  Past Medical History:  Diagnosis Date   Bipolar disorder ([place])    Cataract    Phreesia 09/27/2019   Chills without fever    intermittant   Depression    Diabetes mellitus without complication ([place])    Phreesia 09/27/2019   History of colon polyps    2009 TUBULAR ADENOMA   History of concussion    02-05-2012--  fell and hit head (had previous falls prior to this day) dx mild concussion -- per MRI and neurologist note (dr Leonie Man)  right frontal lob w/ shear injuries consistant with fall/trauma   History of Graves' disease    s/p  radiactive iodine therapy 1989   History of primary hyperparathyroidism    W/  HYPERCALCEMIA--  s/p  left inferior parathyroidectomy 2014   Hyperlipidemia    Hypothyroidism, postradioiodine therapy 1989  approx   endocrinologist-  dr Gaylene Brooks [facility]   Increased urinary frequency    Osteoporosis    Thyroid disease    Phreesia 09/27/2019   Type 2 diabetes mellitus [facility])     Past Surgical History:  Procedure Laterality Date   ABDOMINAL HYSTERECTOMY N/A    Phreesia 09/27/2019   CHOLECYSTECTOMY N/A    Phreesia 09/27/2019   CYSTOSCOPY N/A 12/19/2015   Procedure: Rolly Salter UNDER ANESTHESIA;  Surgeon: Kathie Rhodes, MD;  Location: [place];  Service: Urology;  Laterality: N/A;   PARATHYROIDECTOMY Left 03/27/2012   Procedure: Left Inferior Mininally Invasive PARATHYROIDECTOMY;  Surgeon: Earnstine Regal, MD;  [facility];  Service: General;  Laterality: Left;  Left Inferior Mininally Invasive Parathyroidectomy---  hypercellular adenoma   TRANSOBTURATOR SLING  09/01/2004   transvaginal mesh placement     TUBAL LIGATION  1978   VAGINAL HYSTERECTOMY  1986    WITH OVARIAN PRESERVATION     Family  History  Problem Relation Age of Onset   Cancer Mother        MELANOMA   Diabetes Maternal Aunt    Heart attack Brother    Diabetes Maternal Uncle     Social History   Socioeconomic History   Marital status: Widowed    Spouse name: Not on file   Number of children: Not on file   Years of education: Not on file   Highest education level: Not on file  Occupational History   Not on file  Tobacco Use   Smoking status: Former    Packs/day: 1.00    Years: 30.00    Pack years: 30.00    Types: Cigarettes    Quit date: 03/18/1997    Years since quitting: 23.9   Smokeless tobacco: Never  Vaping Use   Vaping Use: Never used  Substance and Sexual Activity   Alcohol use: No    Alcohol/week: 0.0 standard drinks   Drug use: No   Sexual activity: Not Currently    Birth control/protection: Surgical  Other Topics Concern   Not on file  Social History Narrative   Not on file   Social Determinants of Health   Financial Resource Strain: Not on file  Food Insecurity: Not on file  Transportation Needs: Not on file  Physical Activity: Not on file  Stress: Not on file  Social  Connections: Not on file  Intimate Partner Violence: Not on file    Outpatient Medications Prior to Visit  Medication Sig Dispense Refill   acetaminophen (TYLENOL) 500 MG tablet Take 500 mg by mouth every 6 (six) hours as needed. For pain     Baclofen 5 MG TABS Take 1 tablet by mouth twice daily as needed. 30 tablet 0   calcipotriene-betamethasone (TACLONEX SCALP) external suspension Apply topically daily as needed.      clobetasol cream (TEMOVATE) 8.37 % Apply 1 application topically 2 (two) times daily as needed.      fluticasone (CUTIVATE) 0.05 % cream Apply topically 2 (two) times daily as needed.      fluticasone (FLONASE) 50 MCG/ACT nasal spray SHAKE LIQUID AND USE 1 SPRAY IN EACH NOSTRIL DAILY 16 g 0   FREESTYLE LITE test strip Use to check blood sugars every morning fasting and 2 hours after largest meal  100 each 11   ketoconazole (NIZORAL) 2 % cream Apply 1 application topically daily as needed.      Lancets (FREESTYLE) lancets Use to check blood sugars every morning fasting and 2 hours after largest meal 100 each 11   levothyroxine (SYNTHROID) 75 MCG tablet Take 1 tablet (75 mcg total) by mouth daily before breakfast. 90 tablet 1   SEROQUEL 50 MG tablet Take 1 tablet by mouth at bedtime.     No facility-administered medications prior to visit.    Allergies  Allergen Reactions   Aspirin Other (See Comments)    BLISTERS ON LIPS   Lipitor [Atorvastatin Calcium] Other (See Comments)    Myalgia    Trimethoprim Other (See Comments)    Unknown reaction   Vivelle [Estradiol] Other (See Comments)    "heart flutters"   Zetia [Ezetimibe] Other (See Comments)    Myalgias   Zocor [Simvastatin] Other (See Comments)    myalgias   Sulfa Antibiotics Rash    And blistering of lips    Review of Systems A fourteen system review of systems was performed and found to be positive as per HPI.    Objective:    Physical Exam General:  Well Developed, well nourished, appropriate for stated age.  Neuro:  Alert and oriented,  extra-ocular muscles intact  HEENT:  Normocephalic, atraumatic, neck supple, no carotid bruits appreciated  Skin: triangular laceration 1.5 cm x 1.5 cm with surrounding erythema, warmth and mild purulent drainage.  Respiratory:  Not using accessory muscles, speaking in full sentences- unlabored. Vascular:  Ext warm, no cyanosis apprec.; cap RF less 2 sec. Psych:  No HI/SI, judgement and insight good, Euthymic mood. Full Affect.  BP (!) 111/59    Pulse 62    Temp 99 F (37.2 C)    Ht '5\' 2"'  (1.575 m)    Wt 119 lb (54 kg)    SpO2 99%    BMI 21.77 kg/m  Wt Readings from Last 3 Encounters:  02/28/21 119 lb (54 kg)  02/16/21 121 lb 11.2 oz (55.2 kg)  12/29/20 121 lb (54.9 kg)    Health Maintenance Due  Topic Date Due   COVID-19 Vaccine (4 - Booster for Moderna series)  02/13/2020   URINE MICROALBUMIN  03/30/2021    There are no preventive care reminders to display for this patient.   Lab Results  Component Value Date   TSH 0.805 12/29/2020   Lab Results  Component Value Date   WBC 5.7 01/30/2021   HGB 14.3 01/30/2021   HCT 43.9 01/30/2021  MCV 89 01/30/2021   PLT 154 01/30/2021   Lab Results  Component Value Date   NA 146 (H) 12/29/2020   K 4.0 12/29/2020   CO2 24 12/29/2020   GLUCOSE 114 (H) 12/29/2020   BUN 16 12/29/2020   CREATININE 1.33 (H) 12/29/2020   BILITOT 0.3 12/29/2020   ALKPHOS 71 12/29/2020   AST 29 12/29/2020   ALT 33 (H) 12/29/2020   PROT 6.8 12/29/2020   ALBUMIN 4.5 12/29/2020   CALCIUM 10.4 (H) 12/29/2020   EGFR 42 (L) 12/29/2020   Lab Results  Component Value Date   CHOL 194 12/29/2020   Lab Results  Component Value Date   HDL 58 12/29/2020   Lab Results  Component Value Date   LDLCALC 116 (H) 12/29/2020   Lab Results  Component Value Date   TRIG 112 12/29/2020   Lab Results  Component Value Date   CHOLHDL 3.3 12/29/2020   Lab Results  Component Value Date   HGBA1C 6.2 (A) 12/29/2020       Assessment & Plan:   Problem List Items Addressed This Visit   None Visit Diagnoses     Laceration of right hand with infection, initial encounter    -  Primary   Relevant Medications   doxycycline (VIBRA-TABS) 100 MG tablet   Other Relevant Orders   Tdap vaccine greater than or equal to 7yo IM (Completed)   Need for Tdap vaccination       Relevant Orders   Tdap vaccine greater than or equal to 7yo IM (Completed)        Meds ordered this encounter  Medications   doxycycline (VIBRA-TABS) 100 MG tablet    Sig: Take 1 tablet (100 mg total) by mouth 2 (two) times daily.    Dispense:  20 tablet    Refill:  0    Order Specific Question:   Supervising Provider    Answer:   Beatrice Lecher D [[id number]]     Lorrene Reid, PA-C

## 2021-03-06 NOTE — Progress Notes (Signed)
Established Patient Office Visit  Subjective:  Patient ID: [name], female    DOB: February 02, 1946  Age: 76 y.o. MRN: [id number]  CC:  Chief Complaint  Patient presents with   Follow-up    HPI [name] presents for follow up on laceration infection. Patient reports overall tolerating doxycycline without significant issues. Reports minor upset stomach. States laceration is not as painful as before and has not noticed anymore pus. No new symptoms such as fever or chills.  Past Medical History:  Diagnosis Date   Bipolar disorder ([place])    Cataract    Phreesia 09/27/2019   Chills without fever    intermittant   Depression    Diabetes mellitus without complication ([place])    Phreesia 09/27/2019   History of colon polyps    2009 TUBULAR ADENOMA   History of concussion    02-05-2012--  fell and hit head (had previous falls prior to this day) dx mild concussion -- per MRI and neurologist note (dr Leonie Man)  right frontal lob w/ shear injuries consistant with fall/trauma   History of Graves' disease    s/p  radiactive iodine therapy 1989   History of primary hyperparathyroidism    W/  HYPERCALCEMIA--  s/p  left inferior parathyroidectomy 2014   Hyperlipidemia    Hypothyroidism, postradioiodine therapy 1989  approx   endocrinologist-  dr Gaylene Brooks [facility]   Increased urinary frequency    Osteoporosis    Thyroid disease    Phreesia 09/27/2019   Type 2 diabetes mellitus [facility])     Past Surgical History:  Procedure Laterality Date   ABDOMINAL HYSTERECTOMY N/A    Phreesia 09/27/2019   CHOLECYSTECTOMY N/A    Phreesia 09/27/2019   CYSTOSCOPY N/A 12/19/2015   Procedure: Rolly Salter UNDER ANESTHESIA;  Surgeon: Kathie Rhodes, MD;  Location: [place];  Service: Urology;  Laterality: N/A;   PARATHYROIDECTOMY Left 03/27/2012   Procedure: Left Inferior Mininally Invasive PARATHYROIDECTOMY;  Surgeon: Earnstine Regal, MD;  Location: [facility];  Service:  General;  Laterality: Left;  Left Inferior Mininally Invasive Parathyroidectomy---  hypercellular adenoma   TRANSOBTURATOR SLING  09/01/2004   transvaginal mesh placement     TUBAL LIGATION  1978   VAGINAL HYSTERECTOMY  1986    WITH OVARIAN PRESERVATION     Family History  Problem Relation Age of Onset   Cancer Mother        MELANOMA   Diabetes Maternal Aunt    Heart attack Brother    Diabetes Maternal Uncle     Social History   Socioeconomic History   Marital status: Widowed    Spouse name: Not on file   Number of children: Not on file   Years of education: Not on file   Highest education level: Not on file  Occupational History   Not on file  Tobacco Use   Smoking status: Former    Packs/day: 1.00    Years: 30.00    Pack years: 30.00    Types: Cigarettes    Quit date: 03/18/1997    Years since quitting: 23.9   Smokeless tobacco: Never  Vaping Use   Vaping Use: Never used  Substance and Sexual Activity   Alcohol use: No    Alcohol/week: 0.0 standard drinks   Drug use: No   Sexual activity: Not Currently    Birth control/protection: Surgical  Other Topics Concern   Not on file  Social History Narrative   Not on file  Social Determinants of Health   Financial Resource Strain: Not on file  Food Insecurity: Not on file  Transportation Needs: Not on file  Physical Activity: Not on file  Stress: Not on file  Social Connections: Not on file  Intimate Partner Violence: Not on file    Outpatient Medications Prior to Visit  Medication Sig Dispense Refill   acetaminophen (TYLENOL) 500 MG tablet Take 500 mg by mouth every 6 (six) hours as needed. For pain     Baclofen 5 MG TABS Take 1 tablet by mouth twice daily as needed. 30 tablet 0   calcipotriene-betamethasone (TACLONEX SCALP) external suspension Apply topically daily as needed.      clobetasol cream (TEMOVATE) 8.54 % Apply 1 application topically 2 (two) times daily as needed.      doxycycline (VIBRA-TABS)  100 MG tablet Take 1 tablet (100 mg total) by mouth 2 (two) times daily. 20 tablet 0   fluticasone (CUTIVATE) 0.05 % cream Apply topically 2 (two) times daily as needed.      fluticasone (FLONASE) 50 MCG/ACT nasal spray SHAKE LIQUID AND USE 1 SPRAY IN EACH NOSTRIL DAILY 16 g 0   FREESTYLE LITE test strip Use to check blood sugars every morning fasting and 2 hours after largest meal 100 each 11   ketoconazole (NIZORAL) 2 % cream Apply 1 application topically daily as needed.      Lancets (FREESTYLE) lancets Use to check blood sugars every morning fasting and 2 hours after largest meal 100 each 11   levothyroxine (SYNTHROID) 75 MCG tablet Take 1 tablet (75 mcg total) by mouth daily before breakfast. 90 tablet 1   SEROQUEL 50 MG tablet Take 1 tablet by mouth at bedtime.     No facility-administered medications prior to visit.    Allergies  Allergen Reactions   Aspirin Other (See Comments)    BLISTERS ON LIPS   Lipitor [Atorvastatin Calcium] Other (See Comments)    Myalgia    Trimethoprim Other (See Comments)    Unknown reaction   Vivelle [Estradiol] Other (See Comments)    "heart flutters"   Zetia [Ezetimibe] Other (See Comments)    Myalgias   Zocor [Simvastatin] Other (See Comments)    myalgias   Sulfa Antibiotics Rash    And blistering of lips    ROS Review of Systems Review of Systems:  A fourteen system review of systems was performed and found to be positive as per HPI.   Objective:    Physical Exam General:  Well Developed, well nourished, appropriate for stated age.  Neuro:  Alert and oriented,  extra-ocular muscles intact  HEENT:  Normocephalic, atraumatic, neck supple  Skin:  triangular laceration 1 cm x 1 cm of right hand without warmth or erythema, no purulent drainage.  Cardiac:  RRR, S1 S2 Respiratory: CTA B/L  Vascular:  Ext warm, no cyanosis apprec.; cap RF less 2 sec. Psych:  No HI/SI, judgement and insight good, Euthymic mood. Full Affect.  BP 97/60     Pulse 64    Temp 97.6 F (36.4 C)    Ht _0  (1.575 m)    Wt 119 lb (54 kg)    SpO2 99%    BMI 21.77 kg/m  Wt Readings from Last 3 Encounters:  03/07/21 119 lb (54 kg)  02/28/21 119 lb (54 kg)  02/16/21 121 lb 11.2 oz (55.2 kg)     Health Maintenance Due  Topic Date Due   COVID-19 Vaccine (4 - Booster for [employer]  series) 02/13/2020   URINE MICROALBUMIN  03/30/2021    There are no preventive care reminders to display for this patient.  Lab Results  Component Value Date   TSH 0.805 12/29/2020   Lab Results  Component Value Date   WBC 5.7 01/30/2021   HGB 14.3 01/30/2021   HCT 43.9 01/30/2021   MCV 89 01/30/2021   PLT 154 01/30/2021   Lab Results  Component Value Date   NA 146 (H) 12/29/2020   K 4.0 12/29/2020   CO2 24 12/29/2020   GLUCOSE 114 (H) 12/29/2020   BUN 16 12/29/2020   CREATININE 1.33 (H) 12/29/2020   BILITOT 0.3 12/29/2020   ALKPHOS 71 12/29/2020   AST 29 12/29/2020   ALT 33 (H) 12/29/2020   PROT 6.8 12/29/2020   ALBUMIN 4.5 12/29/2020   CALCIUM 10.4 (H) 12/29/2020   EGFR 42 (L) 12/29/2020   Lab Results  Component Value Date   CHOL 194 12/29/2020   Lab Results  Component Value Date   HDL 58 12/29/2020   Lab Results  Component Value Date   LDLCALC 116 (H) 12/29/2020   Lab Results  Component Value Date   TRIG 112 12/29/2020   Lab Results  Component Value Date   CHOLHDL 3.3 12/29/2020   Lab Results  Component Value Date   HGBA1C 6.2 (A) 12/29/2020      Assessment & Plan:   Problem List Items Addressed This Visit   None Visit Diagnoses     Laceration of right hand with infection, subsequent encounter    -  Primary      Laceration improving with healthy signs of healing. Wound has reduced in size. Recommend to complete antibiotic as directed. Continue to keep area clean and dry. Follow up as needed.   No orders of the defined types were placed in this encounter.   Follow-up: Return for Return as scheduled.    Lorrene Reid, PA-C

## 2021-03-07 ENCOUNTER — Ambulatory Visit (INDEPENDENT_AMBULATORY_CARE_PROVIDER_SITE_OTHER): Payer: Medicare Other | Admitting: Physician Assistant

## 2021-03-07 ENCOUNTER — Encounter: Payer: Self-pay | Admitting: Physician Assistant

## 2021-03-07 ENCOUNTER — Other Ambulatory Visit: Payer: Self-pay

## 2021-03-07 VITALS — BP 97/60 | HR 64 | Temp 97.6°F | Ht 62.0 in | Wt 119.0 lb

## 2021-03-07 DIAGNOSIS — L089 Local infection of the skin and subcutaneous tissue, unspecified: Secondary | ICD-10-CM | POA: Diagnosis not present

## 2021-03-07 DIAGNOSIS — S61411D Laceration without foreign body of right hand, subsequent encounter: Secondary | ICD-10-CM | POA: Diagnosis not present

## 2021-03-22 ENCOUNTER — Ambulatory Visit
Admission: RE | Admit: 2021-03-22 | Discharge: 2021-03-22 | Disposition: A | Discharge status: Home / Self Care | Payer: Medicare Other | Source: Ambulatory Visit | Attending: Physician Assistant | Admitting: Physician Assistant

## 2021-03-22 DIAGNOSIS — F3174 Bipolar disorder, in full remission, most recent episode manic: Secondary | ICD-10-CM | POA: Diagnosis not present

## 2021-03-22 DIAGNOSIS — Z79899 Other long term (current) drug therapy: Secondary | ICD-10-CM | POA: Diagnosis not present

## 2021-03-22 DIAGNOSIS — F5105 Insomnia due to other mental disorder: Secondary | ICD-10-CM | POA: Diagnosis not present

## 2021-03-22 DIAGNOSIS — Z780 Asymptomatic menopausal state: Secondary | ICD-10-CM

## 2021-03-22 DIAGNOSIS — E2839 Other primary ovarian failure: Secondary | ICD-10-CM

## 2021-03-22 DIAGNOSIS — M8589 Other specified disorders of bone density and structure, multiple sites: Secondary | ICD-10-CM | POA: Diagnosis not present

## 2021-03-23 ENCOUNTER — Encounter: Payer: Self-pay | Admitting: Physician Assistant

## 2021-03-23 DIAGNOSIS — E1122 Type 2 diabetes mellitus with diabetic chronic kidney disease: Secondary | ICD-10-CM

## 2021-03-23 MED ORDER — FREESTYLE LANCETS MISC: 11 refills | Status: AC

## 2021-03-29 ENCOUNTER — Encounter: Payer: Self-pay | Admitting: Physician Assistant

## 2021-03-29 DIAGNOSIS — E1122 Type 2 diabetes mellitus with diabetic chronic kidney disease: Secondary | ICD-10-CM

## 2021-03-29 MED ORDER — FREESTYLE LITE TEST VI STRP: ORAL_STRIP | 11 refills | Status: DC

## 2021-04-28 ENCOUNTER — Ambulatory Visit (INDEPENDENT_AMBULATORY_CARE_PROVIDER_SITE_OTHER): Payer: Medicare Other | Admitting: Physician Assistant

## 2021-04-28 ENCOUNTER — Other Ambulatory Visit: Payer: Self-pay

## 2021-04-28 ENCOUNTER — Encounter: Payer: Self-pay | Admitting: Physician Assistant

## 2021-04-28 VITALS — BP 116/64 | HR 68 | Temp 97.6°F | Ht 62.0 in | Wt 120.0 lb

## 2021-04-28 DIAGNOSIS — E785 Hyperlipidemia, unspecified: Secondary | ICD-10-CM | POA: Diagnosis not present

## 2021-04-28 DIAGNOSIS — E039 Hypothyroidism, unspecified: Secondary | ICD-10-CM

## 2021-04-28 DIAGNOSIS — F319 Bipolar disorder, unspecified: Secondary | ICD-10-CM | POA: Diagnosis not present

## 2021-04-28 DIAGNOSIS — E1122 Type 2 diabetes mellitus with diabetic chronic kidney disease: Secondary | ICD-10-CM

## 2021-04-28 DIAGNOSIS — K7460 Unspecified cirrhosis of liver: Secondary | ICD-10-CM

## 2021-04-28 LAB — POCT UA - MICROALBUMIN
Albumin/Creatinine Ratio, Urine, POC: <30
Creatinine, POC: 200 mg/dL
Microalbumin Ur, POC: 10 mg/L

## 2021-04-28 LAB — POCT GLYCOSYLATED HEMOGLOBIN (HGB A1C): Hemoglobin A1C: 6.3 % — AB (ref 4.0–5.6)

## 2021-04-28 MED ORDER — LEVOTHYROXINE SODIUM 75 MCG PO TABS: 75.0000 ug | ORAL_TABLET | Freq: Every day | ORAL | 2 refills | Status: DC

## 2021-04-28 NOTE — Assessment & Plan Note (Signed)
-  Stable. ?-Continue to follow up with Psychiatry. ?-Continue current medication regimen. ?-Will continue to monitor. ?

## 2021-04-28 NOTE — Progress Notes (Signed)
?Established patient visit ? ? ?Patient: [name]   DOB: 1945/06/15   76 y.o. Female  MRN: [id number] ?Visit Date: 04/28/2021 ? ?Chief Complaint  ?Patient presents with  ? Follow-up  ? Diabetes  ? Hyperlipidemia  ? Thyroid Problem  ? ?Subjective  ?  ?HPI  ?Patient presents for follow-up on diabetes mellitus, hypothyroidism, and hyperlipidemia.  ? ?Diabetes mellitus: Pt denies increased urination or thirst. Pt has been managing with diet. No hypoglycemic events. Checking glucose at home. FBS range from 110-120. Continues to monitor her starches and sugar intake. Continues with dancing.  ? ?HLD: Pt managing with diet. Reports limits fried foods and eats mostly salad. Cooks in the slow cooker. ? ?Hypothyroidism: Reports medication compliance. Denies fatigue, heat/cold intolerance or skin changes. ? ?Mood: Followed by Psychiatry. Taking medications as directed without issues. Denies labile mood or SI/HI. ? ?Depression screen [facility] 2/9 04/28/2021 02/28/2021 02/16/2021 12/29/2020 10/05/2020  ?Decreased Interest 0 0 0 0 0  ?Down, Depressed, Hopeless 0 0 0 0 0  ?PHQ - 2 Score 0 0 0 0 0  ?Altered sleeping 0 0 0 0 0  ?Tired, decreased energy 0 0 0 0 0  ?Change in appetite 0 0 0 0 0  ?Feeling bad or failure about yourself  0 0 0 0 0  ?Trouble concentrating 1 0 0 0 0  ?Moving slowly or fidgety/restless 0 0 0 0 0  ?Suicidal thoughts 0 0 0 0 0  ?PHQ-9 Score 1 0 0 0 0  ?Difficult doing work/chores - Not difficult at all - Not difficult at all -  ?Some recent data might be hidden  ? ?GAD 7 : Generalized Anxiety Score 04/28/2021 02/28/2021 02/16/2021 12/29/2020  ?Nervous, Anxious, on Edge 0 0 0 0  ?Control/stop worrying 0 0 0 0  ?Worry too much - different things 0 0 0 0  ?Trouble relaxing 0 0 1 0  ?Restless 0 0 0 0  ?Easily annoyed or irritable 0 0 0 0  ?Afraid - awful might happen 1 0 0 0  ?Total GAD 7 Score 1 0 1 0  ?Anxiety Difficulty Not difficult at all Not difficult at all - Not difficult at all   ? ? ? ? ? ?Medications: ?Outpatient Medications Prior to Visit  ?Medication Sig  ? acetaminophen (TYLENOL) 500 MG tablet Take 500 mg by mouth every 6 (six) hours as needed. For pain  ? Baclofen 5 MG TABS Take 1 tablet by mouth twice daily as needed.  ? calcipotriene-betamethasone (TACLONEX SCALP) external suspension Apply topically daily as needed.   ? clobetasol cream (TEMOVATE) 2.59 % Apply 1 application topically 2 (two) times daily as needed.   ? fluticasone (CUTIVATE) 0.05 % cream Apply topically 2 (two) times daily as needed.   ? fluticasone (FLONASE) 50 MCG/ACT nasal spray SHAKE LIQUID AND USE 1 SPRAY IN EACH NOSTRIL DAILY  ? FREESTYLE LITE test strip Use to check blood sugars every morning fasting and 2 hours after largest meal  ? ketoconazole (NIZORAL) 2 % cream Apply 1 application topically daily as needed.   ? Lancets (FREESTYLE) lancets Use to check blood sugars every morning fasting and 2 hours after largest meal  ? SEROQUEL 50 MG tablet Take 1 tablet by mouth at bedtime.  ? [DISCONTINUED] levothyroxine (SYNTHROID) 75 MCG tablet Take 1 tablet (75 mcg total) by mouth daily before breakfast.  ? [DISCONTINUED] doxycycline (VIBRA-TABS) 100 MG tablet Take 1 tablet (100 mg total) by mouth 2 (two) times daily.  ? ?  No facility-administered medications prior to visit.  ? ? ?Review of Systems ?Review of Systems:  ?A fourteen system review of systems was performed and found to be positive as per HPI. ? ? ? ?  Objective  ?  ?BP 116/64   Pulse 68   Temp 97.6 ?F (36.4 ?C)   Ht '5\' 2"'  (1.575 m)   Wt 120 lb (54.4 kg)   SpO2 98%   BMI 21.95 kg/m?  ?BP Readings from Last 3 Encounters:  ?04/28/21 116/64  ?03/07/21 97/60  ?02/28/21 (!) 111/59  ? ?Wt Readings from Last 3 Encounters:  ?04/28/21 120 lb (54.4 kg)  ?03/07/21 119 lb (54 kg)  ?02/28/21 119 lb (54 kg)  ? ? ?Physical Exam  ?General:  Well Developed, well nourished, appropriate for stated age.  ?Neuro:  Alert and oriented,  extra-ocular muscles intact  ?HEENT:   Normocephalic, atraumatic, neck supple  ?Skin:  no gross rash, warm, pink. ?Cardiac:  RRR, S1 S2 ?Respiratory: CTA B/L  ?Vascular:  Ext warm, no cyanosis apprec.; cap RF less 2 sec. ?Psych:  No HI/SI, judgement and insight good, Euthymic mood. Full Affect. ? ? ?Results for orders placed or performed in visit on 04/28/21  ?POCT glycosylated hemoglobin (Hb A1C)  ?Result Value Ref Range  ? Hemoglobin A1C 6.3 (A) 4.0 - 5.6 %  ? HbA1c POC (<> result, manual entry)    ? HbA1c, POC (prediabetic range)    ? HbA1c, POC (controlled diabetic range)    ?POCT UA - Microalbumin  ?Result Value Ref Range  ? Microalbumin Ur, POC 10 mg/L  ? Creatinine, POC 200 mg/dL  ? Albumin/Creatinine Ratio, Urine, POC <30   ? ? Assessment & Plan  ?  ? ? ?Problem List Items Addressed This Visit   ? ?  ? Digestive  ? Cirrhosis, nonalcoholic ([place])  ?  -Followed by Gastroenterology. ?-Continue to avoid fatty foods. ?-Will repeat CMP to monitor hepatic function. ?  ?  ?  ? Endocrine  ? Type 2 diabetes mellitus (HCC) - Primary (Chronic)  ?  -Controlled with diet. A1c today stable at 6.3. ?-Continue ambulatory glucose monitoring. ?-UA microalbumin collected, A:C <30. ?-Will continue to monitor. ?  ?  ? Relevant Orders  ? POCT glycosylated hemoglobin (Hb A1C) (Completed)  ? POCT UA - Microalbumin (Completed)  ? CBC w/Diff  ? Comp Met (CMET)  ? Hypothyroidism  ?  -Last TSH wnl at 0.805 ?-Continue current medication regimen. Provided refill. ?-Rechecking thyroid labs today. Pending results will make medication adjustments if indicated. ? ?  ?  ? Relevant Medications  ? levothyroxine (SYNTHROID) 75 MCG tablet  ? Other Relevant Orders  ? TSH  ? T4, free  ?  ? Other  ? Hyperlipidemia (Chronic)  ?  -Not candidate for statin therapy with hx of non-alcoholic cirrhosis. Encourage to continue with low fat diet and stay as active as possible. Will repeat lipid panel today. ?  ?  ? Relevant Orders  ? Lipid Profile  ? Bipolar 1 disorder ([place])  ?  -Stable. ?-Continue  to follow up with Psychiatry. ?-Continue current medication regimen. ?-Will continue to monitor. ?  ?  ? ? ?Return in about 5 months (around 09/28/2021) for [facility] (ok to schedule w// [employer]).  ?   ? ? ? ?Lorrene Reid, PA-C  ?[facility] ?[phone] (phone) ?[phone] (fax) ? ?[facility] ?

## 2021-04-28 NOTE — Assessment & Plan Note (Signed)
-  Last TSH wnl at 0.805 ?-Continue current medication regimen. Provided refill. ?-Rechecking thyroid labs today. Pending results will make medication adjustments if indicated. ? ?

## 2021-04-28 NOTE — Assessment & Plan Note (Signed)
-  Not candidate for statin therapy with hx of non-alcoholic cirrhosis. Encourage to continue with low fat diet and stay as active as possible. Will repeat lipid panel today. ?

## 2021-04-28 NOTE — Patient Instructions (Signed)

## 2021-04-28 NOTE — Assessment & Plan Note (Signed)
>>  ASSESSMENT AND PLAN FOR BIPOLAR 1 DISORDER (HCC) WRITTEN ON 04/28/2021  9:55 AM BY ABONZA, MARITZA, PA-C  -Stable. -Continue to follow up with Psychiatry. -Continue current medication regimen. -Will continue to monitor.

## 2021-04-28 NOTE — Assessment & Plan Note (Signed)
-  Followed by Gastroenterology. ?-Continue to avoid fatty foods. ?-Will repeat CMP to monitor hepatic function. ?

## 2021-04-28 NOTE — Assessment & Plan Note (Signed)
-  Controlled with diet. A1c today stable at 6.3. ?-Continue ambulatory glucose monitoring. ?-UA microalbumin collected, A:C <30. ?-Will continue to monitor. ?

## 2021-04-29 LAB — LIPID PANEL
Chol/HDL Ratio: 3 ratio (ref 0.0–4.4)
Cholesterol, Total: 181 mg/dL (ref 100–199)
HDL: 60 mg/dL (ref >39)
LDL Chol Calc (NIH): 107 mg/dL — ABNORMAL HIGH (ref 0–99)
Triglycerides: 78 mg/dL (ref 0–149)
VLDL Cholesterol Cal: 14 mg/dL (ref 5–40)

## 2021-04-29 LAB — CBC WITH DIFFERENTIAL/PLATELET
Basophils Absolute: 0 10*3/uL (ref 0.0–0.2)
Basos: 1 %
EOS (ABSOLUTE): 0.1 10*3/uL (ref 0.0–0.4)
Eos: 2 %
Hematocrit: 38.3 % (ref 34.0–46.6)
Hemoglobin: 12.9 g/dL (ref 11.1–15.9)
Immature Grans (Abs): 0 10*3/uL (ref 0.0–0.1)
Immature Granulocytes: 0 %
Lymphocytes Absolute: 1.1 10*3/uL (ref 0.7–3.1)
Lymphs: 34 %
MCH: 29.7 pg (ref 26.6–33.0)
MCHC: 33.7 g/dL (ref 31.5–35.7)
MCV: 88 fL (ref 79–97)
Monocytes Absolute: 0.2 10*3/uL (ref 0.1–0.9)
Monocytes: 7 %
Neutrophils Absolute: 1.8 10*3/uL (ref 1.4–7.0)
Neutrophils: 56 %
Platelets: 133 10*3/uL — ABNORMAL LOW (ref 150–450)
RBC: 4.34 x10E6/uL (ref 3.77–5.28)
RDW: 13.1 % (ref 11.7–15.4)
WBC: 3.3 10*3/uL — ABNORMAL LOW (ref 3.4–10.8)

## 2021-04-29 LAB — COMPREHENSIVE METABOLIC PANEL
ALT: 23 IU/L (ref 0–32)
AST: 24 IU/L (ref 0–40)
Albumin/Globulin Ratio: 1.7 (ref 1.2–2.2)
Albumin: 4.1 g/dL (ref 3.7–4.7)
Alkaline Phosphatase: 65 IU/L (ref 44–121)
BUN/Creatinine Ratio: 10 — ABNORMAL LOW (ref 12–28)
BUN: 15 mg/dL (ref 8–27)
Bilirubin Total: 0.4 mg/dL (ref 0.0–1.2)
CO2: 24 mmol/L (ref 20–29)
Calcium: 11.1 mg/dL — ABNORMAL HIGH (ref 8.7–10.3)
Chloride: 112 mmol/L — ABNORMAL HIGH (ref 96–106)
Creatinine, Ser: 1.46 mg/dL — ABNORMAL HIGH (ref 0.57–1.00)
Globulin, Total: 2.4 g/dL (ref 1.5–4.5)
Glucose: 139 mg/dL — ABNORMAL HIGH (ref 70–99)
Potassium: 4.3 mmol/L (ref 3.5–5.2)
Sodium: 147 mmol/L — ABNORMAL HIGH (ref 134–144)
Total Protein: 6.5 g/dL (ref 6.0–8.5)
eGFR: 38 mL/min/{1.73_m2} — ABNORMAL LOW (ref >59)

## 2021-04-29 LAB — T4, FREE: Free T4: 1.4 ng/dL (ref 0.82–1.77)

## 2021-04-29 LAB — TSH: TSH: 0.478 u[IU]/mL (ref 0.450–4.500)

## 2021-06-14 DIAGNOSIS — F3174 Bipolar disorder, in full remission, most recent episode manic: Secondary | ICD-10-CM | POA: Diagnosis not present

## 2021-06-14 DIAGNOSIS — Z79899 Other long term (current) drug therapy: Secondary | ICD-10-CM | POA: Diagnosis not present

## 2021-06-14 DIAGNOSIS — F5105 Insomnia due to other mental disorder: Secondary | ICD-10-CM | POA: Diagnosis not present

## 2021-07-21 ENCOUNTER — Other Ambulatory Visit: Payer: Self-pay | Admitting: Physician Assistant

## 2021-07-21 DIAGNOSIS — Z1231 Encounter for screening mammogram for malignant neoplasm of breast: Secondary | ICD-10-CM

## 2021-07-22 ENCOUNTER — Encounter: Payer: Self-pay | Admitting: Physician Assistant

## 2021-07-27 ENCOUNTER — Ambulatory Visit
Admission: EM | Admit: 2021-07-27 | Discharge: 2021-07-27 | Disposition: A | Discharge status: Home / Self Care | Payer: Medicare Other | Attending: Family Medicine | Admitting: Family Medicine

## 2021-07-27 DIAGNOSIS — N3090 Cystitis, unspecified without hematuria: Secondary | ICD-10-CM | POA: Insufficient documentation

## 2021-07-27 LAB — POCT URINALYSIS DIP (MANUAL ENTRY)
Bilirubin, UA: NEGATIVE
Glucose, UA: NEGATIVE mg/dL
Ketones, POC UA: NEGATIVE mg/dL
Nitrite, UA: NEGATIVE
Protein Ur, POC: 100 mg/dL — AB
Spec Grav, UA: 1.015 (ref 1.010–1.025)
Urobilinogen, UA: 0.2 E.U./dL
pH, UA: 7 (ref 5.0–8.0)

## 2021-07-27 MED ORDER — CEPHALEXIN 250 MG PO CAPS: 250.0000 mg | ORAL_CAPSULE | Freq: Three times a day (TID) | ORAL | 0 refills | Status: AC

## 2021-07-27 MED ORDER — PHENAZOPYRIDINE HCL 100 MG PO TABS: 100.0000 mg | ORAL_TABLET | Freq: Three times a day (TID) | ORAL | 0 refills | Status: DC | PRN

## 2021-07-27 NOTE — ED Provider Notes (Signed)
[facility]    CSN: [id number] Arrival date & time: 07/27/21  0911      History   Chief Complaint Chief Complaint  Patient presents with   Hematuria    HPI [name] is a 76 y.o. female.    Hematuria   Here for a 4-day history of urinary frequency and dysuria.  She has had some hematuria also that is been intermittent.  She noticed it on June 12 and then again this morning.  She had some chills on June 10 and June 11.  No vomiting or diarrhea.  She did have a COVID-vaccine done on June 10 prior to the chills.  She did do a home COVID test that was negative  Past Medical History:  Diagnosis Date   Bipolar disorder ([place])    Cataract    Phreesia 09/27/2019   Chills without fever    intermittant   Depression    Diabetes mellitus without complication ([place])    Phreesia 09/27/2019   History of colon polyps    2009 TUBULAR ADENOMA   History of concussion    02-05-2012--  fell and hit head (had previous falls prior to this day) dx mild concussion -- per MRI and neurologist note (dr Leonie Man)  right frontal lob w/ shear injuries consistant with fall/trauma   History of Graves' disease    s/p  radiactive iodine therapy 1989   History of primary hyperparathyroidism    W/  HYPERCALCEMIA--  s/p  left inferior parathyroidectomy 2014   Hyperlipidemia    Hypothyroidism, postradioiodine therapy 1989  approx   endocrinologist-  dr Gaylene Brooks [facility]   Increased urinary frequency    Osteoporosis    Thyroid disease    Phreesia 09/27/2019   Type 2 diabetes mellitus ([place])     Patient Active Problem List   Diagnosis Date Noted   Acute upper respiratory infection 02/16/2021   Abnormal weight loss 03/30/2020   Cholelithiasis without obstruction 03/30/2020   Chronic idiopathic constipation 03/30/2020   Cirrhosis, nonalcoholic ([place]) 37/62/8315   Constipation 03/30/2020   Family history of malignant neoplasm of gastrointestinal tract 03/30/2020    Fatty liver 03/30/2020   Gastroesophageal reflux disease 03/30/2020   Gastro-esophageal reflux disease with esophagitis 03/30/2020   Imaging of gastrointestinal tract abnormal 03/30/2020   Personal history of colonic polyps 03/30/2020   Rectal bleeding 03/30/2020   Pre-diabetes 09/10/2018   Muscle spasm 06/17/2018   Acute bilateral low back pain without sciatica 06/17/2018   Disease of thyroid gland 11/25/2017   Diabetes mellitus due to non-steroid drug without complication ([place]) 17/61/6073   Healthcare maintenance 04/23/2017   Type 2 diabetes mellitus ([place]) 04/23/2017   Hyperlipidemia 04/23/2017   Decreased GFR 04/23/2017   Elevated LFTs 04/23/2017   Osteoporosis 08/23/2015   Primary hyperparathyroidism ([place]) 03/27/2012   Abdominal pain    Hyponatremia 02/13/2012   Chronic diastolic CHF (congestive heart failure) ([place]) 02/13/2012   Hypothyroidism 02/11/2012   CVA (cerebral infarction) 02/10/2012   Ataxia 02/10/2012   Bipolar 1 disorder ([place]) 02/10/2012   Hypotension, postural 02/09/2012    Class: Acute   Bipolar 1 disorder, manic, moderate ([place]) 02/05/2012    Past Surgical History:  Procedure Laterality Date   ABDOMINAL HYSTERECTOMY N/A    Phreesia 09/27/2019   CHOLECYSTECTOMY N/A    Phreesia 09/27/2019   CYSTOSCOPY N/A 12/19/2015   Procedure: Rolly Salter UNDER ANESTHESIA;  Surgeon: Kathie Rhodes, MD;  Location: [facility];  Service: Urology;  Laterality: N/A;  PARATHYROIDECTOMY Left 03/27/2012   Procedure: Left Inferior Mininally Invasive PARATHYROIDECTOMY;  Surgeon: Earnstine Regal, MD;  Location: [facility];  Service: General;  Laterality: Left;  Left Inferior Mininally Invasive Parathyroidectomy---  hypercellular adenoma   TRANSOBTURATOR SLING  09/01/2004   transvaginal mesh placement     TUBAL LIGATION  1978   VAGINAL HYSTERECTOMY  1986    WITH OVARIAN PRESERVATION     OB History     Gravida  1   Para  1   Term      Preterm      AB       Living  1      SAB      IAB      Ectopic      Multiple      Live Births               Home Medications    Prior to Admission medications   Medication Sig Start Date End Date Taking? Authorizing Provider  cephALEXin (KEFLEX) 250 MG capsule Take 1 capsule (250 mg total) by mouth 3 (three) times daily for 7 days. 07/27/21 08/03/21 Yes Hanin Decook, Gwenlyn Perking, MD  phenazopyridine (PYRIDIUM) 100 MG tablet Take 1 tablet (100 mg total) by mouth 3 (three) times daily as needed (urinary pain). 07/27/21  Yes Jalien Weakland, Gwenlyn Perking, MD  acetaminophen (TYLENOL) 500 MG tablet Take 500 mg by mouth every 6 (six) hours as needed. For pain    [provider]  Baclofen 5 MG TABS Take 1 tablet by mouth twice daily as needed. 07/08/19   Lorrene Reid, PA-C  calcipotriene-betamethasone (TACLONEX SCALP) external suspension Apply topically daily as needed.     [provider]  clobetasol cream (TEMOVATE) 5.68 % Apply 1 application topically 2 (two) times daily as needed.     [provider]  fluticasone (CUTIVATE) 0.05 % cream Apply topically 2 (two) times daily as needed.     [provider]  fluticasone (FLONASE) 50 MCG/ACT nasal spray SHAKE LIQUID AND USE 1 SPRAY IN EACH NOSTRIL DAILY 09/22/20   Lorrene Reid, PA-C  FREESTYLE LITE test strip Use to check blood sugars every morning fasting and 2 hours after largest meal 03/29/21   Abonza, Maritza, PA-C  ketoconazole (NIZORAL) 2 % cream Apply 1 application topically daily as needed.     [provider]  Lancets (FREESTYLE) lancets Use to check blood sugars every morning fasting and 2 hours after largest meal 03/23/21   Abonza, Maritza, PA-C  levothyroxine (SYNTHROID) 75 MCG tablet Take 1 tablet (75 mcg total) by mouth daily before breakfast. 04/28/21   Abonza, Maritza, PA-C  SEROQUEL 50 MG tablet Take 1 tablet by mouth at bedtime. 02/11/17   [provider]    Family History Family History  Problem Relation  Age of Onset   Cancer Mother        MELANOMA   Diabetes Maternal Aunt    Heart attack Brother    Diabetes Maternal Uncle     Social History Social History   Tobacco Use   Smoking status: Former    Packs/day: 1.00    Years: 30.00    Total pack years: 30.00    Types: Cigarettes    Quit date: 03/18/1997    Years since quitting: 24.3   Smokeless tobacco: Never  Vaping Use   Vaping Use: Never used  Substance Use Topics   Alcohol use: No    Alcohol/week: 0.0 standard drinks of alcohol  Drug use: No     Allergies   Aspirin, Lipitor [atorvastatin calcium], Trimethoprim, Vivelle [estradiol], Zetia [ezetimibe], Zocor [simvastatin], and Sulfa antibiotics   Review of Systems Review of Systems  Genitourinary:  Positive for hematuria.     Physical Exam Triage Vital Signs ED Triage Vitals  Enc Vitals Group     BP 07/27/21 0935 114/66     Pulse Rate 07/27/21 0935 77     Resp 07/27/21 0935 17     Temp 07/27/21 0935 97.9 F (36.6 C)     Temp Source 07/27/21 0935 Oral     SpO2 07/27/21 0935 99 %     Weight --      Height --      Head Circumference --      Peak Flow --      Pain Score 07/27/21 0937 5     Pain Loc --      Pain Edu? --      Excl. in [place]? --    No data found.  Updated Vital Signs BP 114/66 (BP Location: Left Arm)   Pulse 77   Temp 97.9 F (36.6 C) (Oral)   Resp 17   SpO2 99%   Visual Acuity Right Eye Distance:   Left Eye Distance:   Bilateral Distance:    Right Eye Near:   Left Eye Near:    Bilateral Near:     Physical Exam Vitals reviewed.  Constitutional:      General: She is not in acute distress.    Appearance: She is not toxic-appearing.  HENT:     Mouth/Throat:     Mouth: Mucous membranes are moist.     Pharynx: No oropharyngeal exudate or posterior oropharyngeal erythema.  Eyes:     Extraocular Movements: Extraocular movements intact.     Conjunctiva/sclera: Conjunctivae normal.     Pupils: Pupils are equal, round, and reactive  to light.  Cardiovascular:     Rate and Rhythm: Normal rate and regular rhythm.     Heart sounds: No murmur heard. Pulmonary:     Effort: Pulmonary effort is normal. No respiratory distress.     Breath sounds: No wheezing, rhonchi or rales.  Chest:     Chest wall: No tenderness.  Abdominal:     Palpations: Abdomen is soft.     Tenderness: There is no abdominal tenderness.  Musculoskeletal:     Cervical back: Neck supple.  Lymphadenopathy:     Cervical: No cervical adenopathy.  Skin:    Capillary Refill: Capillary refill takes less than 2 seconds.     Coloration: Skin is not jaundiced or pale.  Neurological:     General: No focal deficit present.     Mental Status: She is alert and oriented to person, place, and time.  Psychiatric:        Behavior: Behavior normal.      UC Treatments / Results  Labs (all labs ordered are listed, but only abnormal results are displayed) Labs Reviewed  POCT URINALYSIS DIP (MANUAL ENTRY) - Abnormal; Notable for the following components:      Result Value   Color, UA other (*)    Clarity, UA cloudy (*)    Blood, UA large (*)    Protein Ur, POC =100 (*)    Leukocytes, UA Large (3+) (*)    All other components within normal limits  URINE CULTURE    EKG   Radiology No results found.  Procedures Procedures (including critical care time)  Medications Ordered in UC Medications - No data to display  Initial Impression / Assessment and Plan / UC Course  I have reviewed the triage vital signs and the nursing notes.  Pertinent labs & imaging results that were available during my care of the patient were reviewed by me and considered in my medical decision making (see chart for details).     UA shows large amount of blood and leukocytes.  We will treat for cystitis and culture urine Final Clinical Impressions(s) / UC Diagnoses   Final diagnoses:  Cystitis     Discharge Instructions      Your urinalysis showed some blood and  white blood cells  Take cephalexin 250 mg--1 capsule 3 times daily for 7 days  Take phenazopyridine 100 grams--1 tablet 3 times daily as needed for urinary pain  We are sending a urine culture, and staff will call you if it looks like the antibiotic needs to be changed     ED Prescriptions     Medication Sig Dispense Auth. Provider   cephALEXin (KEFLEX) 250 MG capsule Take 1 capsule (250 mg total) by mouth 3 (three) times daily for 7 days. 21 capsule Barrett Henle, MD   phenazopyridine (PYRIDIUM) 100 MG tablet Take 1 tablet (100 mg total) by mouth 3 (three) times daily as needed (urinary pain). 10 tablet Windy Carina Gwenlyn Perking, MD      PDMP not reviewed this encounter.   Barrett Henle, MD 07/27/21 [phone]

## 2021-07-27 NOTE — Discharge Instructions (Addendum)
Your urinalysis showed some blood and white blood cells  Take cephalexin 250 mg--1 capsule 3 times daily for 7 days  Take phenazopyridine 100 grams--1 tablet 3 times daily as needed for urinary pain  We are sending a urine culture, and staff will call you if it looks like the antibiotic needs to be changed

## 2021-07-27 NOTE — ED Triage Notes (Signed)
Pt presents with blood in urine and burning during urination X 4 days.

## 2021-07-28 LAB — URINE CULTURE: Culture: 10000 — AB

## 2021-09-04 ENCOUNTER — Ambulatory Visit: Payer: Medicare Other

## 2021-09-12 DIAGNOSIS — F3174 Bipolar disorder, in full remission, most recent episode manic: Secondary | ICD-10-CM | POA: Diagnosis not present

## 2021-09-12 DIAGNOSIS — Z79899 Other long term (current) drug therapy: Secondary | ICD-10-CM | POA: Diagnosis not present

## 2021-09-12 DIAGNOSIS — F5105 Insomnia due to other mental disorder: Secondary | ICD-10-CM | POA: Diagnosis not present

## 2021-09-19 ENCOUNTER — Encounter: Payer: Self-pay | Admitting: Physician Assistant

## 2021-09-22 ENCOUNTER — Ambulatory Visit: Payer: Medicare Other

## 2021-09-28 ENCOUNTER — Ambulatory Visit
Admission: RE | Admit: 2021-09-28 | Discharge: 2021-09-28 | Disposition: A | Discharge status: Home / Self Care | Payer: Medicare Other | Source: Ambulatory Visit | Attending: Physician Assistant | Admitting: Physician Assistant

## 2021-09-28 DIAGNOSIS — Z1231 Encounter for screening mammogram for malignant neoplasm of breast: Secondary | ICD-10-CM

## 2021-11-01 ENCOUNTER — Encounter: Payer: Self-pay | Admitting: Physician Assistant

## 2021-11-01 ENCOUNTER — Ambulatory Visit (INDEPENDENT_AMBULATORY_CARE_PROVIDER_SITE_OTHER): Payer: Medicare Other | Admitting: Physician Assistant

## 2021-11-01 VITALS — BP 107/65 | HR 54 | Temp 97.7°F | Ht 62.0 in | Wt 121.0 lb

## 2021-11-01 DIAGNOSIS — F319 Bipolar disorder, unspecified: Secondary | ICD-10-CM | POA: Diagnosis not present

## 2021-11-01 DIAGNOSIS — E039 Hypothyroidism, unspecified: Secondary | ICD-10-CM | POA: Diagnosis not present

## 2021-11-01 DIAGNOSIS — E1122 Type 2 diabetes mellitus with diabetic chronic kidney disease: Secondary | ICD-10-CM | POA: Diagnosis not present

## 2021-11-01 DIAGNOSIS — E785 Hyperlipidemia, unspecified: Secondary | ICD-10-CM | POA: Diagnosis not present

## 2021-11-01 NOTE — Patient Instructions (Signed)

## 2021-11-01 NOTE — Progress Notes (Signed)
Established patient visit   Patient: [name]   DOB: [birth year]   76 y.o. Female  MRN: [id number] Visit Date: 11/01/2021  Chief Complaint  Patient presents with   Follow-up   Subjective    HPI  Patient presents for chronic follow-up visit. Patient has no acute concerns.   Diabetes mellitus: Pt denies increased urination or thirst. Pt managing with diet. No hypoglycemic events. Checking glucose at home. FBS average less than 120. Highest 135. Reports notices when eating seafood her sugars are higher. Denies readings above 200. Continues to stay active with dancing.   Thyroid: Taking medication as directed without issues. No fatigue, palpitations, or unintentional weight changes.  Mood: Reports continues to see her psychiatrist. Taking Seroquel as advised. No mood changes.    Medications: Outpatient Medications Prior to Visit  Medication Sig   acetaminophen (TYLENOL) 500 MG tablet Take 500 mg by mouth every 6 (six) hours as needed. For pain   Baclofen 5 MG TABS Take 1 tablet by mouth twice daily as needed.   calcipotriene-betamethasone (TACLONEX SCALP) external suspension Apply topically daily as needed.    clobetasol cream (TEMOVATE) 4.09 % Apply 1 application topically 2 (two) times daily as needed.    fluticasone (CUTIVATE) 0.05 % cream Apply topically 2 (two) times daily as needed.    fluticasone (FLONASE) 50 MCG/ACT nasal spray SHAKE LIQUID AND USE 1 SPRAY IN EACH NOSTRIL DAILY   FREESTYLE LITE test strip Use to check blood sugars every morning fasting and 2 hours after largest meal   ketoconazole (NIZORAL) 2 % cream Apply 1 application topically daily as needed.    Lancets (FREESTYLE) lancets Use to check blood sugars every morning fasting and 2 hours after largest meal   levothyroxine (SYNTHROID) 75 MCG tablet Take 1 tablet (75 mcg total) by mouth daily before breakfast.   phenazopyridine (PYRIDIUM) 100 MG tablet Take 1 tablet (100 mg total) by mouth 3 (three) times  daily as needed (urinary pain).   SEROQUEL 50 MG tablet Take 1 tablet by mouth at bedtime.   No facility-administered medications prior to visit.    Review of Systems Review of Systems:  A fourteen system review of systems was performed and found to be positive as per HPI.  Last CBC Lab Results  Component Value Date   WBC 3.3 (L) 04/28/2021   HGB 12.9 04/28/2021   HCT 38.3 04/28/2021   MCV 88 04/28/2021   MCH 29.7 04/28/2021   RDW 13.1 04/28/2021   PLT 133 (L) 81/19/1478   Last metabolic panel Lab Results  Component Value Date   GLUCOSE 139 (H) 04/28/2021   NA 147 (H) 04/28/2021   K 4.3 04/28/2021   CL 112 (H) 04/28/2021   CO2 24 04/28/2021   BUN 15 04/28/2021   CREATININE 1.46 (H) 04/28/2021   EGFR 38 (L) 04/28/2021   CALCIUM 11.1 (H) 04/28/2021   PROT 6.5 04/28/2021   ALBUMIN 4.1 04/28/2021   LABGLOB 2.4 04/28/2021   AGRATIO 1.7 04/28/2021   BILITOT 0.4 04/28/2021   ALKPHOS 65 04/28/2021   AST 24 04/28/2021   ALT 23 04/28/2021   Last lipids Lab Results  Component Value Date   CHOL 181 04/28/2021   HDL 60 04/28/2021   LDLCALC 107 (H) 04/28/2021   TRIG 78 04/28/2021   CHOLHDL 3.0 04/28/2021   Last hemoglobin A1c Lab Results  Component Value Date   HGBA1C 6.3 (A) 04/28/2021   Last thyroid functions Lab Results  Component Value Date  TSH 0.478 04/28/2021   T3TOTAL 125 06/14/2017   Last vitamin D Lab Results  Component Value Date   VD25OH 83 04/09/2017      11/01/2021    2:50 PM 04/28/2021    9:22 AM 02/28/2021    1:59 PM 02/16/2021    1:39 PM 12/29/2020   10:10 AM  Depression screen PHQ 2/9  Decreased Interest 0 0 0 0 0  Down, Depressed, Hopeless 0 0 0 0 0  PHQ - 2 Score 0 0 0 0 0  Altered sleeping 0 0 0 0 0  Tired, decreased energy 0 0 0 0 0  Change in appetite 0 0 0 0 0  Feeling bad or failure about yourself  0 0 0 0 0  Trouble concentrating 0 1 0 0 0  Moving slowly or fidgety/restless 0 0 0 0 0  Suicidal thoughts 0 0 0 0 0  PHQ-9 Score  0 1 0 0 0  Difficult doing work/chores Not difficult at all  Not difficult at all  Not difficult at all      11/01/2021    2:51 PM 04/28/2021    9:22 AM 02/28/2021    1:59 PM 02/16/2021    1:39 PM  GAD 7 : Generalized Anxiety Score  Nervous, Anxious, on Edge 0 0 0 0  Control/stop worrying 0 0 0 0  Worry too much - different things 0 0 0 0  Trouble relaxing 0 0 0 1  Restless 0 0 0 0  Easily annoyed or irritable 0 0 0 0  Afraid - awful might happen 1 1 0 0  Total GAD 7 Score 1 1 0 1  Anxiety Difficulty Not difficult at all Not difficult at all Not difficult at all         Objective    BP 107/65   Pulse (!) 54   Temp 97.7 F (36.5 C) (Temporal)   Ht _0  (1.575 m)   Wt 121 lb (54.9 kg)   BMI 22.13 kg/m  BP Readings from Last 3 Encounters:  11/01/21 107/65  07/27/21 114/66  04/28/21 116/64   Wt Readings from Last 3 Encounters:  11/01/21 121 lb (54.9 kg)  04/28/21 120 lb (54.4 kg)  03/07/21 119 lb (54 kg)    Physical Exam  General:  Well Developed, well nourished, in no acute distress Neuro:  Alert and oriented,  extra-ocular muscles intact  HEENT:  Normocephalic, atraumatic, neck supple  Skin:  no gross rash, warm, pink. Cardiac:  RRR, S1 S2 Respiratory: CTA B/L  Vascular:  Ext warm, no cyanosis apprec.; cap RF less 2 sec., pedal pulses 2+ Psych:  No HI/SI, judgement and insight good, Euthymic mood. Full Affect.   No results found for any visits on 11/01/21.  Assessment & Plan      Problem List Items Addressed This Visit       Endocrine   Type 2 diabetes mellitus ([place]) - Primary (Chronic)    -Last A1c 6.3 (at goal<7.0), will collect A1c with lab visit tomorrow. Recommend to continue ambulatory glucose monitoring and follow a diabetic diet.  -Foot exam performed. Diabetic Foot Exam - Simple   Simple Foot Form Diabetic Foot exam was performed with the following findings: Yes 11/01/2021  3:00 PM  Visual Inspection No deformities, no ulcerations, no other skin  breakdown bilaterally: Yes Sensation Testing Intact to touch and monofilament testing bilaterally: Yes Pulse Check Posterior Tibialis and Dorsalis pulse intact bilaterally: Yes Comments  Relevant Orders   CBC w/Diff   Comp Met (CMET)   HgB A1c   Hypothyroidism    -Last TSH wnl -Continue current medication regimen. -Rechecking thyroid labs tomorrow. Pending results will make medication adjustments if indicated.       Relevant Orders   TSH   T4, free     Other   Hyperlipidemia (Chronic)   Relevant Orders   CBC w/Diff   Comp Met (CMET)   Lipid Profile   Bipolar 1 disorder (HCC)    -Stable. Recommend to continue to follow-up with psychiatry and continue current medication regimen.       Return in about 3 months (around 01/31/2022) for [place]; lab visit this week for FBW.        Lorrene Reid, PA-C  [facility] [phone] (phone) [phone] (fax)  [place]

## 2021-11-01 NOTE — Assessment & Plan Note (Signed)
-  Stable. Recommend to continue to follow-up with psychiatry and continue current medication regimen.

## 2021-11-01 NOTE — Assessment & Plan Note (Signed)
-  Last TSH wnl -Continue current medication regimen. -Rechecking thyroid labs tomorrow. Pending results will make medication adjustments if indicated.

## 2021-11-01 NOTE — Assessment & Plan Note (Signed)
>>  ASSESSMENT AND PLAN FOR BIPOLAR 1 DISORDER (HCC) WRITTEN ON 11/01/2021  3:11 PM BY ABONZA, MARITZA, PA-C  -Stable. Recommend to continue to follow-up with psychiatry and continue current medication regimen.

## 2021-11-01 NOTE — Assessment & Plan Note (Addendum)
-  Last A1c 6.3 (at goal<7.0), will collect A1c with lab visit tomorrow. Recommend to continue ambulatory glucose monitoring and follow a diabetic diet.  -Foot exam performed. Diabetic Foot Exam - Simple   Simple Foot Form Diabetic Foot exam was performed with the following findings: Yes 11/01/2021  3:00 PM  Visual Inspection No deformities, no ulcerations, no other skin breakdown bilaterally: Yes Sensation Testing Intact to touch and monofilament testing bilaterally: Yes Pulse Check Posterior Tibialis and Dorsalis pulse intact bilaterally: Yes Comments

## 2021-11-02 ENCOUNTER — Other Ambulatory Visit: Payer: Medicare Other

## 2021-11-02 DIAGNOSIS — E785 Hyperlipidemia, unspecified: Secondary | ICD-10-CM

## 2021-11-02 DIAGNOSIS — E039 Hypothyroidism, unspecified: Secondary | ICD-10-CM | POA: Diagnosis not present

## 2021-11-02 DIAGNOSIS — E1122 Type 2 diabetes mellitus with diabetic chronic kidney disease: Secondary | ICD-10-CM | POA: Diagnosis not present

## 2021-11-03 LAB — CBC WITH DIFFERENTIAL/PLATELET
Basophils Absolute: 0 10*3/uL (ref 0.0–0.2)
Basos: 1 %
EOS (ABSOLUTE): 0.1 10*3/uL (ref 0.0–0.4)
Eos: 3 %
Hematocrit: 37.6 % (ref 34.0–46.6)
Hemoglobin: 12.9 g/dL (ref 11.1–15.9)
Immature Grans (Abs): 0 10*3/uL (ref 0.0–0.1)
Immature Granulocytes: 0 %
Lymphocytes Absolute: 1.3 10*3/uL (ref 0.7–3.1)
Lymphs: 35 %
MCH: 29.9 pg (ref 26.6–33.0)
MCHC: 34.3 g/dL (ref 31.5–35.7)
MCV: 87 fL (ref 79–97)
Monocytes Absolute: 0.3 10*3/uL (ref 0.1–0.9)
Monocytes: 8 %
Neutrophils Absolute: 1.9 10*3/uL (ref 1.4–7.0)
Neutrophils: 53 %
Platelets: 133 10*3/uL — ABNORMAL LOW (ref 150–450)
RBC: 4.31 x10E6/uL (ref 3.77–5.28)
RDW: 12.6 % (ref 11.7–15.4)
WBC: 3.6 10*3/uL (ref 3.4–10.8)

## 2021-11-03 LAB — COMPREHENSIVE METABOLIC PANEL
ALT: 23 IU/L (ref 0–32)
AST: 20 IU/L (ref 0–40)
Albumin/Globulin Ratio: 2 (ref 1.2–2.2)
Albumin: 4.3 g/dL (ref 3.8–4.8)
Alkaline Phosphatase: 68 IU/L (ref 44–121)
BUN/Creatinine Ratio: 17 (ref 12–28)
BUN: 19 mg/dL (ref 8–27)
Bilirubin Total: 0.4 mg/dL (ref 0.0–1.2)
CO2: 22 mmol/L (ref 20–29)
Calcium: 10.3 mg/dL (ref 8.7–10.3)
Chloride: 112 mmol/L — ABNORMAL HIGH (ref 96–106)
Creatinine, Ser: 1.15 mg/dL — ABNORMAL HIGH (ref 0.57–1.00)
Globulin, Total: 2.1 g/dL (ref 1.5–4.5)
Glucose: 126 mg/dL — ABNORMAL HIGH (ref 70–99)
Potassium: 4.2 mmol/L (ref 3.5–5.2)
Sodium: 147 mmol/L — ABNORMAL HIGH (ref 134–144)
Total Protein: 6.4 g/dL (ref 6.0–8.5)
eGFR: 50 mL/min/{1.73_m2} — ABNORMAL LOW (ref >59)

## 2021-11-03 LAB — HEMOGLOBIN A1C
Est. average glucose Bld gHb Est-mCnc: 140 mg/dL
Hgb A1c MFr Bld: 6.5 % — ABNORMAL HIGH (ref 4.8–5.6)

## 2021-11-03 LAB — LIPID PANEL
Chol/HDL Ratio: 2.9 ratio (ref 0.0–4.4)
Cholesterol, Total: 184 mg/dL (ref 100–199)
HDL: 63 mg/dL (ref >39)
LDL Chol Calc (NIH): 106 mg/dL — ABNORMAL HIGH (ref 0–99)
Triglycerides: 84 mg/dL (ref 0–149)
VLDL Cholesterol Cal: 15 mg/dL (ref 5–40)

## 2021-11-03 LAB — T4, FREE: Free T4: 1.09 ng/dL (ref 0.82–1.77)

## 2021-11-03 LAB — TSH: TSH: 0.53 u[IU]/mL (ref 0.450–4.500)

## 2021-11-07 ENCOUNTER — Encounter: Payer: Self-pay | Admitting: Physician Assistant

## 2021-12-27 DIAGNOSIS — F5105 Insomnia due to other mental disorder: Secondary | ICD-10-CM | POA: Diagnosis not present

## 2021-12-27 DIAGNOSIS — F3174 Bipolar disorder, in full remission, most recent episode manic: Secondary | ICD-10-CM | POA: Diagnosis not present

## 2022-01-06 ENCOUNTER — Encounter: Payer: Self-pay | Admitting: Nurse Practitioner

## 2022-01-06 DIAGNOSIS — E039 Hypothyroidism, unspecified: Secondary | ICD-10-CM

## 2022-01-08 MED ORDER — LEVOTHYROXINE SODIUM 75 MCG PO TABS: 75.0000 ug | ORAL_TABLET | Freq: Every day | ORAL | 0 refills | Status: DC

## 2022-02-02 ENCOUNTER — Ambulatory Visit (INDEPENDENT_AMBULATORY_CARE_PROVIDER_SITE_OTHER): Payer: Medicare Other | Admitting: Nurse Practitioner

## 2022-02-02 ENCOUNTER — Encounter: Payer: Self-pay | Admitting: Nurse Practitioner

## 2022-02-02 VITALS — BP 106/63 | HR 60 | Ht 62.0 in | Wt 122.4 lb

## 2022-02-02 DIAGNOSIS — E039 Hypothyroidism, unspecified: Secondary | ICD-10-CM | POA: Diagnosis not present

## 2022-02-02 DIAGNOSIS — E119 Type 2 diabetes mellitus without complications: Secondary | ICD-10-CM | POA: Diagnosis not present

## 2022-02-02 DIAGNOSIS — Z0000 Encounter for general adult medical examination without abnormal findings: Secondary | ICD-10-CM

## 2022-02-02 DIAGNOSIS — E785 Hyperlipidemia, unspecified: Secondary | ICD-10-CM | POA: Diagnosis not present

## 2022-02-02 DIAGNOSIS — E1122 Type 2 diabetes mellitus with diabetic chronic kidney disease: Secondary | ICD-10-CM

## 2022-02-02 LAB — POCT GLYCOSYLATED HEMOGLOBIN (HGB A1C): HbA1c POC (<> result, manual entry): 6.6 % (ref 4.0–5.6)

## 2022-02-02 NOTE — Progress Notes (Signed)
Subjective:   [name] is a 76 y.o. female who presents for Medicare Annual (Subsequent) preventive examination. -type 2 diabetes  -diet controlled -HgbA1c today is 6.6 -well managed hypothyroid.  -She denies chest pain, chest pressure, or shortness of breath. She denies headaches or visual disturbances. She denies abdominal pain, nausea, vomiting, or changes in bowel or bladder habits.     Review of Systems    see HPI       Objective:    Today's Vitals   02/02/22 0914  BP: 106/63  Pulse: 60  SpO2: 99%  Weight: 122 lb 6.4 oz (55.5 kg)  Height: _0  (1.575 m)   Body mass index is 22.39 kg/m.    Row Labels 04/23/2017   11:01 AM 12/19/2015    8:15 AM 03/24/2012    9:21 AM 02/10/2012   11:06 PM 01/29/2012    5:46 PM 01/29/2012    2:30 PM  Advanced Directives   Section Header. No data exists in this row.        Does Patient Have a Medical Advance Directive?   Yes Yes Patient does not have advance directive;Patient would not like information Patient has advance directive, copy not in chart    Type of Advance Directive   Living will Living will;Healthcare Power of Attorney  Living will    Copy of [place] in Chart?      Copy requested from family    Pre-existing out of facility DNR order (yellow form or pink MOST form)      No       Information is confidential and restricted. Go to Review Flowsheets to unlock data.    Current Medications (verified) Outpatient Encounter Medications as of 02/02/2022  Medication Sig   acetaminophen (TYLENOL) 500 MG tablet Take 500 mg by mouth every 6 (six) hours as needed. For pain   Baclofen 5 MG TABS Take 1 tablet by mouth twice daily as needed.   calcipotriene-betamethasone (TACLONEX SCALP) external suspension Apply topically daily as needed.    clobetasol cream (TEMOVATE) 6.16 % Apply 1 application topically 2 (two) times daily as needed.    fluticasone (CUTIVATE) 0.05 % cream Apply topically 2 (two) times  daily as needed.    fluticasone (FLONASE) 50 MCG/ACT nasal spray SHAKE LIQUID AND USE 1 SPRAY IN EACH NOSTRIL DAILY   FREESTYLE LITE test strip Use to check blood sugars every morning fasting and 2 hours after largest meal   ketoconazole (NIZORAL) 2 % cream Apply 1 application topically daily as needed.    Lancets (FREESTYLE) lancets Use to check blood sugars every morning fasting and 2 hours after largest meal   levothyroxine (SYNTHROID) 75 MCG tablet Take 1 tablet (75 mcg total) by mouth daily before breakfast.   phenazopyridine (PYRIDIUM) 100 MG tablet Take 1 tablet (100 mg total) by mouth 3 (three) times daily as needed (urinary pain).   SEROQUEL 50 MG tablet Take 1 tablet by mouth at bedtime.   No facility-administered encounter medications on file as of 02/02/2022.    Allergies (verified) Aspirin, Lipitor [atorvastatin calcium], Trimethoprim, Vivelle [estradiol], Zetia [ezetimibe], Zocor [simvastatin], and Sulfa antibiotics   History: Past Medical History:  Diagnosis Date   Bipolar disorder ([place])    Cataract    Phreesia 09/27/2019   Chills without fever    intermittant   Depression    Diabetes mellitus without complication ([place])    Phreesia 09/27/2019   History of colon polyps    2009  TUBULAR ADENOMA   History of concussion    02-05-2012--  fell and hit head (had previous falls prior to this day) dx mild concussion -- per MRI and neurologist note (dr Leonie Man)  right frontal lob w/ shear injuries consistant with fall/trauma   History of Graves' disease    s/p  radiactive iodine therapy 1989   History of primary hyperparathyroidism    W/  HYPERCALCEMIA--  s/p  left inferior parathyroidectomy 2014   Hyperlipidemia    Hypothyroidism, postradioiodine therapy 1989  approx   endocrinologist-  dr Gaylene [facility]   Increased urinary frequency    Osteoporosis    Thyroid disease    Phreesia 09/27/2019   Type 2 diabetes mellitus [facility])    Past Surgical History:   Procedure Laterality Date   ABDOMINAL HYSTERECTOMY N/A    Phreesia 09/27/2019   CHOLECYSTECTOMY N/A    Phreesia 09/27/2019   CYSTOSCOPY N/A 12/19/2015   Procedure: Rolly Salter UNDER ANESTHESIA;  Surgeon: Kathie Rhodes, MD;  Location: [place];  Service: Urology;  Laterality: N/A;   PARATHYROIDECTOMY Left 03/27/2012   Procedure: Left Inferior Mininally Invasive PARATHYROIDECTOMY;  Surgeon: Earnstine Regal, MD;  Location: [facility];  Service: General;  Laterality: Left;  Left Inferior Mininally Invasive Parathyroidectomy---  hypercellular adenoma   TRANSOBTURATOR SLING  09/01/2004   transvaginal mesh placement     TUBAL LIGATION  1978   VAGINAL HYSTERECTOMY  1986    WITH OVARIAN PRESERVATION    Family History  Problem Relation Age of Onset   Cancer Mother        MELANOMA   Diabetes Maternal Aunt    Heart attack Brother    Diabetes Maternal Uncle    Social History   Socioeconomic History   Marital status: Widowed    Spouse name: Not on file   Number of children: Not on file   Years of education: Not on file   Highest education level: Not on file  Occupational History   Not on file  Tobacco Use   Smoking status: Former    Packs/day: 1.00    Years: 30.00    Total pack years: 30.00    Types: Cigarettes    Quit date: 03/18/1997    Years since quitting: 24.9   Smokeless tobacco: Never  Vaping Use   Vaping Use: Never used  Substance and Sexual Activity   Alcohol use: No    Alcohol/week: 0.0 standard drinks of alcohol   Drug use: No   Sexual activity: Not Currently    Birth control/protection: Surgical  Other Topics Concern   Not on file  Social History Narrative   Not on file   Social Determinants of Health   Financial Resource Strain: Unknown (02/02/2022)   Overall Financial Resource Strain (CARDIA)    Difficulty of Paying Living Expenses: Patient refused  Food Insecurity: No Food Insecurity (02/02/2022)   Hunger Vital Sign    Worried About Running  Out of Food in the Last Year: Never true    Ran Out of Food in the Last Year: Never true  Transportation Needs: No Transportation Needs (02/02/2022)   PRAPARE - Hydrologist (Medical): No    Lack of Transportation (Non-Medical): No  Physical Activity: Insufficiently Active (02/02/2022)   Exercise Vital Sign    Days of Exercise per Week: 2 days    Minutes of Exercise per Session: 50 min  Stress: No Stress Concern Present (02/02/2022)   [employer] of  Occupational Health - Occupational Stress Questionnaire    Feeling of Stress : Only a little  Social Connections: Moderately Isolated (02/02/2022)   Social Connection and Isolation Panel [NHANES]    Frequency of Communication with Friends and Family: Once a week    Frequency of Social Gatherings with Friends and Family: More than three times a week    Attends Religious Services: More than 4 times per year    Active Member of [employer] or Organizations: No    Attends Archivist Meetings: Never    Marital Status: Widowed    Tobacco Counseling Counseling given: Not Answered   Clinical Intake:  Pre-visit preparation completed: Yes  Pain : No/denies pain     BMI - recorded: 22.39 Nutritional Status: BMI of 19-24  Normal Diabetes: Yes CBG done?: No (HgbA1c done today) Did pt. bring in CBG monitor from home?: No  How often do you need to have someone help you when you read instructions, pamphlets, or other written materials from your doctor or pharmacy?: 1 - Never  Diabetic?YES  Interpreter Needed?: No      Activities of Daily Living   Row Labels 02/02/2022    9:22 AM 11/01/2021    3:00 PM  In your present state of health, do you have any difficulty performing the following activities:   Section Header. No data exists in this row.    Hearing?   0 0  Vision?   0 0  Difficulty concentrating or making decisions?   0 0  Walking or climbing stairs?   0 0  Dressing or bathing?   0 0   Doing errands, shopping?   0 0    Patient Care Team: Lorrene Reid, PA-C as PCP - General (Physician Assistant) Juanita Craver, MD as Consulting Physician (Gastroenterology) Anda Kraft, MD as Consulting Physician (Endocrinology) Allyn Kenner, MD (Dermatology) Terrance Mass, MD (Inactive) (Gynecology) Kathie Rhodes, MD (Inactive) as Consulting Physician (Urology) Katy Apo, MD as Consulting Physician (Ophthalmology) Chauncey Mann, MD as Referring Physician (Psychiatry)  Indicate any recent Medical Services you may have received from other than [facility] providers in the past year (date may be approximate).     Assessment:   1. Encounter for Medicare annual wellness exam Annual medicare wellness visit today.   2. Type 2 diabetes mellitus without complication, without long-term current use of insulin (HCC) HgbA1c 6.8 today. Continue to control with diet and lifestyle modification.  - POCT glycosylated hemoglobin (Hb A1C)  3. Hyperlipidemia, unspecified hyperlipidemia type Controlled without medication at this time. Will monitor.   4. Hypothyroidism, unspecified type Stable thyroid. Will monitor.    Hearing/Vision screen No results found.   Depression Screen   Row Labels 02/02/2022    9:21 AM 11/01/2021    2:50 PM 04/28/2021    9:22 AM 02/28/2021    1:59 PM 02/16/2021    1:39 PM 12/29/2020   10:10 AM 10/05/2020    1:15 PM  PHQ 2/9 Scores   Section Header. No data exists in this row.         PHQ - 2 Score   0 0 0 0 0 0 0  PHQ- 9 Score   0 0 1 0 0 0 0    Fall Risk   Row Labels 11/01/2021    2:50 PM 04/28/2021    9:22 AM 02/28/2021    1:59 PM 02/16/2021    1:39 PM 12/29/2020   10:08 AM  Fall Risk    Section Header.  No data exists in this row.       Falls in the past year?   0 0 0 0 0  Number falls in past yr:   0 0 0 0 0  Injury with Fall?   0 0 0 0 0  Risk for fall due to :   No Fall Risks No Fall Risks No Fall Risks  No Fall Risks  Follow up   Falls evaluation  completed Falls evaluation completed Falls evaluation completed Falls evaluation completed Falls evaluation completed    [place]:  Any stairs in or around the home? No  If so, are there any without handrails? No  Home free of loose throw rugs in walkways, pet beds, electrical cords, etc? Yes  Adequate lighting in your home to reduce risk of falls? Yes   ASSISTIVE DEVICES UTILIZED TO PREVENT FALLS:  Life alert? No  Use of a cane, walker or w/c? No  Grab bars in the bathroom? Yes  Shower chair or bench in shower? No  Elevated toilet seat or a handicapped toilet? No   TIMED UP AND GO:  Was the test performed? Yes .  Length of time to ambulate 10 feet: 10 sec.   Gait steady and fast without use of assistive device  Cognitive Function:       Row Labels 02/02/2022    9:22 AM 09/28/2020    9:28 AM 09/29/2019    2:21 PM 03/11/2018    8:05 AM  6CIT Screen   Section Header. No data exists in this row.      What Year?   0 points 0 points 0 points 0 points  What month?   0 points 0 points 0 points 0 points  What time?   0 points 0 points 0 points 0 points  Count back from 20   0 points 0 points 0 points 0 points  Months in reverse   0 points 0 points 0 points 0 points  Repeat phrase   0 points 0 points 0 points 2 points  Total Score   0 points 0 points 0 points 2 points    Immunizations Immunization History  Administered Date(s) Administered   Fluad Quad(high Dose 65+) 10/01/2018   Influenza, High Dose Seasonal PF 11/08/2017, 11/13/2019, 09/19/2021   Influenza,inj,Quad PF,6+ Mos 11/06/2021   Influenza-Unspecified 11/08/2017, 10/01/2018, 10/01/2020   Moderna Sars-Covid-2 Vaccination 03/27/2019, 04/24/2019, 12/19/2019, 07/21/2021   Pfizer Covid-19 Vaccine Bivalent Booster 78yr & up 11/06/2021   Pneumococcal Conjugate-13 04/01/2014   Pneumococcal Polysaccharide-23 04/23/2017   Tdap 01/14/2012, 02/09/2012, 02/28/2021   Zoster Recombinat  (Shingrix) 10/18/2016, 12/18/2016   Zoster, Live 10/13/2012    TDAP status: Up to date  Flu Vaccine status: Due, Education has been provided regarding the importance of this vaccine. Advised may receive this vaccine at local pharmacy or Health Dept. Aware to provide a copy of the vaccination record if obtained from local pharmacy or Health Dept. Verbalized acceptance and understanding.  Pneumococcal vaccine status: Up to date  Covid-19 vaccine status: Completed vaccines  Qualifies for Shingles Vaccine? Yes   Zostavax completed Yes   Shingrix Completed?: Yes  Screening Tests Health Maintenance  Topic Date Due   COVID-19 Vaccine (6 - 2023-24 season) 01/01/2022   Diabetic kidney evaluation - Urine ACR  04/29/2022   HEMOGLOBIN A1C  08/04/2022   FOOT EXAM  11/02/2022   Diabetic kidney evaluation - eGFR measurement  11/03/2022   Medicare Annual Wellness (AWV)  02/03/2023   OPHTHALMOLOGY EXAM  02/22/2023   MAMMOGRAM  09/29/2023   COLONOSCOPY (Pts 45-35yr Insurance coverage will need to be confirmed)  11/07/2027   DTaP/Tdap/Td (4 - Td or Tdap) 03/01/2031   Pneumonia Vaccine 74 Years old  Completed   INFLUENZA VACCINE  Completed   DEXA SCAN  Completed   Hepatitis C Screening  Completed   Zoster Vaccines- Shingrix  Completed   HPV VACCINES  Aged Out    Health Maintenance  Health Maintenance Due  Topic Date Due   COVID-19 Vaccine (6 - 2023-24 season) 01/01/2022    Colorectal cancer screening: Type of screening: Colonoscopy. Completed 2019. Repeat every 10 years  Mammogram status: Completed 2023. Repeat every year  Bone Density status: Completed 2023. Results reflect: Bone density results: NORMAL. Repeat every 0 years.  Lung Cancer Screening: (Low Dose CT Chest recommended if Age 74-80years, 30 pack-year currently smoking OR have quit w/in 15years.) does not qualify.   Lung Cancer Screening Referral: n/a  Additional Screening:  Hepatitis C Screening: does not qualify;  Completed 2020  Vision Screening: Recommended annual ophthalmology exams for early detection of glaucoma and other disorders of the eye. Is the patient up to date with their annual eye exam?  Yes  Who is the provider or what is the name of the office in which the patient attends annual eye exams? Dr. LPrudencio BurlyIf pt is not established with a provider, would they like to be referred to a provider to establish care? No .   Dental Screening: Recommended annual dental exams for proper oral hygiene  Community Resource Referral / Chronic Care Management: CRR required this visit?  No   CCM required this visit?  No      Plan:     I have personally reviewed and noted the following in the patient's chart:   Medical and social history Use of alcohol, tobacco or illicit drugs  Current medications and supplements including opioid prescriptions. Patient is not currently taking opioid prescriptions. Functional ability and status Nutritional status Physical activity Advanced directives List of other physicians Hospitalizations, surgeries, and ER visits in previous 12 months Vitals Screenings to include cognitive, depression, and falls Referrals and appointments  In addition, I have reviewed and discussed with patient certain preventive protocols, quality metrics, and best practice recommendations. A written personalized care plan for preventive services as well as general preventive health recommendations were provided to patient.     HRonnell Freshwater NP   02/25/2022   Nurse Notes: fafe to face 25 min

## 2022-02-21 ENCOUNTER — Encounter: Payer: Self-pay | Admitting: Nurse Practitioner

## 2022-02-21 DIAGNOSIS — Z961 Presence of intraocular lens: Secondary | ICD-10-CM | POA: Diagnosis not present

## 2022-02-21 DIAGNOSIS — H26491 Other secondary cataract, right eye: Secondary | ICD-10-CM | POA: Diagnosis not present

## 2022-02-21 DIAGNOSIS — H52203 Unspecified astigmatism, bilateral: Secondary | ICD-10-CM | POA: Diagnosis not present

## 2022-02-21 DIAGNOSIS — E119 Type 2 diabetes mellitus without complications: Secondary | ICD-10-CM | POA: Diagnosis not present

## 2022-02-21 LAB — HM DIABETES EYE EXAM

## 2022-02-22 DIAGNOSIS — B0089 Other herpesviral infection: Secondary | ICD-10-CM | POA: Diagnosis not present

## 2022-03-28 ENCOUNTER — Other Ambulatory Visit: Payer: Self-pay | Admitting: Nurse Practitioner

## 2022-03-28 DIAGNOSIS — E1122 Type 2 diabetes mellitus with diabetic chronic kidney disease: Secondary | ICD-10-CM

## 2022-03-28 MED ORDER — FREESTYLE LITE TEST VI STRP: ORAL_STRIP | 3 refills | Status: DC

## 2022-04-07 ENCOUNTER — Encounter: Payer: Self-pay | Admitting: Nurse Practitioner

## 2022-04-10 ENCOUNTER — Other Ambulatory Visit: Payer: Self-pay

## 2022-04-10 DIAGNOSIS — F3174 Bipolar disorder, in full remission, most recent episode manic: Secondary | ICD-10-CM | POA: Diagnosis not present

## 2022-04-10 DIAGNOSIS — F5105 Insomnia due to other mental disorder: Secondary | ICD-10-CM | POA: Diagnosis not present

## 2022-04-10 DIAGNOSIS — E039 Hypothyroidism, unspecified: Secondary | ICD-10-CM

## 2022-04-10 MED ORDER — LEVOTHYROXINE SODIUM 75 MCG PO TABS: 75.0000 ug | ORAL_TABLET | Freq: Every day | ORAL | 0 refills | Status: DC

## 2022-05-15 ENCOUNTER — Encounter: Payer: Self-pay | Admitting: Nurse Practitioner

## 2022-05-15 ENCOUNTER — Ambulatory Visit (INDEPENDENT_AMBULATORY_CARE_PROVIDER_SITE_OTHER): Payer: Medicare Other | Admitting: Nurse Practitioner

## 2022-05-15 VITALS — BP 103/56 | HR 64 | Ht 62.0 in | Wt 120.0 lb

## 2022-05-15 DIAGNOSIS — R319 Hematuria, unspecified: Secondary | ICD-10-CM

## 2022-05-15 DIAGNOSIS — N390 Urinary tract infection, site not specified: Secondary | ICD-10-CM | POA: Diagnosis not present

## 2022-05-15 DIAGNOSIS — E1122 Type 2 diabetes mellitus with diabetic chronic kidney disease: Secondary | ICD-10-CM | POA: Diagnosis not present

## 2022-05-15 DIAGNOSIS — R300 Dysuria: Secondary | ICD-10-CM | POA: Insufficient documentation

## 2022-05-15 LAB — POCT URINALYSIS DIP (CLINITEK)
Bilirubin, UA: NEGATIVE
Glucose, UA: NEGATIVE mg/dL
Ketones, POC UA: NEGATIVE mg/dL
Nitrite, UA: POSITIVE — AB
POC PROTEIN,UA: NEGATIVE
Spec Grav, UA: 1.015 (ref 1.010–1.025)
Urobilinogen, UA: 0.2 E.U./dL
pH, UA: 6.5 (ref 5.0–8.0)

## 2022-05-15 MED ORDER — FREESTYLE LITE TEST VI STRP
ORAL_STRIP | 3 refills | Status: DC
Start: 2022-05-15 — End: 2023-02-25

## 2022-05-15 MED ORDER — NITROFURANTOIN MONOHYD MACRO 100 MG PO CAPS: 100.0000 mg | ORAL_CAPSULE | Freq: Two times a day (BID) | ORAL | 0 refills | Status: DC

## 2022-05-15 NOTE — Progress Notes (Signed)
Established patient visit   Patient: [name]   DOB: 1945-11-26   77 y.o. Female  MRN: [id number] Visit Date: 05/15/2022  Chief Complaint  Patient presents with   Urinary Tract Infection   Subjective    Urinary Tract Infection  This is a new problem. The current episode started in the past 7 days. The problem occurs intermittently (sometimes worse than others. happening more than others). The problem has been gradually worsening. The quality of the pain is described as burning. The pain is at a severity of 3/10. There has been no fever. There is No history of pyelonephritis. Associated symptoms include frequency and urgency. Pertinent negatives include no chills, discharge, flank pain, hematuria, hesitancy, nausea, possible pregnancy, sweats or vomiting. She has tried acetaminophen for the symptoms. The treatment provided mild relief.      Medications: Outpatient Medications Prior to Visit  Medication Sig   acetaminophen (TYLENOL) 500 MG tablet Take 500 mg by mouth every 6 (six) hours as needed. For pain   Baclofen 5 MG TABS Take 1 tablet by mouth twice daily as needed.   calcipotriene-betamethasone (TACLONEX SCALP) external suspension Apply topically daily as needed.    clobetasol cream (TEMOVATE) [id number] % Apply 1 application topically 2 (two) times daily as needed.    fluticasone (CUTIVATE) 0.05 % cream Apply topically 2 (two) times daily as needed.    fluticasone (FLONASE) 50 MCG/ACT nasal spray SHAKE LIQUID AND USE 1 SPRAY IN EACH NOSTRIL DAILY   ketoconazole (NIZORAL) 2 % cream Apply 1 application topically daily as needed.    Lancets (FREESTYLE) lancets Use to check blood sugars every morning fasting and 2 hours after largest meal   levothyroxine (SYNTHROID) 75 MCG tablet Take 1 tablet (75 mcg total) by mouth daily before breakfast.   phenazopyridine (PYRIDIUM) 100 MG tablet Take 1 tablet (100 mg total) by mouth 3 (three) times daily as needed (urinary pain).   SEROQUEL 50 MG  tablet Take 1 tablet by mouth at bedtime.   [DISCONTINUED] FREESTYLE LITE test strip Use to check blood sugars every morning fasting and 2 hours after largest meal   No facility-administered medications prior to visit.    Review of Systems  Constitutional:  Negative for chills.  Gastrointestinal:  Negative for nausea and vomiting.  Genitourinary:  Positive for frequency and urgency. Negative for flank pain, hematuria and hesitancy.     Objective     Today's Vitals   05/15/22 0949  BP: (Abnormal) 103/56  Pulse: 64  SpO2: 97%  Weight: 120 lb (54.4 kg)  Height: 5\' 2"  (1.575 m)   Body mass index is 21.95 kg/m.   Physical Exam Vitals and nursing note reviewed.  Constitutional:      Appearance: Normal appearance.  HENT:     Head: Normocephalic and atraumatic.     Nose: Nose normal.  Pulmonary:     Effort: No respiratory distress.  Genitourinary:    Comments: Urine sample positive for nitrites, small WBC, and trace intact blood  Neurological:     Mental Status: She is alert.     Results for orders placed or performed in visit on 05/15/22  POCT URINALYSIS DIP (CLINITEK)  Result Value Ref Range   Color, UA yellow yellow   Clarity, UA clear clear   Glucose, UA negative negative mg/dL   Bilirubin, UA negative negative   Ketones, POC UA negative negative mg/dL   Spec Grav, UA 1.015 1.010 - 1.025   Blood, UA trace-intact (A) negative  pH, UA 6.5 5.0 - 8.0   POC PROTEIN,UA negative negative, trace   Urobilinogen, UA 0.2 0.2 or 1.0 E.U./dL   Nitrite, UA Positive (A) Negative   Leukocytes, UA Small (1+) (A) Negative    Assessment & Plan     Urinary tract infection with hematuria, site unspecified Assessment & Plan: Start macrobid 100 mg twice daily. Send urine for culture and sensitivity and adjust antibiotics as indicated.   Orders: -     Nitrofurantoin Monohyd Macro; Take 1 capsule (100 mg total) by mouth 2 (two) times daily.  Dispense: 14 capsule; Refill:  0  Dysuria Assessment & Plan: Recommend treatment with AZO as needed and as indicated to reduce bladder pain and spasms. Increase water intake.   Orders: -     POCT URINALYSIS DIP (CLINITEK) -     Urine Culture; Future  Type 2 diabetes mellitus with diabetic chronic kidney disease, unspecified CKD stage, unspecified whether long term insulin use Assessment & Plan: Continue to monitor blood glucose routinely. Prescription for strips was sent to mail pharmacy.   Orders: -     FreeStyle Lite Test; Use to check blood sugars every morning fasting and 2 hours after largest meal  Dispense: 200 each; Refill: 3     Return for prn worsening or persistent symptoms.        Ronnell Freshwater, NP  [facility] at [facility] [phone] (phone) [phone] (fax)  Ali Molina

## 2022-05-15 NOTE — Assessment & Plan Note (Signed)
Continue to monitor blood glucose routinely. Prescription for strips was sent to mail pharmacy.

## 2022-05-15 NOTE — Assessment & Plan Note (Signed)
Recommend treatment with AZO as needed and as indicated to reduce bladder pain and spasms. Increase water intake.

## 2022-05-15 NOTE — Assessment & Plan Note (Signed)
Start macrobid 100 mg twice daily. Send urine for culture and sensitivity and adjust antibiotics as indicated.

## 2022-05-19 LAB — URINE CULTURE

## 2022-05-19 LAB — SPECIMEN STATUS REPORT

## 2022-05-21 NOTE — Progress Notes (Signed)
Macrobid prescribed at time of visit. Bacteria susceptible to this antibiotic.

## 2022-06-05 ENCOUNTER — Ambulatory Visit (INDEPENDENT_AMBULATORY_CARE_PROVIDER_SITE_OTHER): Payer: Medicare Other | Admitting: Nurse Practitioner

## 2022-06-05 ENCOUNTER — Encounter: Payer: Self-pay | Admitting: Nurse Practitioner

## 2022-06-05 VITALS — BP 104/67 | HR 63 | Ht 62.0 in | Wt 118.1 lb

## 2022-06-05 DIAGNOSIS — R319 Hematuria, unspecified: Secondary | ICD-10-CM

## 2022-06-05 DIAGNOSIS — E1122 Type 2 diabetes mellitus with diabetic chronic kidney disease: Secondary | ICD-10-CM

## 2022-06-05 DIAGNOSIS — E039 Hypothyroidism, unspecified: Secondary | ICD-10-CM

## 2022-06-05 DIAGNOSIS — N390 Urinary tract infection, site not specified: Secondary | ICD-10-CM

## 2022-06-05 LAB — POCT UA - MICROALBUMIN
Creatinine, POC: 50 mg/dL
Microalbumin Ur, POC: 10 mg/L

## 2022-06-05 LAB — POCT URINALYSIS DIP (CLINITEK)
Bilirubin, UA: NEGATIVE
Blood, UA: NEGATIVE
Glucose, UA: NEGATIVE mg/dL
Ketones, POC UA: NEGATIVE mg/dL
Nitrite, UA: NEGATIVE
POC PROTEIN,UA: NEGATIVE
Spec Grav, UA: 1.015 (ref 1.010–1.025)
Urobilinogen, UA: 0.2 E.U./dL
pH, UA: 6 (ref 5.0–8.0)

## 2022-06-05 LAB — POCT GLYCOSYLATED HEMOGLOBIN (HGB A1C): HbA1c POC (<> result, manual entry): 6.3 % (ref 4.0–5.6)

## 2022-06-05 MED ORDER — CEPHALEXIN 500 MG PO CAPS: 500.0000 mg | ORAL_CAPSULE | Freq: Three times a day (TID) | ORAL | 0 refills | Status: DC

## 2022-06-05 MED ORDER — LEVOTHYROXINE SODIUM 75 MCG PO TABS
75.0000 ug | ORAL_TABLET | Freq: Every day | ORAL | 0 refills | Status: DC
Start: 2022-06-05 — End: 2022-08-29

## 2022-06-05 NOTE — Progress Notes (Signed)
Established patient visit   Patient: [name]   DOB: 27-Aug-1945   77 y.o. Female  MRN: [id number] Visit Date: 06/05/2022   Chief Complaint  Patient presents with   Medical Management of Chronic Issues   Subjective    HPI  Follow up  -DM2 -HgbA1c today is 6.3  --most recent HgbA1c 6.6  -due for urine microalbumin - mildly abnormal results  -recently treated for UTI with macrobid. Effective antibiotic for specific bacterial  -continues to have some intermittent dysuria since being treated for UTI. This has improved but not resolved.  Hypothyroid  -due to have thyroid panel checked today  She denies chest pain, chest pressure, or shortness of breath. she denies headaches or visual disturbances. She denies abdominal pain, nausea, vomiting, or changes in bowel or bladder habits.      Medications: Outpatient Medications Prior to Visit  Medication Sig   acetaminophen (TYLENOL) 500 MG tablet Take 500 mg by mouth every 6 (six) hours as needed. For pain   Baclofen 5 MG TABS Take 1 tablet by mouth twice daily as needed.   calcipotriene-betamethasone (TACLONEX SCALP) external suspension Apply topically daily as needed.    clobetasol cream (TEMOVATE) 0.05 % Apply 1 application topically 2 (two) times daily as needed.    fluticasone (CUTIVATE) 0.05 % cream Apply topically 2 (two) times daily as needed.    fluticasone (FLONASE) 50 MCG/ACT nasal spray SHAKE LIQUID AND USE 1 SPRAY IN EACH NOSTRIL DAILY   FREESTYLE LITE test strip Use to check blood sugars every morning fasting and 2 hours after largest meal   ketoconazole (NIZORAL) 2 % cream Apply 1 application topically daily as needed.    Lancets (FREESTYLE) lancets Use to check blood sugars every morning fasting and 2 hours after largest meal   SEROQUEL 50 MG tablet Take 1 tablet by mouth at bedtime.   [DISCONTINUED] levothyroxine (SYNTHROID) 75 MCG tablet Take 1 tablet (75 mcg total) by mouth daily before breakfast.   [DISCONTINUED]  nitrofurantoin, macrocrystal-monohydrate, (MACROBID) 100 MG capsule Take 1 capsule (100 mg total) by mouth 2 (two) times daily.   [DISCONTINUED] phenazopyridine (PYRIDIUM) 100 MG tablet Take 1 tablet (100 mg total) by mouth 3 (three) times daily as needed (urinary pain).   No facility-administered medications prior to visit.    Review of Systems See HPI      Objective     Today's Vitals   06/05/22 0829  BP: 104/67  Pulse: 63  SpO2: 99%  Weight: 118 lb 1.9 oz (53.6 kg)  Height: 5\' 2"  (1.575 m)   Body mass index is 21.6 kg/m.  BP Readings from Last 3 Encounters:  06/05/22 104/67  05/15/22 (Abnormal) 103/56  02/02/22 106/63    Wt Readings from Last 3 Encounters:  06/05/22 118 lb 1.9 oz (53.6 kg)  05/15/22 120 lb (54.4 kg)  02/02/22 122 lb 6.4 oz (55.5 kg)    Physical Exam Vitals and nursing note reviewed.  Constitutional:      Appearance: Normal appearance. She is well-developed.  HENT:     Head: Normocephalic and atraumatic.     Nose: Nose normal.     Mouth/Throat:     Mouth: Mucous membranes are moist.     Pharynx: Oropharynx is clear.  Eyes:     Extraocular Movements: Extraocular movements intact.     Conjunctiva/sclera: Conjunctivae normal.     Pupils: Pupils are equal, round, and reactive to light.  Neck:     Vascular: No carotid bruit.  Cardiovascular:     Rate and Rhythm: Normal rate and regular rhythm.     Pulses: Normal pulses.     Heart sounds: Normal heart sounds.  Pulmonary:     Effort: Pulmonary effort is normal.     Breath sounds: Normal breath sounds.  Abdominal:     Palpations: Abdomen is soft.  Genitourinary:    Comments: Urine sample positive for small WBC only  Musculoskeletal:        General: Normal range of motion.     Cervical back: Normal range of motion and neck supple.  Lymphadenopathy:     Cervical: No cervical adenopathy.  Skin:    General: Skin is warm and dry.     Capillary Refill: Capillary refill takes less than 2  seconds.  Neurological:     General: No focal deficit present.     Mental Status: She is alert and oriented to person, place, and time.  Psychiatric:        Mood and Affect: Mood normal.        Behavior: Behavior normal.        Thought Content: Thought content normal.        Judgment: Judgment normal.      Results for orders placed or performed in visit on 06/05/22  POCT glycosylated hemoglobin (Hb A1C)  Result Value Ref Range   Hemoglobin A1C     HbA1c POC (<> result, manual entry) 6.3 4.0 - 5.6 %   HbA1c, POC (prediabetic range)     HbA1c, POC (controlled diabetic range)    POCT UA - Microalbumin  Result Value Ref Range   Microalbumin Ur, POC 10 mg/L   Creatinine, POC 50 mg/dL   Albumin/Creatinine Ratio, Urine, POC 30-300   POCT URINALYSIS DIP (CLINITEK)  Result Value Ref Range   Color, UA yellow yellow   Clarity, UA clear clear   Glucose, UA negative negative mg/dL   Bilirubin, UA negative negative   Ketones, POC UA negative negative mg/dL   Spec Grav, UA 3.086 5.784 - 1.025   Blood, UA negative negative   pH, UA 6.0 5.0 - 8.0   POC PROTEIN,UA negative negative, trace   Urobilinogen, UA 0.2 0.2 or 1.0 E.U./dL   Nitrite, UA Negative Negative   Leukocytes, UA Small (1+) (A) Negative    Assessment & Plan    Type 2 diabetes mellitus with diabetic chronic kidney disease, unspecified CKD stage, unspecified whether long term insulin use Assessment & Plan: HgbA1c 6.3 today  Abnormal microalbumin.  Continue to monitor blood sugars routinely.  -check HgbA1c in 3 months.   Orders: -     POCT glycosylated hemoglobin (Hb A1C) -     POCT UA - Microalbumin  Urinary tract infection with hematuria, site unspecified Assessment & Plan: Urine sample positive for small WBC only.  Recent infection with Klebsiella treated with macrobid Treat with cephalexin 500 mg TID for  days.  Send urine for culture and sensitivity and adjust antibiotics as indicated   Orders: -     POCT  URINALYSIS DIP (CLINITEK) -     Urine Culture; Future -     Cephalexin; Take 1 capsule (500 mg total) by mouth 3 (three) times daily.  Dispense: 15 capsule; Refill: 0  Acquired hypothyroidism Assessment & Plan: Check TSH, T3, and Free T4 today.  Adjust levothyroxine dose as indicated    Orders: -     Levothyroxine Sodium; Take 1 tablet (75 mcg total) by mouth daily before breakfast.  Dispense: 90 tablet; Refill: 0 -     TSH; Future -     T4, free; Future -     T3; Future     Return in about 3 months (around 09/04/2022) for diabetes with HgbA1c check. mwv 01/2023.         Carlean Jews, NP  [facility] at [facility] [phone] (phone) [phone] (fax)  [facility]

## 2022-06-05 NOTE — Assessment & Plan Note (Signed)
Check TSH, T3, and Free T4 today.  Adjust levothyroxine dose as indicated

## 2022-06-05 NOTE — Assessment & Plan Note (Signed)
HgbA1c 6.3 today  Abnormal microalbumin.  Continue to monitor blood sugars routinely.  -check HgbA1c in 3 months.

## 2022-06-05 NOTE — Assessment & Plan Note (Signed)
Urine sample positive for small WBC only.  Recent infection with Klebsiella treated with macrobid Treat with cephalexin 500 mg TID for  days.  Send urine for culture and sensitivity and adjust antibiotics as indicated

## 2022-06-06 LAB — TSH: TSH: 0.182 u[IU]/mL — ABNORMAL LOW (ref 0.450–4.500)

## 2022-06-06 LAB — T4, FREE: Free T4: 1.52 ng/dL (ref 0.82–1.77)

## 2022-06-06 LAB — T3: T3, Total: 120 ng/dL (ref 71–180)

## 2022-06-08 LAB — URINE CULTURE

## 2022-06-11 NOTE — Progress Notes (Signed)
Please let the patient know that her thyroid panel looked good. We will continue to monitor it closely.  Thanks so much.   -HB

## 2022-06-12 ENCOUNTER — Telehealth: Payer: Self-pay | Admitting: *Deleted

## 2022-06-12 NOTE — Telephone Encounter (Signed)
Pt returned call and I gave her the below information   Carlean Jews, NP 06/11/2022  4:54 PM EDT     Please let the patient know that her thyroid panel looked good. We will continue to monitor it closely. Thanks so much.   -HB

## 2022-06-14 ENCOUNTER — Ambulatory Visit
Admission: EM | Admit: 2022-06-14 | Discharge: 2022-06-14 | Disposition: A | Discharge status: Home / Self Care | Payer: Medicare Other | Attending: Family Medicine | Admitting: Family Medicine

## 2022-06-14 DIAGNOSIS — R300 Dysuria: Secondary | ICD-10-CM | POA: Diagnosis not present

## 2022-06-14 LAB — POCT URINALYSIS DIP (MANUAL ENTRY)
Bilirubin, UA: NEGATIVE
Blood, UA: NEGATIVE
Glucose, UA: NEGATIVE mg/dL
Ketones, POC UA: NEGATIVE mg/dL
Nitrite, UA: NEGATIVE
Protein Ur, POC: NEGATIVE mg/dL
Spec Grav, UA: 1.015 (ref 1.010–1.025)
Urobilinogen, UA: 0.2 E.U./dL
pH, UA: 7 (ref 5.0–8.0)

## 2022-06-14 MED ORDER — CEFUROXIME AXETIL 250 MG PO TABS: 250.0000 mg | ORAL_TABLET | Freq: Two times a day (BID) | ORAL | 0 refills | Status: AC

## 2022-06-14 NOTE — ED Triage Notes (Signed)
Pt presents with burning during urination X 2 days after completing antibiotics for urinary tract infection X 1 week.

## 2022-06-14 NOTE — Discharge Instructions (Signed)
Urinalysis has some white blood cells, which could be consistent with a urinary infection.  Urine culture is sent, and staff will notify if it looks like you should stop the antibiotics or if you need a different antibiotic.  Cefuroxime 250 mg--1 tablet 2 times daily with a meal for 7 days

## 2022-06-14 NOTE — ED Provider Notes (Signed)
[facility]    CSN: [id number] Arrival date & time: 06/14/22  0816      History   Chief Complaint No chief complaint on file.   HPI [name] is a 77 y.o. female.   HPI Here with intermittent dysuria.  She has not had any fever or chills or nausea or vomiting. On April 2 she was seen here and had Klebsiella on urine culture and it was pansensitive.  She was treated at that time with Macrodantin, and symptoms improved but did not completely go away.  She then was seen by her primary April 23 and begun on Keflex for possible UTI.  That urine culture just showed mixed urogenital flora.  She comes in today because she is still having the intermittent dysuria.      Past Medical History:  Diagnosis Date   Bipolar disorder (HCC)    Cataract    Phreesia 09/27/2019   Chills without fever    intermittant   Depression    Diabetes mellitus without complication (HCC)    Phreesia 09/27/2019   History of colon polyps    2009 TUBULAR ADENOMA   History of concussion    02-05-2012--  fell and hit head (had previous falls prior to this day) dx mild concussion -- per MRI and neurologist note (dr Pearlean Brownie)  right frontal lob w/ shear injuries consistant with fall/trauma   History of Graves' disease    s/p  radiactive iodine therapy 1989   History of primary hyperparathyroidism    W/  HYPERCALCEMIA--  s/p  left inferior parathyroidectomy 2014   Hyperlipidemia    Hypothyroidism, postradioiodine therapy 1989  approx   endocrinologist-  dr Andres Labrum [facility]   Increased urinary frequency    Osteoporosis    Thyroid disease    Phreesia 09/27/2019   Type 2 diabetes mellitus (HCC)     Patient Active Problem List   Diagnosis Date Noted   Urinary tract infection with hematuria 05/15/2022   Dysuria 05/15/2022   Acute upper respiratory infection 02/16/2021   Abnormal weight loss 03/30/2020   Cholelithiasis without obstruction 03/30/2020   Chronic idiopathic  constipation 03/30/2020   Cirrhosis, nonalcoholic (HCC) 03/30/2020   Constipation 03/30/2020   Family history of malignant neoplasm of gastrointestinal tract 03/30/2020   Fatty liver 03/30/2020   Gastroesophageal reflux disease 03/30/2020   Gastro-esophageal reflux disease with esophagitis 03/30/2020   Imaging of gastrointestinal tract abnormal 03/30/2020   Personal history of colonic polyps 03/30/2020   Rectal bleeding 03/30/2020   Pre-diabetes 09/10/2018   Muscle spasm 06/17/2018   Acute bilateral low back pain without sciatica 06/17/2018   Disease of thyroid gland 11/25/2017   Diabetes mellitus due to non-steroid drug without complication (HCC) 04/23/2017   Healthcare maintenance 04/23/2017   Type 2 diabetes mellitus (HCC) 04/23/2017   Hyperlipidemia 04/23/2017   Decreased GFR 04/23/2017   Elevated LFTs 04/23/2017   Osteoporosis 08/23/2015   Primary hyperparathyroidism (HCC) 03/27/2012   Abdominal pain    Hyponatremia 02/13/2012   Chronic diastolic CHF (congestive heart failure) (HCC) 02/13/2012   Hypothyroidism 02/11/2012   CVA (cerebral infarction) 02/10/2012   Ataxia 02/10/2012   Bipolar 1 disorder (HCC) 02/10/2012   Hypotension, postural 02/09/2012    Class: Acute   Bipolar 1 disorder, manic, moderate (HCC) 02/05/2012    Past Surgical History:  Procedure Laterality Date   ABDOMINAL HYSTERECTOMY N/A    Phreesia 09/27/2019   CHOLECYSTECTOMY N/A    Phreesia 09/27/2019   CYSTOSCOPY N/A 12/19/2015  Procedure: CYSTOSCOPY, EXAM UNDER ANESTHESIA;  Surgeon: Ihor Gully, MD;  Location: [facility];  Service: Urology;  Laterality: N/A;   PARATHYROIDECTOMY Left 03/27/2012   Procedure: Left Inferior Mininally Invasive PARATHYROIDECTOMY;  Surgeon: Velora Heckler, MD;  Location: [facility];  Service: General;  Laterality: Left;  Left Inferior Mininally Invasive Parathyroidectomy---  hypercellular adenoma   TRANSOBTURATOR SLING  09/01/2004   transvaginal mesh placement      TUBAL LIGATION  1978   VAGINAL HYSTERECTOMY  1986    WITH OVARIAN PRESERVATION     OB History     Gravida  1   Para  1   Term      Preterm      AB      Living  1      SAB      IAB      Ectopic      Multiple      Live Births               Home Medications    Prior to Admission medications   Medication Sig Start Date End Date Taking? Authorizing Provider  cefUROXime (CEFTIN) 250 MG tablet Take 1 tablet (250 mg total) by mouth 2 (two) times daily with a meal for 7 days. 06/14/22 06/21/22 Yes Lateria Alderman, Janace Aris, MD  acetaminophen (TYLENOL) 500 MG tablet Take 500 mg by mouth every 6 (six) hours as needed. For pain    [provider]  Baclofen 5 MG TABS Take 1 tablet by mouth twice daily as needed. 07/08/19   Mayer Masker, PA-C  calcipotriene-betamethasone (TACLONEX SCALP) external suspension Apply topically daily as needed.     [provider]  clobetasol cream (TEMOVATE) 0.05 % Apply 1 application topically 2 (two) times daily as needed.     [provider]  fluticasone (CUTIVATE) 0.05 % cream Apply topically 2 (two) times daily as needed.     [provider]  fluticasone (FLONASE) 50 MCG/ACT nasal spray SHAKE LIQUID AND USE 1 SPRAY IN EACH NOSTRIL DAILY 09/22/20   Mayer Masker, PA-C  FREESTYLE LITE test strip Use to check blood sugars every morning fasting and 2 hours after largest meal 05/15/22   Carlean Jews, NP  ketoconazole (NIZORAL) 2 % cream Apply 1 application topically daily as needed.     [provider]  Lancets (FREESTYLE) lancets Use to check blood sugars every morning fasting and 2 hours after largest meal 03/23/21   Abonza, Maritza, PA-C  levothyroxine (SYNTHROID) 75 MCG tablet Take 1 tablet (75 mcg total) by mouth daily before breakfast. 06/05/22   Boscia, Kathlynn Grate, NP  SEROQUEL 50 MG tablet Take 1 tablet by mouth at bedtime. 02/11/17   [provider]    Family History Family History   Problem Relation Age of Onset   Cancer Mother        MELANOMA   Diabetes Maternal Aunt    Heart attack Brother    Diabetes Maternal Uncle     Social History Social History   Tobacco Use   Smoking status: Former    Packs/day: 1.00    Years: 30.00    Additional pack years: 0.00    Total pack years: 30.00    Types: Cigarettes    Quit date: 03/18/1997    Years since quitting: 25.2   Smokeless tobacco: Never  Vaping Use   Vaping Use: Never used  Substance Use Topics   Alcohol use: No    Alcohol/week:  0.0 standard drinks of alcohol   Drug use: No     Allergies   Aspirin, Lipitor [atorvastatin calcium], Trimethoprim, Vivelle [estradiol], Zetia [ezetimibe], Zocor [simvastatin], and Sulfa antibiotics   Review of Systems Review of Systems   Physical Exam Triage Vital Signs ED Triage Vitals  Enc Vitals Group     BP 06/14/22 0900 113/64     Pulse Rate 06/14/22 0900 (!) 58     Resp 06/14/22 0900 17     Temp 06/14/22 0900 97.7 F (36.5 C)     Temp Source 06/14/22 0900 Oral     SpO2 06/14/22 0900 97 %     Weight --      Height --      Head Circumference --      Peak Flow --      Pain Score 06/14/22 0901 4     Pain Loc --      Pain Edu? --      Excl. in GC? --    No data found.  Updated Vital Signs BP 113/64 (BP Location: Right Arm)   Pulse (!) 58   Temp 97.7 F (36.5 C) (Oral)   Resp 17   SpO2 97%   Visual Acuity Right Eye Distance:   Left Eye Distance:   Bilateral Distance:    Right Eye Near:   Left Eye Near:    Bilateral Near:     Physical Exam Vitals reviewed.  Constitutional:      General: She is not in acute distress.    Appearance: She is not ill-appearing, toxic-appearing or diaphoretic.  HENT:     Nose: Nose normal.     Mouth/Throat:     Mouth: Mucous membranes are moist.     Pharynx: No oropharyngeal exudate or posterior oropharyngeal erythema.  Eyes:     Extraocular Movements: Extraocular movements intact.     Conjunctiva/sclera:  Conjunctivae normal.     Pupils: Pupils are equal, round, and reactive to light.  Cardiovascular:     Rate and Rhythm: Normal rate and regular rhythm.     Heart sounds: No murmur heard. Pulmonary:     Effort: Pulmonary effort is normal.     Breath sounds: Normal breath sounds.  Abdominal:     Palpations: Abdomen is soft.     Tenderness: There is no abdominal tenderness.  Musculoskeletal:     Cervical back: Neck supple.  Lymphadenopathy:     Cervical: No cervical adenopathy.  Skin:    Coloration: Skin is not jaundiced or pale.  Neurological:     General: No focal deficit present.     Mental Status: She is alert.  Psychiatric:        Behavior: Behavior normal.      UC Treatments / Results  Labs (all labs ordered are listed, but only abnormal results are displayed) Labs Reviewed  POCT URINALYSIS DIP (MANUAL ENTRY) - Abnormal; Notable for the following components:      Result Value   Leukocytes, UA Small (1+) (*)    All other components within normal limits  URINE CULTURE    EKG   Radiology No results found.  Procedures Procedures (including critical care time)  Medications Ordered in UC Medications - No data to display  Initial Impression / Assessment and Plan / UC Course  I have reviewed the triage vital signs and the nursing notes.  Pertinent labs & imaging results that were available during my care of the patient were reviewed by me and considered  in my medical decision making (see chart for details).        UA shows some leuks only.  Urine culture is sent.  I am going to go ahead and treat with cefuroxime.  She does have reduced kidney function, so I am going to stay away from quinolones.  I did discuss with her that she may need to follow-up with primary care but she may need to see urology since she is having dysuria that at times does not have UTI on a culture. Final Clinical Impressions(s) / UC Diagnoses   Final diagnoses:  Dysuria     Discharge  Instructions      Urinalysis has some white blood cells, which could be consistent with a urinary infection.  Urine culture is sent, and staff will notify if it looks like you should stop the antibiotics or if you need a different antibiotic.  Cefuroxime 250 mg--1 tablet 2 times daily with a meal for 7 days     ED Prescriptions     Medication Sig Dispense Auth. Provider   cefUROXime (CEFTIN) 250 MG tablet Take 1 tablet (250 mg total) by mouth 2 (two) times daily with a meal for 7 days. 14 tablet Nalee Lightle, Janace Aris, MD      PDMP not reviewed this encounter.   Zenia Resides, MD 06/14/22 [phone]

## 2022-06-15 LAB — URINE CULTURE: Culture: NO GROWTH

## 2022-07-31 DIAGNOSIS — F3174 Bipolar disorder, in full remission, most recent episode manic: Secondary | ICD-10-CM | POA: Diagnosis not present

## 2022-07-31 DIAGNOSIS — F5105 Insomnia due to other mental disorder: Secondary | ICD-10-CM | POA: Diagnosis not present

## 2022-08-17 ENCOUNTER — Other Ambulatory Visit: Payer: Self-pay | Admitting: Family Medicine

## 2022-08-17 ENCOUNTER — Encounter: Payer: Self-pay | Admitting: Nurse Practitioner

## 2022-08-17 DIAGNOSIS — Z0000 Encounter for general adult medical examination without abnormal findings: Secondary | ICD-10-CM

## 2022-08-17 DIAGNOSIS — Z1231 Encounter for screening mammogram for malignant neoplasm of breast: Secondary | ICD-10-CM

## 2022-08-29 ENCOUNTER — Other Ambulatory Visit: Payer: Self-pay | Admitting: Nurse Practitioner

## 2022-08-29 DIAGNOSIS — E039 Hypothyroidism, unspecified: Secondary | ICD-10-CM

## 2022-09-04 ENCOUNTER — Ambulatory Visit (INDEPENDENT_AMBULATORY_CARE_PROVIDER_SITE_OTHER): Payer: Medicare Other | Admitting: Family Medicine

## 2022-09-04 ENCOUNTER — Encounter: Payer: Self-pay | Admitting: Family Medicine

## 2022-09-04 VITALS — BP 89/58 | HR 62 | Temp 97.6°F | Ht 62.0 in | Wt 120.2 lb

## 2022-09-04 DIAGNOSIS — E119 Type 2 diabetes mellitus without complications: Secondary | ICD-10-CM

## 2022-09-04 LAB — POCT GLYCOSYLATED HEMOGLOBIN (HGB A1C): Hemoglobin A1C: 7 % — AB (ref 4.0–5.6)

## 2022-09-04 NOTE — Progress Notes (Signed)
   Established Patient Office Visit  Subjective   Patient ID: [name], female    DOB: 1945/11/28  Age: 77 y.o. MRN: [id number]  Chief Complaint  Patient presents with   Diabetes    HPI [name] is a 77 y.o. female presenting today for follow up of diabetes. Diabetes: denies hypoglycemic events, wounds or sores that are not healing well, increased thirst or urination. Denies vision problems, eye exam completed 02/21/2022. Checking glucose at home, ranges have been around 100 fasting. Has been following low carb diet and dancing for exercise. She did get sick and take a course of antibiotics within the past few months, and notes that she has had increased stress.  ROS Negative unless otherwise noted in HPI   Objective:     BP (!) 89/58   Pulse 62   Temp 97.6 F (36.4 C) (Oral)   Ht 5\' 2"  (1.575 m)   Wt 120 lb 4 oz (54.5 kg)   SpO2 94%   BMI 21.99 kg/m   Physical Exam Constitutional:      General: She is not in acute distress.    Appearance: Normal appearance.  HENT:     Head: Normocephalic and atraumatic.  Cardiovascular:     Rate and Rhythm: Normal rate and regular rhythm.     Heart sounds: No murmur heard.    No friction rub. No gallop.  Pulmonary:     Effort: Pulmonary effort is normal. No respiratory distress.     Breath sounds: No wheezing, rhonchi or rales.  Skin:    General: Skin is warm and dry.  Neurological:     Mental Status: She is alert and oriented to person, place, and time.    Results for orders placed or performed in visit on 09/04/22  POCT HgB A1C  Result Value Ref Range   Hemoglobin A1C 7.0 (A) 4.0 - 5.6 %   HbA1c POC (<> result, manual entry)     HbA1c, POC (prediabetic range)     HbA1c, POC (controlled diabetic range)       Assessment & Plan:  Type 2 diabetes mellitus without complication, without long-term current use of insulin (HCC) Assessment & Plan: Goal A1C <7.0, right at goal today which is an increase from 6.5 last  check. Continue low carb diet and dancing, will recheck A1C at next visit. Possible that slight increase is secondary to increased stress, illness, course of antibiotics.  We did discuss that if we need to start medication at the next appointment, we can do so.  Patient verbalized understanding and is agreeable to this plan.  Orders: -     POCT glycosylated hemoglobin (Hb A1C)    Return in about 3 months (around 12/05/2022) for follow-up for HTN, HLD, thyroid, fasting blood work 1 week before.    Melida Quitter, PA

## 2022-09-04 NOTE — Assessment & Plan Note (Signed)
Goal A1C <7.0, right at goal today which is an increase from 6.5 last check. Continue low carb diet and dancing, will recheck A1C at next visit. Possible that slight increase is secondary to increased stress, illness, course of antibiotics.  We did discuss that if we need to start medication at the next appointment, we can do so.  Patient verbalized understanding and is agreeable to this plan.

## 2022-09-24 ENCOUNTER — Encounter: Payer: Self-pay | Admitting: Family Medicine

## 2022-10-01 ENCOUNTER — Ambulatory Visit
Admission: RE | Admit: 2022-10-01 | Discharge: 2022-10-01 | Disposition: A | Discharge status: Home / Self Care | Payer: Medicare Other | Source: Ambulatory Visit | Attending: Family Medicine | Admitting: Family Medicine

## 2022-10-01 DIAGNOSIS — Z0000 Encounter for general adult medical examination without abnormal findings: Secondary | ICD-10-CM

## 2022-10-01 DIAGNOSIS — Z1231 Encounter for screening mammogram for malignant neoplasm of breast: Secondary | ICD-10-CM | POA: Diagnosis not present

## 2022-10-29 ENCOUNTER — Encounter: Payer: Self-pay | Admitting: Family Medicine

## 2022-10-29 DIAGNOSIS — E119 Type 2 diabetes mellitus without complications: Secondary | ICD-10-CM

## 2022-10-29 DIAGNOSIS — N1831 Chronic kidney disease, stage 3a: Secondary | ICD-10-CM

## 2022-10-29 DIAGNOSIS — E039 Hypothyroidism, unspecified: Secondary | ICD-10-CM

## 2022-10-29 DIAGNOSIS — Z1321 Encounter for screening for nutritional disorder: Secondary | ICD-10-CM

## 2022-10-29 DIAGNOSIS — E785 Hyperlipidemia, unspecified: Secondary | ICD-10-CM

## 2022-11-08 ENCOUNTER — Encounter: Payer: Self-pay | Admitting: Family Medicine

## 2022-11-27 DIAGNOSIS — F3174 Bipolar disorder, in full remission, most recent episode manic: Secondary | ICD-10-CM | POA: Diagnosis not present

## 2022-11-27 DIAGNOSIS — F5105 Insomnia due to other mental disorder: Secondary | ICD-10-CM | POA: Diagnosis not present

## 2022-11-28 ENCOUNTER — Other Ambulatory Visit: Payer: Medicare Other

## 2022-11-28 DIAGNOSIS — E119 Type 2 diabetes mellitus without complications: Secondary | ICD-10-CM

## 2022-11-28 DIAGNOSIS — E785 Hyperlipidemia, unspecified: Secondary | ICD-10-CM | POA: Diagnosis not present

## 2022-11-28 DIAGNOSIS — E039 Hypothyroidism, unspecified: Secondary | ICD-10-CM

## 2022-11-28 DIAGNOSIS — Z1321 Encounter for screening for nutritional disorder: Secondary | ICD-10-CM

## 2022-11-28 DIAGNOSIS — N1831 Chronic kidney disease, stage 3a: Secondary | ICD-10-CM | POA: Diagnosis not present

## 2022-11-29 LAB — VITAMIN D 25 HYDROXY (VIT D DEFICIENCY, FRACTURES): Vit D, 25-Hydroxy: 26.8 ng/mL — ABNORMAL LOW (ref 30.0–100.0)

## 2022-11-29 LAB — COMPREHENSIVE METABOLIC PANEL
ALT: 23 [IU]/L (ref 0–32)
AST: 22 [IU]/L (ref 0–40)
Albumin: 4.4 g/dL (ref 3.8–4.8)
Alkaline Phosphatase: 70 [IU]/L (ref 44–121)
BUN/Creatinine Ratio: 14 (ref 12–28)
BUN: 19 mg/dL (ref 8–27)
Bilirubin Total: 0.6 mg/dL (ref 0.0–1.2)
CO2: 22 mmol/L (ref 20–29)
Calcium: 10.5 mg/dL — ABNORMAL HIGH (ref 8.7–10.3)
Chloride: 112 mmol/L — ABNORMAL HIGH (ref 96–106)
Creatinine, Ser: 1.33 mg/dL — ABNORMAL HIGH (ref 0.57–1.00)
Globulin, Total: 2.4 g/dL (ref 1.5–4.5)
Glucose: 109 mg/dL — ABNORMAL HIGH (ref 70–99)
Potassium: 4.1 mmol/L (ref 3.5–5.2)
Sodium: 147 mmol/L — ABNORMAL HIGH (ref 134–144)
Total Protein: 6.8 g/dL (ref 6.0–8.5)
eGFR: 42 mL/min/{1.73_m2} — ABNORMAL LOW (ref >59)

## 2022-11-29 LAB — CBC WITH DIFFERENTIAL/PLATELET
Basophils Absolute: 0 10*3/uL (ref 0.0–0.2)
Basos: 1 %
EOS (ABSOLUTE): 0.1 10*3/uL (ref 0.0–0.4)
Eos: 2 %
Hematocrit: 40.5 % (ref 34.0–46.6)
Hemoglobin: 13.1 g/dL (ref 11.1–15.9)
Immature Grans (Abs): 0 10*3/uL (ref 0.0–0.1)
Immature Granulocytes: 0 %
Lymphocytes Absolute: 1.2 10*3/uL (ref 0.7–3.1)
Lymphs: 29 %
MCH: 29.2 pg (ref 26.6–33.0)
MCHC: 32.3 g/dL (ref 31.5–35.7)
MCV: 90 fL (ref 79–97)
Monocytes Absolute: 0.4 10*3/uL (ref 0.1–0.9)
Monocytes: 9 %
Neutrophils Absolute: 2.5 10*3/uL (ref 1.4–7.0)
Neutrophils: 59 %
Platelets: 143 10*3/uL — ABNORMAL LOW (ref 150–450)
RBC: 4.49 x10E6/uL (ref 3.77–5.28)
RDW: 12.6 % (ref 11.7–15.4)
WBC: 4.2 10*3/uL (ref 3.4–10.8)

## 2022-11-29 LAB — LIPID PANEL
Chol/HDL Ratio: 3 {ratio} (ref 0.0–4.4)
Cholesterol, Total: 198 mg/dL (ref 100–199)
HDL: 65 mg/dL (ref >39)
LDL Chol Calc (NIH): 115 mg/dL — ABNORMAL HIGH (ref 0–99)
Triglycerides: 101 mg/dL (ref 0–149)
VLDL Cholesterol Cal: 18 mg/dL (ref 5–40)

## 2022-11-29 LAB — T4F: T4,Free (Direct): 1.59 ng/dL (ref 0.82–1.77)

## 2022-11-29 LAB — TSH RFX ON ABNORMAL TO FREE T4: TSH: 0.132 u[IU]/mL — ABNORMAL LOW (ref 0.450–4.500)

## 2022-11-29 LAB — HEMOGLOBIN A1C
Est. average glucose Bld gHb Est-mCnc: 128 mg/dL
Hgb A1c MFr Bld: 6.1 % — ABNORMAL HIGH (ref 4.8–5.6)

## 2022-12-05 ENCOUNTER — Encounter: Payer: Self-pay | Admitting: Family Medicine

## 2022-12-05 ENCOUNTER — Ambulatory Visit (INDEPENDENT_AMBULATORY_CARE_PROVIDER_SITE_OTHER): Payer: Medicare Other | Admitting: Family Medicine

## 2022-12-05 VITALS — BP 102/63 | HR 56 | Resp 18 | Ht 62.0 in | Wt 118.0 lb

## 2022-12-05 DIAGNOSIS — E785 Hyperlipidemia, unspecified: Secondary | ICD-10-CM

## 2022-12-05 DIAGNOSIS — I951 Orthostatic hypotension: Secondary | ICD-10-CM

## 2022-12-05 DIAGNOSIS — E871 Hypo-osmolality and hyponatremia: Secondary | ICD-10-CM

## 2022-12-05 DIAGNOSIS — K7460 Unspecified cirrhosis of liver: Secondary | ICD-10-CM | POA: Diagnosis not present

## 2022-12-05 DIAGNOSIS — E039 Hypothyroidism, unspecified: Secondary | ICD-10-CM

## 2022-12-05 DIAGNOSIS — E559 Vitamin D deficiency, unspecified: Secondary | ICD-10-CM

## 2022-12-05 DIAGNOSIS — E119 Type 2 diabetes mellitus without complications: Secondary | ICD-10-CM | POA: Diagnosis not present

## 2022-12-05 DIAGNOSIS — N1832 Chronic kidney disease, stage 3b: Secondary | ICD-10-CM | POA: Diagnosis not present

## 2022-12-05 DIAGNOSIS — M545 Low back pain, unspecified: Secondary | ICD-10-CM

## 2022-12-05 MED ORDER — BACLOFEN 5 MG PO TABS
5.0000 mg | ORAL_TABLET | Freq: Every day | ORAL | 0 refills | Status: DC | PRN
Start: 2022-12-05 — End: 2023-08-09

## 2022-12-05 MED ORDER — VITAMIN D (ERGOCALCIFEROL) 1.25 MG (50000 UNIT) PO CAPS
50000.0000 [IU] | ORAL_CAPSULE | ORAL | 0 refills | Status: DC
Start: 2022-12-05 — End: 2023-03-21

## 2022-12-05 NOTE — Assessment & Plan Note (Signed)
Creatinine 1.330, EGFR 42.  Will continue to monitor.  Encourage patient to remain vigilant with adequate hydration to ensure kidney perfusion.  Patient verbalized understanding and is agreeable to this plan.  Will continue to monitor.

## 2022-12-05 NOTE — Assessment & Plan Note (Signed)
Stable. Continue to stay hydrated and slowly get up from sitting to standing position.

## 2022-12-05 NOTE — Assessment & Plan Note (Addendum)
Liver enzymes within normal limits.  Continue low-fat diet.  Followed by gastroenterology: Dr. Loreta Ave at [facility].

## 2022-12-05 NOTE — Assessment & Plan Note (Signed)
Goal A1C <7.0, A1C improved to 6.1. Continue low carb diet and dancing, will recheck A1C at next visit.  Congratulated patient on healthy habits she has made encouraged her to continue.  Patient verbalized understanding and is agreeable to this plan.

## 2022-12-05 NOTE — Assessment & Plan Note (Signed)
TSH low, T4 within normal limits.  Recheck in 6 weeks and adjust levothyroxine dose as indicated by results.

## 2022-12-05 NOTE — Patient Instructions (Addendum)
CHOLESTEROL SUPPLEMENTS: -Red yeast rice -CoQ10  Take Tylenol if you have any pain, but if the Tylenol does not help after 1 hour you may take Baclofen.

## 2022-12-05 NOTE — Progress Notes (Signed)
Established Patient Office Visit  Subjective   Patient ID: [name], female    DOB: [birth year]  Age: 77 y.o. MRN: [id number]  Chief Complaint  Patient presents with   Hyperlipidemia   Hypertension   Hypothyroidism    HPI [name] is a 77 y.o. female presenting today for follow up of hypotension, hyperlipidemia,  hypothyroidism. Hypotension: History of postural hypotension.  Does her best to stay hydrated. Hyperlipidemia:  Currently consuming a  low carb  diet.  She dances several times a week. The ASCVD Risk score (Arnett DK, et al., 2019) failed to calculate for the following reasons:   The patient has a prior MI or stroke diagnosis Hypothyroidism: Taking levothyroxine 75 mcg regularly in the AM away from food and vitamins. Denies fatigue, weight changes, heat/cold intolerance, skin/hair changes, bowel changes, CVS symptoms.   Outpatient Medications Prior to Visit  Medication Sig   acetaminophen (TYLENOL) 500 MG tablet Take 500 mg by mouth every 6 (six) hours as needed. For pain   calcipotriene-betamethasone (TACLONEX SCALP) external suspension Apply topically daily as needed.    clobetasol cream (TEMOVATE) 0.05 % Apply 1 application topically 2 (two) times daily as needed.    fluticasone (CUTIVATE) 0.05 % cream Apply topically 2 (two) times daily as needed.    fluticasone (FLONASE) 50 MCG/ACT nasal spray SHAKE LIQUID AND USE 1 SPRAY IN EACH NOSTRIL DAILY   FREESTYLE LITE test strip Use to check blood sugars every morning fasting and 2 hours after largest meal   ketoconazole (NIZORAL) 2 % cream Apply 1 application topically daily as needed.    Lancets (FREESTYLE) lancets Use to check blood sugars every morning fasting and 2 hours after largest meal   levothyroxine (SYNTHROID) 75 MCG tablet TAKE 1 TABLET DAILY BEFORE BREAKFAST   SEROQUEL 50 MG tablet Take 1 tablet by mouth at bedtime.   [DISCONTINUED] Baclofen 5 MG TABS Take 1 tablet by mouth twice daily as needed.    No facility-administered medications prior to visit.    ROS Negative unless otherwise noted in HPI   Objective:     BP 102/63 (BP Location: Left Arm, Patient Position: Sitting, Cuff Size: Normal)   Pulse (!) 56   Resp 18   Ht 5\' 2"  (1.575 m)   Wt 118 lb (53.5 kg)   SpO2 99%   BMI 21.58 kg/m   Physical Exam Constitutional:      General: She is not in acute distress.    Appearance: Normal appearance.  HENT:     Head: Normocephalic and atraumatic.  Cardiovascular:     Rate and Rhythm: Normal rate and regular rhythm.     Heart sounds: No murmur heard.    No friction rub. No gallop.  Pulmonary:     Effort: Pulmonary effort is normal. No respiratory distress.     Breath sounds: No wheezing, rhonchi or rales.  Skin:    General: Skin is warm and dry.  Neurological:     Mental Status: She is alert and oriented to person, place, and time.      Assessment & Plan:  Hypotension, postural Assessment & Plan: Stable. Continue to stay hydrated and slowly get up from sitting to standing position.   Hyperlipidemia, unspecified hyperlipidemia type Assessment & Plan: Last lipid panel: LDL 115, HDL 65, triglycerides 101.  Not a candidate for statin therapy with history of nonalcoholic cirrhosis and development of myalgias on simvastatin, atorvastatin, and ezetimibe.  Continue low-fat diet and routine physical activity.  In the future, if necessary may consider referral to lipid clinic.   Type 2 diabetes mellitus without complication, without long-term current use of insulin (HCC) Assessment & Plan: Goal A1C <7.0, A1C improved to 6.1. Continue low carb diet and dancing, will recheck A1C at next visit.  Congratulated patient on healthy habits she has made encouraged her to continue.  Patient verbalized understanding and is agreeable to this plan.   Hypothyroidism, unspecified type Assessment & Plan: TSH low, T4 within normal limits.  Recheck in 6 weeks and adjust levothyroxine  dose as indicated by results.   Acute bilateral low back pain without sciatica -     Baclofen; Take 1 tablet (5 mg total) by mouth daily as needed. If Tylenol does not suffice.  Dispense: 15 tablet; Refill: 0  Hyponatremia Assessment & Plan: Stable.  Will continue to monitor.   Vitamin D deficiency -     Vitamin D (Ergocalciferol); Take 1 capsule (50,000 Units total) by mouth every 7 (seven) days.  Dispense: 12 capsule; Refill: 0  Stage 3b chronic kidney disease (HCC) Assessment & Plan: Creatinine 1.330, EGFR 42.  Will continue to monitor.  Encourage patient to remain vigilant with adequate hydration to ensure kidney perfusion.  Patient verbalized understanding and is agreeable to this plan.  Will continue to monitor.   Cirrhosis, nonalcoholic (HCC) Assessment & Plan: Liver enzymes within normal limits.  Continue low-fat diet.  Followed by gastroenterology: [facility]. Loreta Ave at [facility].   Reviewed results of CBC, CMP, lipid panel, TSH and T4, vitamin D. Vitamin D low 26.8, LDL elevated 115, A1c improved to 6.1, increased creatinine and decreased GFR compared to 1 year ago.  Hypernatremia stable at 147, hyperchloremia stable at 112.  Calcium elevated to 10.5 but corrected calcium 10.2 and within normal limits.  Platelets low at 143 but have improved from 1 year ago.  TSH low 0.132, T4 within normal limits.    Return in about 6 weeks (around 01/16/2023) for repeat thyroid labs; 4 months for follow up hypoTN, DM, thyroid, POC A1C at visit.    Melida Quitter, PA

## 2022-12-05 NOTE — Assessment & Plan Note (Signed)
Last lipid panel: LDL 115, HDL 65, triglycerides 101.  Not a candidate for statin therapy with history of nonalcoholic cirrhosis and development of myalgias on simvastatin, atorvastatin, and ezetimibe.  Continue low-fat diet and routine physical activity.  In the future, if necessary may consider referral to lipid clinic.

## 2022-12-05 NOTE — Assessment & Plan Note (Signed)
Stable. Will continue to monitor.

## 2022-12-18 ENCOUNTER — Other Ambulatory Visit: Payer: Self-pay | Admitting: Family Medicine

## 2022-12-18 DIAGNOSIS — M545 Low back pain, unspecified: Secondary | ICD-10-CM

## 2022-12-24 ENCOUNTER — Encounter: Payer: Self-pay | Admitting: Family Medicine

## 2022-12-24 DIAGNOSIS — E210 Primary hyperparathyroidism: Secondary | ICD-10-CM

## 2022-12-24 DIAGNOSIS — E559 Vitamin D deficiency, unspecified: Secondary | ICD-10-CM

## 2022-12-24 DIAGNOSIS — E039 Hypothyroidism, unspecified: Secondary | ICD-10-CM

## 2023-01-16 ENCOUNTER — Other Ambulatory Visit: Payer: Medicare Other

## 2023-01-16 DIAGNOSIS — E559 Vitamin D deficiency, unspecified: Secondary | ICD-10-CM | POA: Diagnosis not present

## 2023-01-16 DIAGNOSIS — E039 Hypothyroidism, unspecified: Secondary | ICD-10-CM

## 2023-01-17 ENCOUNTER — Encounter: Payer: Self-pay | Admitting: Family Medicine

## 2023-01-17 ENCOUNTER — Other Ambulatory Visit: Payer: Self-pay | Admitting: Family Medicine

## 2023-01-17 DIAGNOSIS — E039 Hypothyroidism, unspecified: Secondary | ICD-10-CM

## 2023-01-17 LAB — T3, FREE: T3, Free: 2.4 pg/mL (ref 2.0–4.4)

## 2023-01-17 LAB — VITAMIN D 25 HYDROXY (VIT D DEFICIENCY, FRACTURES): Vit D, 25-Hydroxy: 39.3 ng/mL (ref 30.0–100.0)

## 2023-01-17 LAB — T4, FREE: Free T4: 1.34 ng/dL (ref 0.82–1.77)

## 2023-01-17 LAB — TSH: TSH: 0.192 u[IU]/mL — ABNORMAL LOW (ref 0.450–4.500)

## 2023-01-17 MED ORDER — LEVOTHYROXINE SODIUM 50 MCG PO TABS: 50.0000 ug | ORAL_TABLET | Freq: Every day | ORAL | 3 refills | Status: DC

## 2023-02-04 ENCOUNTER — Encounter: Payer: Medicare Other | Admitting: Nurse Practitioner

## 2023-02-09 ENCOUNTER — Encounter: Payer: Self-pay | Admitting: Family Medicine

## 2023-02-10 ENCOUNTER — Ambulatory Visit: Payer: Medicare Other

## 2023-02-25 ENCOUNTER — Other Ambulatory Visit: Payer: Self-pay | Admitting: Family Medicine

## 2023-02-25 ENCOUNTER — Encounter: Payer: Self-pay | Admitting: Family Medicine

## 2023-02-25 DIAGNOSIS — E1122 Type 2 diabetes mellitus with diabetic chronic kidney disease: Secondary | ICD-10-CM

## 2023-02-25 DIAGNOSIS — J329 Chronic sinusitis, unspecified: Secondary | ICD-10-CM

## 2023-02-25 DIAGNOSIS — E559 Vitamin D deficiency, unspecified: Secondary | ICD-10-CM

## 2023-02-25 MED ORDER — FLUTICASONE PROPIONATE 50 MCG/ACT NA SUSP: 1.0000 | Freq: Every day | NASAL | 3 refills | Status: DC

## 2023-02-25 MED ORDER — FREESTYLE LITE TEST VI STRP: ORAL_STRIP | 3 refills | Status: DC

## 2023-03-13 DIAGNOSIS — F3174 Bipolar disorder, in full remission, most recent episode manic: Secondary | ICD-10-CM | POA: Diagnosis not present

## 2023-03-13 DIAGNOSIS — F5105 Insomnia due to other mental disorder: Secondary | ICD-10-CM | POA: Diagnosis not present

## 2023-03-14 ENCOUNTER — Encounter: Payer: Self-pay | Admitting: Family Medicine

## 2023-03-14 DIAGNOSIS — H26491 Other secondary cataract, right eye: Secondary | ICD-10-CM | POA: Diagnosis not present

## 2023-03-14 DIAGNOSIS — Z961 Presence of intraocular lens: Secondary | ICD-10-CM | POA: Diagnosis not present

## 2023-03-14 DIAGNOSIS — E119 Type 2 diabetes mellitus without complications: Secondary | ICD-10-CM | POA: Diagnosis not present

## 2023-03-14 DIAGNOSIS — H04123 Dry eye syndrome of bilateral lacrimal glands: Secondary | ICD-10-CM | POA: Diagnosis not present

## 2023-03-14 LAB — HM DIABETES EYE EXAM

## 2023-03-21 ENCOUNTER — Other Ambulatory Visit: Payer: Self-pay | Admitting: Family Medicine

## 2023-03-21 ENCOUNTER — Encounter: Payer: Self-pay | Admitting: Family Medicine

## 2023-03-21 ENCOUNTER — Ambulatory Visit: Payer: Medicare Other

## 2023-03-21 DIAGNOSIS — Z0000 Encounter for general adult medical examination without abnormal findings: Secondary | ICD-10-CM

## 2023-03-21 DIAGNOSIS — E559 Vitamin D deficiency, unspecified: Secondary | ICD-10-CM

## 2023-03-21 MED ORDER — VITAMIN D (ERGOCALCIFEROL) 1.25 MG (50000 UNIT) PO CAPS: 50000.0000 [IU] | ORAL_CAPSULE | ORAL | 0 refills | Status: DC

## 2023-03-21 NOTE — Progress Notes (Signed)
 Subjective:   [name] is a 78 y.o. female who presents for Medicare Annual (Subsequent) preventive examination.  Visit Complete: Virtual I connected with  [name] on 03/21/23 by a audio enabled telemedicine application and verified that I am speaking with the correct person using two identifiers.  Interactive audio and video telecommunications were attempted between this provider and patient, however failed, due to patient having technical difficulties OR patient did not have access to video capability.  We continued and completed visit with audio only.  Patient Location: Home  Provider Location: Home Office  I discussed the limitations of evaluation and management by telemedicine. The patient expressed understanding and agreed to proceed.  Vital Signs: Because this visit was a virtual/telehealth visit, some criteria may be missing or patient reported. Any vitals not documented were not able to be obtained and vitals that have been documented are patient reported.    Cardiac Risk Factors include: advanced age (>41men, >80 women);diabetes mellitus;dyslipidemia     Objective:    Today's Vitals   There is no height or weight on file to calculate BMI.     03/21/2023    8:53 AM 04/23/2017   11:01 AM 12/19/2015    8:15 AM 03/24/2012    9:21 AM 02/10/2012   11:06 PM 01/29/2012    5:46 PM 01/29/2012    2:30 PM  Advanced Directives  Does Patient Have a Medical Advance Directive? Yes Yes Yes Patient does not have advance directive;Patient would not like information Patient has advance directive, copy not in chart    Type of Advance Directive Healthcare Power of [place];Living will Living will Living will;Healthcare Power of Attorney  Living will    Copy of Healthcare Power of Attorney in Chart? No - copy requested    Copy requested from family    Pre-existing out of facility DNR order (yellow form or pink MOST form)     No       Information is confidential and  restricted. Go to Review Flowsheets to unlock data.    Current Medications (verified) Outpatient Encounter Medications as of 03/21/2023  Medication Sig   acetaminophen  (TYLENOL ) 500 MG tablet Take 500 mg by mouth every 6 (six) hours as needed. For pain   Baclofen  5 MG TABS Take 1 tablet (5 mg total) by mouth daily as needed. If Tylenol  does not suffice.   calcipotriene-betamethasone (TACLONEX SCALP) external suspension Apply topically daily as needed.    clobetasol cream (TEMOVATE) 0.05 % Apply 1 application topically 2 (two) times daily as needed.    fluticasone  (CUTIVATE ) 0.05 % cream Apply topically 2 (two) times daily as needed.    fluticasone  (FLONASE ) 50 MCG/ACT nasal spray Place 1 spray into both nostrils daily.   FREESTYLE LITE test strip Use to check blood sugars every morning fasting and 2 hours after largest meal   ketoconazole (NIZORAL) 2 % cream Apply 1 application topically daily as needed.    Lancets (FREESTYLE) lancets Use to check blood sugars every morning fasting and 2 hours after largest meal   levothyroxine  (SYNTHROID ) 50 MCG tablet Take 1 tablet (50 mcg total) by mouth daily.   SEROQUEL 50 MG tablet Take 1 tablet by mouth at bedtime.   Vitamin D , Ergocalciferol , (DRISDOL ) 1.25 MG (50000 UNIT) CAPS capsule Take 1 capsule (50,000 Units total) by mouth every 7 (seven) days. (Patient not taking: Reported on 03/21/2023)   No facility-administered encounter medications on file as of 03/21/2023.    Allergies (verified) Aspirin ,  Lipitor [atorvastatin calcium], Trimethoprim, Vivelle  [estradiol ], Zetia [ezetimibe], Zocor [simvastatin], and Sulfa antibiotics   History: Past Medical History:  Diagnosis Date   Bipolar disorder (HCC)    Cataract    [name]   Cerebral infarction (HCC) 02/10/2012   IMO SNOMED Dx Update Oct 2024     Chills without fever    intermittant   Depression    Diabetes mellitus without complication (HCC)    [name]   History of  colon polyps    2009 TUBULAR ADENOMA   History of concussion    02-05-2012--  fell and hit head (had previous falls prior to this day) dx mild concussion -- per MRI and neurologist note ([name])  right frontal lob w/ shear injuries consistant with fall/trauma   History of Graves' disease    s/p  radiactive iodine therapy 1989   History of primary hyperparathyroidism    W/  HYPERCALCEMIA--  s/p  left inferior parathyroidectomy 2014   Hyperlipidemia    Hypothyroidism, postradioiodine therapy 1989  approx   end[facility]in[facility]-  [name]   Increased urinary frequency    Osteoporosis    Rectal bleeding 03/30/2020   Thyroid  disease    [name]   Type 2 diabetes mellitus [name])    Past Surgical History:  Procedure Laterality Date   ABDOMINAL HYSTERECTOMY N/A    [name]   CHOLECYSTECTOMY N/A    [name]   CYSTOSCOPY N/A 12/19/2015   Procedure: PHYLLIS CORPUS UNDER ANESTHESIA;  Surgeon: Barbara Ottelin, MD;  Location: [name];  Service: Urology;  Laterality: N/A;   PARATHYROIDECTOMY Left 03/27/2012   Procedure: Left Inferior Mininally Invasive PARATHYROIDECTOMY;  Surgeon: Barbara CHRISTELLA Spinner, MD;  Location: [facility];  Service: General;  Laterality: Left;  Left Inferior Mininally Invasive Parathyroidectomy---  hypercellular adenoma   TRANSOBTURATOR SLING  09/01/2004   transvaginal mesh placement     TUBAL LIGATION  1978   VAGINAL HYSTERECTOMY  1986    WITH OVARIAN PRESERVATION    Family History  Problem Relation Age of Onset   Cancer Mother        MELANOMA   Diabetes Maternal Aunt    Heart attack Brother    Diabetes Maternal Uncle    Social History   Socioeconomic History   Marital status: Widowed    Spouse name: Not on file   Number of children: Not on file   Years of education: Not on file   Highest education level: Not on file  Occupational History   Not on file  Tobacco Use   Smoking status: Former     Current packs/day: 0.00    Average packs/day: 1 pack/day for 30.0 years (30.0 ttl pk-yrs)    Types: Cigarettes    Start date: 03/19/1967    Quit date: 03/18/1997    Years since quitting: 26.0    Passive exposure: Past   Smokeless tobacco: Never  Vaping Use   Vaping status: Never Used  Substance and Sexual Activity   Alcohol use: No    Alcohol/week: 0.0 standard drinks of alcohol   Drug use: No   Sexual activity: Not Currently    Birth control/protection: Surgical  Other Topics Concern   Not on file  Social History Narrative   Not on file   Social Drivers of Health   Financial Resource Strain: Low Risk  (03/21/2023)   Overall Financial Resource Strain (CARDIA)    Difficulty of Paying Living Expenses: Not hard at all  Food Insecurity: No Food Insecurity (03/21/2023)   Hunger Vital Sign    Worried About Running Out of Food in the Last Year: Never true    Ran Out of Food in the Last Year: Never true  Transportation Needs: No Transportation Needs (03/21/2023)   PRAPARE - Administrator, Civil Service (Medical): No    Lack of Transportation (Non-Medical): No  Physical Activity: Insufficiently Active (03/21/2023)   Exercise Vital Sign    Days of Exercise per Week: 7 days    Minutes of Exercise per Session: 20 min  Stress: No Stress Concern Present (03/21/2023)   [employer] of Occupational Health - Occupational Stress Questionnaire    Feeling of Stress : Not at all  Social Connections: Moderately Isolated (03/21/2023)   Social Connection and Isolation Panel [NHANES]    Frequency of Communication with Friends and Family: More than three times a week    Frequency of Social Gatherings with Friends and Family: Twice a week    Attends Religious Services: More than 4 times per year    Active Member of [employer] or Organizations: No    Attends Banker Meetings: Never    Marital Status: Widowed    Tobacco Counseling Counseling given: Not Answered   Clinical  Intake:  Pre-visit preparation completed: Yes  Pain : No/denies pain     Nutritional Risks: None Diabetes: Yes CBG done?: No Did pt. bring in CBG monitor from home?: No  How often do you need to have someone help you when you read instructions, pamphlets, or other written materials from your doctor or pharmacy?: 1 - Never  Interpreter Needed?: No  Information entered by :: NAllen LPN   Activities of Daily Living    03/21/2023    8:47 AM  In your present state of health, do you have any difficulty performing the following activities:  Hearing? 0  Vision? 0  Difficulty concentrating or making decisions? 0  Walking or climbing stairs? 0  Dressing or bathing? 0  Doing errands, shopping? 0  Preparing Food and eating ? N  Using the Toilet? N  In the past six months, have you accidently leaked urine? N  Do you have problems with loss of bowel control? N  Managing your Medications? N  Managing your Finances? N  Housekeeping or managing your Housekeeping? N    Patient Care Team: Wallace Joesph LABOR, PA as PCP - General (Family Medicine) Kristie Lamprey, MD as Consulting Physician (Gastroenterology) Loetta Nottingham, MD as Consulting Physician (Endocrinology) Shona Rush, MD (Dermatology) Winfred Curlee DEL, MD (Inactive) (Gynecology) Ceil Anes, MD (Inactive) as Consulting Physician (Urology) Charmayne Molly, MD as Consulting Physician (Ophthalmology) Chipper Viviane POUR, MD as Referring Physician (Psychiatry)  Indicate any recent Medical Services you may have received from other than [facility] providers in the past year (date may be approximate).     Assessment:   This is a routine wellness examination for [name].  Hearing/Vision screen Hearing Screening - Comments:: Denies hearing issues Vision Screening - Comments:: Regular eye exams, [name]. Charmayne   Goals Addressed             This Visit's Progress    Patient Stated       03/21/2023, continue daily routine       Depression  Screen    03/21/2023    8:54 AM 09/04/2022    8:39 AM 06/05/2022    8:31 AM 02/02/2022    9:21 AM 11/01/2021    2:50 PM 04/28/2021  9:22 AM 02/28/2021    1:59 PM  PHQ 2/9 Scores  PHQ - 2 Score 0 0 0 0 0 0 0  PHQ- 9 Score 0 0 0 0 0 1 0    Fall Risk    03/21/2023    8:53 AM 09/04/2022    8:38 AM 06/05/2022    8:31 AM 11/01/2021    2:50 PM 04/28/2021    9:22 AM  Fall Risk   Falls in the past year? 0 0 0 0 0  Number falls in past yr: 0 0 0 0 0  Injury with Fall? 0 0 0 0 0  Risk for fall due to : Medication side effect No Fall Risks No Fall Risks No Fall Risks No Fall Risks  Follow up Falls prevention discussed;Falls evaluation completed Falls evaluation completed Falls evaluation completed Falls evaluation completed Falls evaluation completed    MEDICARE RISK AT HOME: Medicare Risk at Home Any stairs in or around the home?: No If so, are there any without handrails?: No Home free of loose throw rugs in walkways, pet beds, electrical cords, etc?: Yes Adequate lighting in your home to reduce risk of falls?: Yes Life alert?: No Use of a cane, walker or w/c?: No Grab bars in the bathroom?: Yes Shower chair or bench in shower?: No Elevated toilet seat or a handicapped toilet?: No  TIMED UP AND GO:  Was the test performed?  No    Cognitive Function:        03/21/2023    8:55 AM 02/02/2022    9:22 AM 09/28/2020    9:28 AM 09/29/2019    2:21 PM 03/11/2018    8:05 AM  6CIT Screen  What Year? 0 points 0 points 0 points 0 points 0 points  What month? 0 points 0 points 0 points 0 points 0 points  What time? 0 points 0 points 0 points 0 points 0 points  Count back from 20 0 points 0 points 0 points 0 points 0 points  Months in reverse 0 points 0 points 0 points 0 points 0 points  Repeat phrase 0 points 0 points 0 points 0 points 2 points  Total Score 0 points 0 points 0 points 0 points 2 points    Immunizations Immunization History  Administered Date(s) Administered   Fluad  Quad(high Dose 65+) 10/01/2018, 09/24/2022   Influenza, High Dose Seasonal PF 11/08/2017, 11/13/2019, 09/19/2021, 09/24/2022   Influenza,inj,Quad PF,6+ Mos 11/06/2021   Influenza-Unspecified 11/08/2017, 10/01/2018, 10/01/2020   Moderna Sars-Covid-2 Vaccination 03/27/2019, 04/24/2019, 12/19/2019, 07/21/2021, 10/29/2022   Pfizer Covid-19 Vaccine Bivalent Booster 35yrs & up 11/06/2021   Pneumococcal Conjugate-13 04/01/2014   Pneumococcal Polysaccharide-23 04/23/2017   Tdap 01/14/2012, 02/09/2012, 02/28/2021   Zoster Recombinant(Shingrix) 10/18/2016, 12/18/2016   Zoster, Live 10/13/2012    TDAP status: Up to date  Flu Vaccine status: Up to date  Pneumococcal vaccine status: Up to date  Covid-19 vaccine status: Completed vaccines  Qualifies for Shingles Vaccine? Yes   Zostavax completed Yes   Shingrix Completed?: Yes  Screening Tests Health Maintenance  Topic Date Due   FOOT EXAM  11/02/2022   COVID-19 Vaccine (7 - 2024-25 season) 12/24/2022   HEMOGLOBIN A1C  05/29/2023   Diabetic kidney evaluation - Urine ACR  06/05/2023   Diabetic kidney evaluation - eGFR measurement  11/28/2023   OPHTHALMOLOGY EXAM  03/13/2024   Medicare Annual Wellness (AWV)  03/20/2024   DTaP/Tdap/Td (4 - Td or Tdap) 03/01/2031   Pneumonia Vaccine 59+ Years old  Completed   INFLUENZA VACCINE  Completed   DEXA SCAN  Completed   Hepatitis C Screening  Completed   Zoster Vaccines- Shingrix  Completed   HPV VACCINES  Aged Out   Colonoscopy  Discontinued    Health Maintenance  Health Maintenance Due  Topic Date Due   FOOT EXAM  11/02/2022   COVID-19 Vaccine (7 - 2024-25 season) 12/24/2022    Colorectal cancer screening: No longer required.   Mammogram status: Completed 10/01/2022. Repeat every year  Bone Density status: Completed 03/22/2021.   Lung Cancer Screening: (Low Dose CT Chest recommended if Age 18-80 years, 20 pack-year currently smoking OR have quit w/in 15years.) does not qualify.    Lung Cancer Screening Referral: no  Additional Screening:  Hepatitis C Screening: does qualify; Completed 03/11/2018  Vision Screening: Recommended annual ophthalmology exams for early detection of glaucoma and other disorders of the eye. Is the patient up to date with their annual eye exam?  Yes  Who is the provider or what is the name of the office in which the patient attends annual eye exams? [name]. [place] If pt is not established with a provider, would they like to be referred to a provider to establish care? No .   Dental Screening: Recommended annual dental exams for proper oral hygiene  Diabetic Foot Exam: Diabetic Foot Exam: Overdue, Pt has been advised about the importance in completing this exam. Pt is scheduled for diabetic foot exam on next appointment.  Community Resource Referral / Chronic Care Management: CRR required this visit?  No   CCM required this visit?  No     Plan:     I have personally reviewed and noted the following in the patient's chart:   Medical and social history Use of alcohol, tobacco or illicit drugs  Current medications and supplements including opioid prescriptions. Patient is not currently taking opioid prescriptions. Functional ability and status Nutritional status Physical activity Advanced directives List of other physicians Hospitalizations, surgeries, and ER visits in previous 12 months Vitals Screenings to include cognitive, depression, and falls Referrals and appointments  In addition, I have reviewed and discussed with patient certain preventive protocols, quality metrics, and best practice recommendations. A written personalized care plan for preventive services as well as general preventive health recommendations were provided to patient.     Ardella FORBES Dawn, LPN   08/14/7972   After Visit Summary: (MyChart) Due to this being a telephonic visit, the after visit summary with patients personalized plan was offered to patient  via MyChart   Nurse Notes: none

## 2023-03-21 NOTE — Patient Instructions (Signed)
 [name] , Thank you for taking time to come for your Medicare Wellness Visit. I appreciate your ongoing commitment to your health goals. Please review the following plan we discussed and let me know if I can assist you in the future.   Referrals/Orders/Follow-Ups/Clinician Recommendations: none  This is a list of the screening recommended for you and due dates:  Health Maintenance  Topic Date Due   Complete foot exam   11/02/2022   COVID-19 Vaccine (7 - 2024-25 season) 12/24/2022   Hemoglobin A1C  05/29/2023   Yearly kidney health urinalysis for diabetes  06/05/2023   Yearly kidney function blood test for diabetes  11/28/2023   Eye exam for diabetics  03/13/2024   Medicare Annual Wellness Visit  03/20/2024   DTaP/Tdap/Td vaccine (4 - Td or Tdap) 03/01/2031   Pneumonia Vaccine  Completed   Flu Shot  Completed   DEXA scan (bone density measurement)  Completed   Hepatitis C Screening  Completed   Zoster (Shingles) Vaccine  Completed   HPV Vaccine  Aged Out   Colon Cancer Screening  Discontinued    Advanced directives: (Copy Requested) Please bring a copy of your health care power of attorney and living will to the office to be added to your chart at your convenience.  Next Medicare Annual Wellness Visit scheduled for next year: Yes  insert Preventive Care attachment Insert FALL PREVENTION attachment if needed

## 2023-04-09 ENCOUNTER — Ambulatory Visit (INDEPENDENT_AMBULATORY_CARE_PROVIDER_SITE_OTHER): Payer: Medicare Other | Admitting: Family Medicine

## 2023-04-09 ENCOUNTER — Encounter: Payer: Self-pay | Admitting: Family Medicine

## 2023-04-09 VITALS — BP 96/59 | HR 55 | Ht 62.0 in | Wt 121.2 lb

## 2023-04-09 DIAGNOSIS — E119 Type 2 diabetes mellitus without complications: Secondary | ICD-10-CM | POA: Diagnosis not present

## 2023-04-09 DIAGNOSIS — I951 Orthostatic hypotension: Secondary | ICD-10-CM

## 2023-04-09 DIAGNOSIS — E559 Vitamin D deficiency, unspecified: Secondary | ICD-10-CM | POA: Diagnosis not present

## 2023-04-09 DIAGNOSIS — E039 Hypothyroidism, unspecified: Secondary | ICD-10-CM

## 2023-04-09 LAB — POCT GLYCOSYLATED HEMOGLOBIN (HGB A1C)
HbA1c POC (<> result, manual entry): 6.2 % (ref 4.0–5.6)
HbA1c, POC (controlled diabetic range): 6.2 % (ref 0.0–7.0)
Hemoglobin A1C: 6.2 % — AB (ref 4.0–5.6)

## 2023-04-09 NOTE — Progress Notes (Signed)
 Established Patient Office Visit  Subjective   Patient ID: [name], female    DOB: 1945/04/07  Age: 78 y.o. MRN: [id number]  Chief Complaint  Patient presents with   Diabetes    HPI [name] is a 78 y.o. female presenting today for follow up of hypotension, diabetes, hypothyroidism.  Overall, she is feeling well.  She is still managing diabetes with nutrition/physical activity.  Changing the dose to levothyroxine 50 mcg daily has been well-tolerated.  She continues to stay hydrated and only feels dizzy occasionally when she bends over and stands up too quickly.  Outpatient Medications Prior to Visit  Medication Sig   acetaminophen (TYLENOL) 500 MG tablet Take 500 mg by mouth every 6 (six) hours as needed. For pain   Baclofen 5 MG TABS Take 1 tablet (5 mg total) by mouth daily as needed. If Tylenol does not suffice.   calcipotriene-betamethasone (TACLONEX SCALP) external suspension Apply topically daily as needed.    clobetasol cream (TEMOVATE) 0.05 % Apply 1 application topically 2 (two) times daily as needed.    fluticasone (CUTIVATE) 0.05 % cream Apply topically 2 (two) times daily as needed.    fluticasone (FLONASE) 50 MCG/ACT nasal spray Place 1 spray into both nostrils daily.   FREESTYLE LITE test strip Use to check blood sugars every morning fasting and 2 hours after largest meal   ketoconazole (NIZORAL) 2 % cream Apply 1 application topically daily as needed.    Lancets (FREESTYLE) lancets Use to check blood sugars every morning fasting and 2 hours after largest meal   levothyroxine (SYNTHROID) 50 MCG tablet Take 1 tablet (50 mcg total) by mouth daily.   SEROQUEL 50 MG tablet Take 1 tablet by mouth at bedtime.   Vitamin D, Ergocalciferol, (DRISDOL) 1.25 MG (50000 UNIT) CAPS capsule Take 1 capsule (50,000 Units total) by mouth every 7 (seven) days. After this prescription, switch to over the counter vitamin D3 2000 unit daily supplement   No facility-administered  medications prior to visit.    ROS Negative unless otherwise noted in HPI   Objective:     BP (!) 96/59   Pulse (!) 55   Ht 5\' 2"  (1.575 m)   Wt 121 lb 4 oz (55 kg)   SpO2 99%   BMI 22.18 kg/m   Physical Exam Constitutional:      General: She is not in acute distress.    Appearance: Normal appearance.  HENT:     Head: Normocephalic and atraumatic.  Cardiovascular:     Rate and Rhythm: Normal rate and regular rhythm.     Heart sounds: No murmur heard.    No friction rub. No gallop.  Pulmonary:     Effort: Pulmonary effort is normal. No respiratory distress.     Breath sounds: No wheezing, rhonchi or rales.  Skin:    General: Skin is warm and dry.  Neurological:     Mental Status: She is alert and oriented to person, place, and time.    Diabetic Foot Exam - Simple   Simple Foot Form Diabetic Foot exam was performed with the following findings: Yes 04/09/2023  9:23 AM  Visual Inspection No deformities, no ulcerations, no other skin breakdown bilaterally: Yes Sensation Testing Intact to touch and monofilament testing bilaterally: Yes Pulse Check Posterior Tibialis and Dorsalis pulse intact bilaterally: Yes Comments    Results for orders placed or performed in visit on 04/09/23  POCT glycosylated hemoglobin (Hb A1C)  Result Value Ref Range  Hemoglobin A1C 6.2 (A) 4.0 - 5.6 %   HbA1c POC (<> result, manual entry) 6.2 4.0 - 5.6 %   HbA1c, POC (prediabetic range)     HbA1c, POC (controlled diabetic range) 6.2 0.0 - 7.0 %     Assessment & Plan:  Hypotension, postural Assessment & Plan: Stable. Continue to stay hydrated and slowly get up from sitting to standing position.   Type 2 diabetes mellitus without complication, without long-term current use of insulin (HCC) Assessment & Plan: Goal A1C <7.0, A1C stable at 6.2. Continue low carb diet and dancing.  Congratulated patient on healthy habits she has made encouraged her to continue.  Patient verbalized  understanding and is agreeable to this plan.  Orders: -     POCT glycosylated hemoglobin (Hb A1C)  Hypothyroidism, unspecified type Assessment & Plan: Most recent TSH remained low, so levothyroxine dose was decreased slightly to 50 mcg daily.  Rechecking thyroid labs today.  Orders: -     TSH; Future -     T4, free; Future  Vitamin D deficiency Assessment & Plan: Rechecking vitamin D level today.  If still low, continue vitamin D prescription strength, if within normal limits can change to OTC vitamin D supplement.  Orders: -     VITAMIN D 25 Hydroxy (Vit-D Deficiency, Fractures); Future    Return in about 4 months (around 08/07/2023) for follow-up for hypotension, HLD, DM, thyroid, fasting labs 1 week before.    Melida Quitter, PA

## 2023-04-09 NOTE — Assessment & Plan Note (Signed)
 Rechecking vitamin D level today.  If still low, continue vitamin D prescription strength, if within normal limits can change to OTC vitamin D supplement.

## 2023-04-09 NOTE — Assessment & Plan Note (Signed)
 Most recent TSH remained low, so levothyroxine dose was decreased slightly to 50 mcg daily.  Rechecking thyroid labs today.

## 2023-04-09 NOTE — Assessment & Plan Note (Signed)
 Stable. Continue to stay hydrated and slowly get up from sitting to standing position.

## 2023-04-09 NOTE — Assessment & Plan Note (Signed)
 Goal A1C <7.0, A1C stable at 6.2. Continue low carb diet and dancing.  Congratulated patient on healthy habits she has made encouraged her to continue.  Patient verbalized understanding and is agreeable to this plan.

## 2023-04-10 ENCOUNTER — Encounter: Payer: Self-pay | Admitting: Family Medicine

## 2023-04-10 LAB — TSH: TSH: 3.86 u[IU]/mL (ref 0.450–4.500)

## 2023-04-10 LAB — VITAMIN D 25 HYDROXY (VIT D DEFICIENCY, FRACTURES): Vit D, 25-Hydroxy: 40.2 ng/mL (ref 30.0–100.0)

## 2023-04-10 LAB — T4, FREE: Free T4: 1.07 ng/dL (ref 0.82–1.77)

## 2023-05-02 DIAGNOSIS — K130 Diseases of lips: Secondary | ICD-10-CM | POA: Diagnosis not present

## 2023-05-02 DIAGNOSIS — L218 Other seborrheic dermatitis: Secondary | ICD-10-CM | POA: Diagnosis not present

## 2023-06-11 ENCOUNTER — Ambulatory Visit: Payer: Self-pay

## 2023-06-11 ENCOUNTER — Other Ambulatory Visit: Payer: Self-pay | Admitting: Family Medicine

## 2023-06-11 DIAGNOSIS — E559 Vitamin D deficiency, unspecified: Secondary | ICD-10-CM

## 2023-06-11 NOTE — Telephone Encounter (Signed)
 Copied from CRM [id number]. Topic: Clinical - Red Word Triage >> Jun 11, 2023  1:20 PM Lizabeth Riggs wrote: Red Word that prompted transfer to Nurse Triage:  She is dizzy, sometimes heart feels like it is beating fast, headaches, very, very tired She started a Vitamin D  and new  thyroid  medicine in Feb. 2025 and this has been going on for about a month.  Chief Complaint: heart rate, weakness Symptoms: tired, weak, irritable,SOB w/exertion Frequency: 4 to 6 weeks ago Pertinent Negatives: Patient denies SOB, chest Disposition: [] ED /[x] Urgent Care (no appt availability in office) / [] Appointment(In office/virtual)/ []  [facility]/ [] Home Care/ [] Refused Recommended Disposition /[] [facility]/ []  Follow-up with PCP Additional Notes: thyroid  medication changed from 75 mcg to 50 mcg - pt thinks this could be the issue. Pt c/o being thristy.  Pt states at times when lying down she can feel her heart flutter for a few minutes then it stops.  Dizziness comes and goes.  Attempted to scheduled with PCP office but no appt available until 07/2023. Scheduled appt with urgent care.  Reason for Disposition  [1] MILD weakness (i.e., does not interfere with ability to work, go to school, normal activities) AND [2] persists > 1 week  Answer Assessment - Initial Assessment Questions 1. DESCRIPTION: "Describe how you are feeling."     Tired, irritable, lack of energy, dizzy 2. SEVERITY: "How bad is it?"  "Can you stand and walk?"   - MILD (0-3): Feels weak or tired, but does not interfere with work, school or normal activities.   - MODERATE (4-7): Able to stand and walk; weakness interferes with work, school, or normal activities.   - SEVERE (8-10): Unable to stand or walk; unable to do usual activities.     Moderate to severe  3. ONSET: "When did these symptoms begin?" (e.g., hours, days, weeks, months)     4 to 6 weeks ago 4. CAUSE: "What do you think is causing the weakness or fatigue?"  (e.g., not drinking enough fluids, medical problem, trouble sleeping)     Yes drink about 1 gallon or more of water  per day  5. NEW MEDICINES:  "Have you started on any new medicines recently?" (e.g., opioid pain medicines, benzodiazepines, muscle relaxants, antidepressants, antihistamines, neuroleptics, beta blockers)     Change in thyroid  medication and vit D added to medication list 6. OTHER SYMPTOMS: "Do you have any other symptoms?" (e.g., chest pain, fever, cough, SOB, vomiting, diarrhea, bleeding, other areas of pain)     SOB w/exertion  7. PREGNANCY: "Is there any chance you are pregnant?" "When was your last menstrual period?"     N/a  Protocols used: Weakness (Generalized) and Fatigue-A-AH

## 2023-06-12 ENCOUNTER — Ambulatory Visit
Admission: RE | Admit: 2023-06-12 | Discharge: 2023-06-12 | Disposition: A | Discharge status: Home / Self Care | Payer: Self-pay | Source: Ambulatory Visit | Attending: Internal Medicine | Admitting: Internal Medicine

## 2023-06-12 VITALS — BP 113/70 | HR 60 | Temp 97.7°F | Resp 16 | Ht 62.0 in | Wt 120.0 lb

## 2023-06-12 DIAGNOSIS — R42 Dizziness and giddiness: Secondary | ICD-10-CM

## 2023-06-12 DIAGNOSIS — E038 Other specified hypothyroidism: Secondary | ICD-10-CM | POA: Diagnosis not present

## 2023-06-12 LAB — POCT URINALYSIS DIP (MANUAL ENTRY)
Bilirubin, UA: NEGATIVE
Blood, UA: NEGATIVE
Glucose, UA: NEGATIVE mg/dL
Ketones, POC UA: NEGATIVE mg/dL
Leukocytes, UA: NEGATIVE
Nitrite, UA: NEGATIVE
Protein Ur, POC: NEGATIVE mg/dL
Spec Grav, UA: 1.015 (ref 1.010–1.025)
Urobilinogen, UA: 0.2 U/dL
pH, UA: 6 (ref 5.0–8.0)

## 2023-06-12 LAB — POCT FASTING CBG KUC MANUAL ENTRY: POCT Glucose (KUC): 109 mg/dL — AB (ref 70–99)

## 2023-06-12 NOTE — ED Triage Notes (Signed)
"  I have been getting so tired lately and dizzy at times, this started about the 1st of March after they changed my Thyroid  medicine, she put me on Vitamin D  as well". "I did notice a fluttering in my chest in the beginning (not pain".

## 2023-06-12 NOTE — ED Provider Notes (Signed)
 [facility]    CSN: [id number] Arrival date & time: 06/12/23  1138      History   Chief Complaint Chief Complaint  Patient presents with   Dizziness    Entered by patient   Fatigue    HPI [name] is a 78 y.o. female.   [name] is a 78 y.o. female with history of DM2, CKD, hypothyroidism, and non alcoholic cirrhosis presenting for chief complaint of dizziness, daytime sleepiness, and irritability that started 1 month ago. She also reports feeling a "fluttering" feeling to the left chest 1-2 times per week when she lays down to go to sleep. Dizziness happens when she gets up and changes positions from sitting to standing. If she stands for a few seconds when first changing positions, she is "fine" and doesn't have dizziness. Denies history of vertigo and room spinning sensation. History of postural dizziness, states this feels similar. Denies chest pain associated with heart palpitations.  No recent falls, urinary symptoms, new back pain, N/V/D, shortness of breath, vision changes, or changes in gait. Denies unilateral extremity weakness, paresthesias, or headaches. No frequent intake of urinary irritants, drinks water . Denies leg swelling and orthopnea.  History of a stroke 15 years ago. She does not take blood thinners. Denies history of MI.  She wonders if new vitamin D  supplement she was started on in February 2025 caused symptoms.  History of hypothyroidism after radioiodine therapy. Levothyroxine  was changed from 75mcg to 50mcg based on blood work in December 2025, no other recent dose changes.    Dizziness   Past Medical History:  Diagnosis Date   Bipolar disorder (HCC)    Cataract    Phreesia 09/27/2019   Cerebral infarction (HCC) 02/10/2012   IMO SNOMED Dx Update Oct 2024     Chills without fever    intermittant   Depression    Diabetes mellitus without complication (HCC)    Phreesia 09/27/2019   History of colon polyps    2009 TUBULAR  ADENOMA   History of concussion    02-05-2012--  fell and hit head (had previous falls prior to this day) dx mild concussion -- per MRI and neurologist note (dr Janett Medin)  right frontal lob w/ shear injuries consistant with fall/trauma   History of Graves' disease    s/p  radiactive iodine therapy 1989   History of primary hyperparathyroidism    W/  HYPERCALCEMIA--  s/p  left inferior parathyroidectomy 2014   Hyperlipidemia    Hypothyroidism, postradioiodine therapy 1989  approx   endocrinologist-  dr balain [facility]   Increased urinary frequency    Osteoporosis    Rectal bleeding 03/30/2020   Thyroid  disease    Phreesia 09/27/2019   Type 2 diabetes mellitus (HCC)     Patient Active Problem List   Diagnosis Date Noted   Vitamin D  deficiency 04/09/2023   Calculus of gallbladder without cholecystitis without obstruction 03/30/2020   Chronic idiopathic constipation 03/30/2020   Cirrhosis, nonalcoholic (HCC) 03/30/2020   Family history of malignant neoplasm of digestive organs 03/30/2020   Gastro-esophageal reflux disease with esophagitis 03/30/2020   History of colonic polyps 03/30/2020   Hyperlipidemia 04/23/2017   CKD (chronic kidney disease) stage 3, GFR 30-59 ml/min (HCC) 04/23/2017   Elevated LFTs 04/23/2017   Osteoporosis 08/23/2015   Type 2 diabetes mellitus (HCC) 10/19/2014   Primary hyperparathyroidism (HCC) 03/27/2012   Hyponatremia 02/13/2012   Chronic diastolic CHF (congestive heart failure) (HCC) 02/13/2012   Hypothyroidism 02/11/2012  Ataxia 02/10/2012   Hypotension, postural 02/09/2012    Class: Acute   Bipolar 1 disorder, manic, moderate (HCC) 02/05/2012    Past Surgical History:  Procedure Laterality Date   ABDOMINAL HYSTERECTOMY N/A    Phreesia 09/27/2019   CHOLECYSTECTOMY N/A    Phreesia 09/27/2019   CYSTOSCOPY N/A 12/19/2015   Procedure: [facility] Earl UNDER ANESTHESIA;  Surgeon: Mark Ottelin, MD;  Location: [facility];  Service: Urology;  Laterality: N/A;   PARATHYROIDECTOMY Left 03/27/2012   Procedure: Left Inferior Mininally Invasive PARATHYROIDECTOMY;  Surgeon: Keitha Pata, MD;  Location: [facility];  Service: General;  Laterality: Left;  Left Inferior Mininally Invasive Parathyroidectomy---  hypercellular adenoma   TRANSOBTURATOR SLING  09/01/2004   transvaginal mesh placement     TUBAL LIGATION  1978   VAGINAL HYSTERECTOMY  1986    WITH OVARIAN PRESERVATION     OB History     Gravida  1   Para  1   Term      Preterm      AB      Living  1      SAB      IAB      Ectopic      Multiple      Live Births               Home Medications    Prior to Admission medications   Medication Sig Start Date End Date Taking? Authorizing Provider  Baclofen  5 MG TABS Take 1 tablet (5 mg total) by mouth daily as needed. If Tylenol  does not suffice. 12/05/22  Yes Maryclare Smoke A, PA  clobetasol cream (TEMOVATE) 0.05 % Apply 1 application topically 2 (two) times daily as needed.    Yes [provider]  fluticasone  (CUTIVATE ) 0.05 % cream Apply topically 2 (two) times daily as needed.    Yes [provider]  fluticasone  (FLONASE ) 50 MCG/ACT nasal spray Place 1 spray into both nostrils daily. 02/25/23  Yes Maryclare Smoke A, PA  levothyroxine  (SYNTHROID ) 50 MCG tablet Take 1 tablet (50 mcg total) by mouth daily. 01/17/23  Yes Maryclare Smoke A, PA  SEROQUEL 50 MG tablet Take 1 tablet by mouth at bedtime. 02/11/17  Yes [provider]  Vitamin D , Ergocalciferol , (DRISDOL ) 1.25 MG (50000 UNIT) CAPS capsule Take 1 capsule (50,000 Units total) by mouth every 7 (seven) days. After this prescription, switch to over the counter vitamin D3 2000 unit daily supplement 03/21/23  Yes Maryclare Smoke A, PA  acetaminophen  (TYLENOL ) 500 MG tablet Take 500 mg by mouth every 6 (six) hours as needed. For pain    [provider]  calcipotriene-betamethasone (TACLONEX SCALP)  external suspension Apply topically daily as needed.     [provider]  FREESTYLE LITE test strip Use to check blood sugars every morning fasting and 2 hours after largest meal 02/25/23   Maryclare Smoke A, PA  ketoconazole (NIZORAL) 2 % cream Apply 1 application topically daily as needed.     [provider]  Lancets (FREESTYLE) lancets Use to check blood sugars every morning fasting and 2 hours after largest meal 03/23/21   Abonza, Maritza, PA-C    Family History Family History  Problem Relation Age of Onset   Cancer Mother        MELANOMA   Diabetes Maternal Aunt    Heart attack Brother    Diabetes Maternal Uncle     Social History Social History   Tobacco Use  Smoking status: Former    Current packs/day: 0.00    Average packs/day: 1 pack/day for 30.0 years (30.0 ttl pk-yrs)    Types: Cigarettes    Start date: 03/19/1967    Quit date: 03/18/1997    Years since quitting: 26.2    Passive exposure: Past   Smokeless tobacco: Never  Vaping Use   Vaping status: Never Used  Substance Use Topics   Alcohol use: Not Currently   Drug use: Never     Allergies   Aspirin , Lipitor [atorvastatin calcium], Trimethoprim, Vivelle  [estradiol ], Zetia [ezetimibe], Zocor [simvastatin], and Sulfa antibiotics   Review of Systems Review of Systems  Neurological:  Positive for dizziness.  Per HPI   Physical Exam Triage Vital Signs ED Triage Vitals  Encounter Vitals Group     BP 06/12/23 1142 113/70     Systolic BP Percentile --      Diastolic BP Percentile --      Pulse Rate 06/12/23 1142 (!) 57     Resp 06/12/23 1142 16     Temp 06/12/23 1142 97.7 F (36.5 C)     Temp Source 06/12/23 1142 Oral     SpO2 06/12/23 1142 98 %     Weight 06/12/23 1143 120 lb (54.4 kg)     Height 06/12/23 1143 5\' 2"  (1.575 m)     Head Circumference --      Peak Flow --      Pain Score 06/12/23 1141 0     Pain Loc --      Pain Education --      Exclude from Growth Chart --     Orthostatic VS for the past 24 hrs:  BP- Lying Pulse- Lying BP- Standing at 0 minutes Pulse- Standing at 0 minutes  06/12/23 1158 130/70 52 121/74 73    Updated Vital Signs BP 113/70 (BP Location: Left Arm)   Pulse 60   Temp 97.7 F (36.5 C) (Oral)   Resp 16   Ht 5\' 2"  (1.575 m)   Wt 120 lb (54.4 kg)   SpO2 98%   BMI 21.95 kg/m   Visual Acuity Right Eye Distance:   Left Eye Distance:   Bilateral Distance:    Right Eye Near:   Left Eye Near:    Bilateral Near:     Physical Exam Vitals and nursing note reviewed.  Constitutional:      Appearance: She is not ill-appearing or toxic-appearing.  HENT:     Head: Normocephalic and atraumatic.     Right Ear: Hearing, tympanic membrane, ear canal and external ear normal.     Left Ear: Hearing, tympanic membrane, ear canal and external ear normal.     Nose: Nose normal.     Mouth/Throat:     Lips: Pink.     Mouth: Mucous membranes are moist. No injury or oral lesions.     Dentition: Normal dentition.     Tongue: No lesions.     Pharynx: Oropharynx is clear. Uvula midline. No pharyngeal swelling, oropharyngeal exudate, posterior oropharyngeal erythema, uvula swelling or postnasal drip.     Tonsils: No tonsillar exudate.  Eyes:     General: Lids are normal. Vision grossly intact. Gaze aligned appropriately.     Extraocular Movements: Extraocular movements intact.     Conjunctiva/sclera: Conjunctivae normal.  Neck:     Trachea: Trachea and phonation normal.  Cardiovascular:     Rate and Rhythm: Normal rate and regular rhythm.     Pulses: Normal pulses.  Heart sounds: Normal heart sounds, S1 normal and S2 normal.     Comments: +2 bilateral radial and dorsalis pedis pulses. Pulmonary:     Effort: Pulmonary effort is normal. No respiratory distress.     Breath sounds: Normal breath sounds and air entry. No wheezing, rhonchi or rales.  Chest:     Chest wall: No tenderness.  Musculoskeletal:     Cervical back: Neck  supple.  Lymphadenopathy:     Cervical: No cervical adenopathy.  Skin:    General: Skin is warm and dry.     Capillary Refill: Capillary refill takes less than 2 seconds.     Findings: No rash.  Neurological:     General: No focal deficit present.     Mental Status: She is alert and oriented to person, place, and time. Mental status is at baseline.     GCS: GCS eye subscore is 4. GCS verbal subscore is 5. GCS motor subscore is 6.     Cranial Nerves: Cranial nerves 2-12 are intact. No cranial nerve deficit, dysarthria or facial asymmetry.     Sensory: Sensation is intact. No sensory deficit.     Motor: Motor function is intact. No weakness, tremor, abnormal muscle tone or pronator drift.     Coordination: Coordination is intact. Romberg sign negative. Coordination normal. Finger-Nose-Finger Test normal.     Gait: Gait is intact. Gait normal.     Deep Tendon Reflexes: Reflexes normal.     Comments: Strength and sensation intact to bilateral upper and lower extremities (5/5). Moves all 4 extremities with normal coordination voluntarily. Non-focal neuro exam.  Gait intact. No postural dizziness with position changes in clinic on exam.   Psychiatric:        Mood and Affect: Mood normal.        Speech: Speech normal.        Behavior: Behavior normal.        Thought Content: Thought content normal.        Judgment: Judgment normal.      UC Treatments / Results  Labs (all labs ordered are listed, but only abnormal results are displayed) Labs Reviewed  POCT FASTING CBG [facility] MANUAL ENTRY - Abnormal; Notable for the following components:      Result Value   POCT Glucose ([facility]) 109 (*)    All other components within normal limits  CBC  COMPREHENSIVE METABOLIC PANEL WITH GFR  TSH  T4, FREE  POCT URINALYSIS DIP (MANUAL ENTRY)    EKG   Radiology No results found.  Procedures Procedures (including critical care time)  Medications Ordered in UC Medications - No data to  display  Initial Impression / Assessment and Plan / UC Course  I have reviewed the triage vital signs and the nursing notes.  Pertinent labs & imaging results that were available during my care of the patient were reviewed by me and considered in my medical decision making (see chart for details).   1. Dizziness, other specified hypothyroidism Unclear cause of patient's dizziness which she describes as "fogginess". She is neurologically intact to her baseline. No signs of acute stroke/intracranial abnormality on exam.  EKG is without ST/T wave changes, nonischemic, shows sinus bradycardia with ventricular rate of 55 bpm.  No murmur on exam, pulses intact distally. Low suspicion for acute cardiac cause of dizziness and fatigue, however I would like for her to follow-up with cardiology for further investigation given heart palpitations and persistent symptoms to rule out underlying cardiac cause of  symptoms.  Question symptoms related to hypothyroidism, TSH and T4 free obtained today. CBC and CMP obtained to rule out electrolyte abnormality/anemia. Urinalysis unremarkable for signs of dehydration/UTI. She does not appear to be dehydrated on exam.  Counseled patient on potential for adverse effects with medications prescribed/recommended today, strict ER and return-to-clinic precautions discussed, patient verbalized understanding.    Final Clinical Impressions(s) / UC Diagnoses   Final diagnoses:  Dizziness  Other specified hypothyroidism     Discharge Instructions      Your urinalysis looks great, no signs of dehydration. I have drawn blood work today to investigate your dizziness further. Staff will call if blood work is abnormal or requires change in treatment plan.  Continue taking medications as prescribed.  Do not go for long periods of time without eating as I believe this is contributing to dizziness.  Drink plenty of water  to stay well-hydrated.  Please schedule an  appointment for follow-up with your primary care provider in the next 3 to 4 days for recheck.  If you develop any one-sided weakness, changes in walking, changes in speech, worsening dizziness, falls, urinary symptoms, fever, chills, or severe headache or chest pain, please go to the nearest emergency room for further evaluation, otherwise you may follow-up with your primary care.     ED Prescriptions   None    PDMP not reviewed this encounter.   Starlene Eaton, [place] 06/12/23 [phone]

## 2023-06-12 NOTE — Discharge Instructions (Addendum)
 Your urinalysis looks great, no signs of dehydration. I have drawn blood work today to investigate your dizziness further. Staff will call if blood work is abnormal or requires change in treatment plan.  Continue taking medications as prescribed.  Do not go for long periods of time without eating as I believe this is contributing to dizziness.  Drink plenty of water  to stay well-hydrated.  Please schedule an appointment for follow-up with your primary care provider in the next 3 to 4 days for recheck.  If you develop any one-sided weakness, changes in walking, changes in speech, worsening dizziness, falls, urinary symptoms, fever, chills, or severe headache or chest pain, please go to the nearest emergency room for further evaluation, otherwise you may follow-up with your primary care.  I would also like for you to have an appointment with a cardiologist.  A referral to cardiology has been placed.  Staff will call in the next few days to set up this appointment.  You may also call

## 2023-06-13 LAB — CBC
Hematocrit: 39.8 % (ref 34.0–46.6)
Hemoglobin: 13.3 g/dL (ref 11.1–15.9)
MCH: 30.6 pg (ref 26.6–33.0)
MCHC: 33.4 g/dL (ref 31.5–35.7)
MCV: 92 fL (ref 79–97)
Platelets: 148 10*3/uL — ABNORMAL LOW (ref 150–450)
RBC: 4.35 x10E6/uL (ref 3.77–5.28)
RDW: 12.8 % (ref 11.7–15.4)
WBC: 3.3 10*3/uL — ABNORMAL LOW (ref 3.4–10.8)

## 2023-06-13 LAB — COMPREHENSIVE METABOLIC PANEL WITH GFR
ALT: 23 IU/L (ref 0–32)
AST: 25 IU/L (ref 0–40)
Albumin: 4.6 g/dL (ref 3.8–4.8)
Alkaline Phosphatase: 77 IU/L (ref 44–121)
BUN/Creatinine Ratio: 16 (ref 12–28)
BUN: 20 mg/dL (ref 8–27)
Bilirubin Total: 0.6 mg/dL (ref 0.0–1.2)
CO2: 23 mmol/L (ref 20–29)
Calcium: 10.6 mg/dL — ABNORMAL HIGH (ref 8.7–10.3)
Chloride: 107 mmol/L — ABNORMAL HIGH (ref 96–106)
Creatinine, Ser: 1.28 mg/dL — ABNORMAL HIGH (ref 0.57–1.00)
Globulin, Total: 2.4 g/dL (ref 1.5–4.5)
Glucose: 100 mg/dL — ABNORMAL HIGH (ref 70–99)
Potassium: 4.3 mmol/L (ref 3.5–5.2)
Sodium: 143 mmol/L (ref 134–144)
Total Protein: 7 g/dL (ref 6.0–8.5)
eGFR: 43 mL/min/{1.73_m2} — ABNORMAL LOW (ref >59)

## 2023-06-13 LAB — TSH: TSH: 2.91 u[IU]/mL (ref 0.450–4.500)

## 2023-06-13 LAB — T4, FREE: Free T4: 1.08 ng/dL (ref 0.82–1.77)

## 2023-06-17 ENCOUNTER — Other Ambulatory Visit: Payer: Self-pay | Admitting: *Deleted

## 2023-06-17 DIAGNOSIS — E1122 Type 2 diabetes mellitus with diabetic chronic kidney disease: Secondary | ICD-10-CM

## 2023-06-17 DIAGNOSIS — E785 Hyperlipidemia, unspecified: Secondary | ICD-10-CM

## 2023-06-18 ENCOUNTER — Other Ambulatory Visit

## 2023-06-18 DIAGNOSIS — E785 Hyperlipidemia, unspecified: Secondary | ICD-10-CM | POA: Diagnosis not present

## 2023-06-18 DIAGNOSIS — E1122 Type 2 diabetes mellitus with diabetic chronic kidney disease: Secondary | ICD-10-CM | POA: Diagnosis not present

## 2023-06-19 LAB — LIPID PANEL
Chol/HDL Ratio: 3.2 ratio (ref 0.0–4.4)
Cholesterol, Total: 208 mg/dL — ABNORMAL HIGH (ref 100–199)
HDL: 66 mg/dL (ref >39)
LDL Chol Calc (NIH): 130 mg/dL — ABNORMAL HIGH (ref 0–99)
Triglycerides: 65 mg/dL (ref 0–149)
VLDL Cholesterol Cal: 12 mg/dL (ref 5–40)

## 2023-06-19 LAB — HEMOGLOBIN A1C
Est. average glucose Bld gHb Est-mCnc: 131 mg/dL
Hgb A1c MFr Bld: 6.2 % — ABNORMAL HIGH (ref 4.8–5.6)

## 2023-07-03 DIAGNOSIS — F3174 Bipolar disorder, in full remission, most recent episode manic: Secondary | ICD-10-CM | POA: Diagnosis not present

## 2023-07-03 DIAGNOSIS — F5105 Insomnia due to other mental disorder: Secondary | ICD-10-CM | POA: Diagnosis not present

## 2023-08-09 ENCOUNTER — Other Ambulatory Visit: Payer: Self-pay | Admitting: Family Medicine

## 2023-08-09 DIAGNOSIS — M545 Low back pain, unspecified: Secondary | ICD-10-CM

## 2023-08-14 ENCOUNTER — Other Ambulatory Visit: Payer: Self-pay

## 2023-08-14 DIAGNOSIS — Z1231 Encounter for screening mammogram for malignant neoplasm of breast: Secondary | ICD-10-CM

## 2023-08-19 ENCOUNTER — Telehealth: Payer: Self-pay

## 2023-08-19 DIAGNOSIS — Z780 Asymptomatic menopausal state: Secondary | ICD-10-CM

## 2023-08-19 NOTE — Telephone Encounter (Signed)
 Copied from CRM [id number]. Topic: Appointments - Scheduling Inquiry for Clinic >> Aug 19, 2023  2:53 PM Tiffini S wrote: Reason for CRM: Patient called asking to schedule for a  bone density test, had a mammograph completed and was told to call to schedule to test. Please call [phone] and advise patient.

## 2023-08-30 ENCOUNTER — Other Ambulatory Visit

## 2023-08-30 ENCOUNTER — Other Ambulatory Visit: Payer: Self-pay

## 2023-08-30 DIAGNOSIS — E559 Vitamin D deficiency, unspecified: Secondary | ICD-10-CM

## 2023-08-30 DIAGNOSIS — E785 Hyperlipidemia, unspecified: Secondary | ICD-10-CM | POA: Diagnosis not present

## 2023-08-30 DIAGNOSIS — E039 Hypothyroidism, unspecified: Secondary | ICD-10-CM

## 2023-08-30 DIAGNOSIS — E1122 Type 2 diabetes mellitus with diabetic chronic kidney disease: Secondary | ICD-10-CM | POA: Diagnosis not present

## 2023-08-31 LAB — CBC WITH DIFFERENTIAL/PLATELET
Basophils Absolute: 0 x10E3/uL (ref 0.0–0.2)
Basos: 1 %
EOS (ABSOLUTE): 0.1 x10E3/uL (ref 0.0–0.4)
Eos: 2 %
Hematocrit: 41.2 % (ref 34.0–46.6)
Hemoglobin: 13.3 g/dL (ref 11.1–15.9)
Immature Grans (Abs): 0 x10E3/uL (ref 0.0–0.1)
Immature Granulocytes: 0 %
Lymphocytes Absolute: 1.1 x10E3/uL (ref 0.7–3.1)
Lymphs: 29 %
MCH: 30.4 pg (ref 26.6–33.0)
MCHC: 32.3 g/dL (ref 31.5–35.7)
MCV: 94 fL (ref 79–97)
Monocytes Absolute: 0.4 x10E3/uL (ref 0.1–0.9)
Monocytes: 10 %
Neutrophils Absolute: 2.3 x10E3/uL (ref 1.4–7.0)
Neutrophils: 58 %
Platelets: 141 x10E3/uL — ABNORMAL LOW (ref 150–450)
RBC: 4.38 x10E6/uL (ref 3.77–5.28)
RDW: 13 % (ref 11.7–15.4)
WBC: 3.9 x10E3/uL (ref 3.4–10.8)

## 2023-08-31 LAB — MICROALBUMIN / CREATININE URINE RATIO
Creatinine, Urine: 92 mg/dL
Microalb/Creat Ratio: 11 mg/g{creat} (ref 0–29)
Microalbumin, Urine: 10.5 ug/mL

## 2023-08-31 LAB — COMPREHENSIVE METABOLIC PANEL WITH GFR
ALT: 27 IU/L (ref 0–32)
AST: 26 IU/L (ref 0–40)
Albumin: 4.5 g/dL (ref 3.8–4.8)
Alkaline Phosphatase: 70 IU/L (ref 44–121)
BUN/Creatinine Ratio: 14 (ref 12–28)
BUN: 18 mg/dL (ref 8–27)
Bilirubin Total: 0.5 mg/dL (ref 0.0–1.2)
CO2: 19 mmol/L — ABNORMAL LOW (ref 20–29)
Calcium: 10.4 mg/dL — ABNORMAL HIGH (ref 8.7–10.3)
Chloride: 112 mmol/L — ABNORMAL HIGH (ref 96–106)
Creatinine, Ser: 1.33 mg/dL — ABNORMAL HIGH (ref 0.57–1.00)
Globulin, Total: 2.2 g/dL (ref 1.5–4.5)
Glucose: 112 mg/dL — ABNORMAL HIGH (ref 70–99)
Potassium: 4.5 mmol/L (ref 3.5–5.2)
Sodium: 145 mmol/L — ABNORMAL HIGH (ref 134–144)
Total Protein: 6.7 g/dL (ref 6.0–8.5)
eGFR: 41 mL/min/1.73 — ABNORMAL LOW (ref >59)

## 2023-08-31 LAB — TSH: TSH: 3.64 u[IU]/mL (ref 0.450–4.500)

## 2023-08-31 LAB — LIPID PANEL
Chol/HDL Ratio: 3 ratio (ref 0.0–4.4)
Cholesterol, Total: 200 mg/dL — ABNORMAL HIGH (ref 100–199)
HDL: 66 mg/dL (ref >39)
LDL Chol Calc (NIH): 120 mg/dL — ABNORMAL HIGH (ref 0–99)
Triglycerides: 75 mg/dL (ref 0–149)
VLDL Cholesterol Cal: 14 mg/dL (ref 5–40)

## 2023-08-31 LAB — HEMOGLOBIN A1C
Est. average glucose Bld gHb Est-mCnc: 128 mg/dL
Hgb A1c MFr Bld: 6.1 % — ABNORMAL HIGH (ref 4.8–5.6)

## 2023-08-31 LAB — VITAMIN D 25 HYDROXY (VIT D DEFICIENCY, FRACTURES): Vit D, 25-Hydroxy: 40.2 ng/mL (ref 30.0–100.0)

## 2023-09-02 ENCOUNTER — Ambulatory Visit: Payer: Self-pay

## 2023-09-06 ENCOUNTER — Ambulatory Visit (INDEPENDENT_AMBULATORY_CARE_PROVIDER_SITE_OTHER)

## 2023-09-06 VITALS — BP 93/53 | HR 53 | Temp 97.4°F | Ht 62.0 in | Wt 123.0 lb

## 2023-09-06 DIAGNOSIS — I5032 Chronic diastolic (congestive) heart failure: Secondary | ICD-10-CM

## 2023-09-06 DIAGNOSIS — E1122 Type 2 diabetes mellitus with diabetic chronic kidney disease: Secondary | ICD-10-CM | POA: Diagnosis not present

## 2023-09-06 DIAGNOSIS — E785 Hyperlipidemia, unspecified: Secondary | ICD-10-CM

## 2023-09-06 DIAGNOSIS — Z780 Asymptomatic menopausal state: Secondary | ICD-10-CM | POA: Diagnosis not present

## 2023-09-06 DIAGNOSIS — K7460 Unspecified cirrhosis of liver: Secondary | ICD-10-CM | POA: Diagnosis not present

## 2023-09-06 DIAGNOSIS — E559 Vitamin D deficiency, unspecified: Secondary | ICD-10-CM | POA: Diagnosis not present

## 2023-09-06 DIAGNOSIS — N1832 Chronic kidney disease, stage 3b: Secondary | ICD-10-CM

## 2023-09-06 DIAGNOSIS — E039 Hypothyroidism, unspecified: Secondary | ICD-10-CM | POA: Diagnosis not present

## 2023-09-06 DIAGNOSIS — I951 Orthostatic hypotension: Secondary | ICD-10-CM | POA: Diagnosis not present

## 2023-09-06 MED ORDER — LEVOTHYROXINE SODIUM 50 MCG PO TABS: 50.0000 ug | ORAL_TABLET | Freq: Every day | ORAL | 3 refills | Status: AC

## 2023-09-06 MED ORDER — FREESTYLE LITE TEST VI STRP: ORAL_STRIP | 3 refills | Status: AC

## 2023-09-06 NOTE — Assessment & Plan Note (Signed)
 A1c goal <7.0.  A1c stable at 6.1.  Continue eating low-carb/low sugar and dancing for exercise.  Will continue to monitor.

## 2023-09-06 NOTE — Assessment & Plan Note (Signed)
 Creatinine: 1.33.  eGFR: 41.  Stable from previous.  Encourage patient to remain vigilant with adequate hydration to ensure good kidney perfusion.  Continue to monitor

## 2023-09-06 NOTE — Progress Notes (Signed)
 Established Patient Office Visit  Subjective   Patient ID: [name], female    DOB: 16-Sep-1945  Age: 78 y.o. MRN: [id number]  Chief Complaint  Patient presents with   Medical Management of Chronic Issues    HPI  [name] is a 78 y.o. y/o female who presents to the clinic today for follow up on hypotension, hyperlipidemia, diabetes, hypothyroid.  Hypotension: Earlier this year, patient presented to the office with complaints of dizziness/weakness that was exacerbated with position changes such as bending or standing.  At her last visit with Joesph, it was discussed that the symptoms are likely due to orthostatic blood pressure changes with position.  Patient has been working on increasing her fluid intake to support blood pressure and hydration status as well as being mindful of how fast she is transitioning from sitting to standing, etc.  HLD: Patient not currently on any lipid-lowering medications.  She is controlling with diet mainly.  Patient reports that she has been eating more fiber and wheat bread. Avoiding fried foods/cheeses.  DM: Patient not currently on any glucose lowering medications.  Controlling mainly with diet.  She reports that she has been eating less carbs/sugar.. They do check BG at home. Denies side effects. Denies episodes of hypoglycemia. Denies new sores/wounds that are not healing.   Hypothyroid: Patient currently taking 50 mcg of Levothyroxine  daily. Reports excellent compliance with this medication. Reports taking this medication in the mornings away from other medications, food, or vitamins. Denies side effects of fatigue, skin changes, weight changes, or bowel habit changes.      ROS Per HPI.    Objective:     BP (!) 93/53   Pulse (!) 53   Temp (!) 97.4 F (36.3 C) (Oral)   Ht 5' 2 (1.575 m)   Wt 123 lb (55.8 kg)   SpO2 99%   BMI 22.50 kg/m    Physical Exam Constitutional:      General: She is not in acute distress.     Appearance: Normal appearance.  Cardiovascular:     Rate and Rhythm: Normal rate and regular rhythm.     Heart sounds: Normal heart sounds. No murmur heard.    No friction rub. No gallop.  Pulmonary:     Effort: Pulmonary effort is normal. No respiratory distress.     Breath sounds: Normal breath sounds.  Musculoskeletal:        General: No swelling.  Skin:    General: Skin is warm and dry.  Neurological:     General: No focal deficit present.     Mental Status: She is alert.  Psychiatric:        Mood and Affect: Mood normal.        Behavior: Behavior normal.        Thought Content: Thought content normal.    No results found for any visits on 09/06/23.  Last CBC Lab Results  Component Value Date   WBC 3.9 08/30/2023   HGB 13.3 08/30/2023   HCT 41.2 08/30/2023   MCV 94 08/30/2023   MCH 30.4 08/30/2023   RDW 13.0 08/30/2023   PLT 141 (L) 08/30/2023   Last metabolic panel Lab Results  Component Value Date   GLUCOSE 112 (H) 08/30/2023   NA 145 (H) 08/30/2023   K 4.5 08/30/2023   CL 112 (H) 08/30/2023   CO2 19 (L) 08/30/2023   BUN 18 08/30/2023   CREATININE 1.33 (H) 08/30/2023   EGFR 41 (L) 08/30/2023  CALCIUM 10.4 (H) 08/30/2023   PROT 6.7 08/30/2023   ALBUMIN 4.5 08/30/2023   LABGLOB 2.2 08/30/2023   AGRATIO 2.0 11/02/2021   BILITOT 0.5 08/30/2023   ALKPHOS 70 08/30/2023   AST 26 08/30/2023   ALT 27 08/30/2023   Last lipids Lab Results  Component Value Date   CHOL 200 (H) 08/30/2023   HDL 66 08/30/2023   LDLCALC 120 (H) 08/30/2023   TRIG 75 08/30/2023   CHOLHDL 3.0 08/30/2023   Last hemoglobin A1c Lab Results  Component Value Date   HGBA1C 6.1 (H) 08/30/2023   Last thyroid  functions Lab Results  Component Value Date   TSH 3.640 08/30/2023   T3TOTAL 120 06/05/2022   Last vitamin D  Lab Results  Component Value Date   VD25OH 40.2 08/30/2023      The ASCVD Risk score (Arnett DK, et al., 2019) failed to calculate for the following reasons:    Risk score cannot be calculated because patient has a medical history suggesting prior/existing ASCVD    Assessment & Plan:   Vitamin D  deficiency -     DG Bone Density; Future  Acquired hypothyroidism Assessment & Plan: Most recent TSH within normal limits.  Continue levothyroxine  50 mcg daily.  Will continue to monitor.  Orders: -     Levothyroxine  Sodium; Take 1 tablet (50 mcg total) by mouth daily.  Dispense: 90 tablet; Refill: 3  Type 2 diabetes mellitus with diabetic chronic kidney disease, unspecified CKD stage, unspecified whether long term insulin use (HCC) Assessment & Plan: A1c goal <7.0.  A1c stable at 6.1.  Continue eating low-carb/low sugar and dancing for exercise.  Will continue to monitor.  Orders: -     FreeStyle Lite Test; Use to check blood sugars every morning fasting and 2 hours after largest meal  Dispense: 200 each; Refill: 3  Postmenopausal estrogen deficiency -     DG Bone Density; Future  Hyperlipidemia, unspecified hyperlipidemia type Assessment & Plan: Last lipid panel: LDL 120, HDL 66, triglycerides 75.  LDL has improved send with recent diet changes.  Will recheck lipid panel again in 6 months.  If elevated, would recommend statin initiation.   Hypotension, postural Assessment & Plan: Stable/improved.  Advised patient to continue staying hydrated and be mindful of how fast she transitions from sitting to standing position.   Stage 3b chronic kidney disease (HCC) Assessment & Plan: Creatinine: 1.33.  eGFR: 41.  Stable from previous.  Encourage patient to remain vigilant with adequate hydration to ensure good kidney perfusion.  Continue to monitor   Cirrhosis, nonalcoholic (HCC) Assessment & Plan: Liver enzymes within normal limits.  Continue high-fiber, low-fat.  Continue regular follow-up with gastroenterology: Dr. Kristie at [facility].   Chronic diastolic CHF (congestive heart failure) (HCC)    Return in about 6 months  (around 03/08/2024) for Hypotension, preDM, HLD, thyroid .    Saddie JULIANNA Sacks, PA-C

## 2023-09-06 NOTE — Assessment & Plan Note (Signed)
 Liver enzymes within normal limits.  Continue high-fiber, low-fat.  Continue regular follow-up with gastroenterology: Dr. Kristie at [facility].

## 2023-09-06 NOTE — Assessment & Plan Note (Signed)
 Most recent TSH within normal limits.  Continue levothyroxine  50 mcg daily.  Will continue to monitor.

## 2023-09-06 NOTE — Patient Instructions (Signed)
 It was nice to see you today!  As we discussed in clinic:  -Continue all of your current medications as prescribed  -I have placed the order for your bone density scan. They will call you to get this scheduled!  -[facility] -Co-Q-10 is an over-the-counter supplement I recommend for cholesterol support   I will see you back in 6 months for a follow up! It was nice to meet you!  If you have any problems before your next visit feel free to message me via MyChart (minor issues or questions) or call the office, otherwise you may reach out to schedule an office visit.  Thank you! Saddie Sacks, PA-C

## 2023-09-06 NOTE — Assessment & Plan Note (Signed)
 Stable/improved.  Advised patient to continue staying hydrated and be mindful of how fast she transitions from sitting to standing position.

## 2023-09-06 NOTE — Assessment & Plan Note (Signed)
 Last lipid panel: LDL 120, HDL 66, triglycerides 75.  LDL has improved send with recent diet changes.  Will recheck lipid panel again in 6 months.  If elevated, would recommend statin initiation.

## 2023-09-17 ENCOUNTER — Encounter: Payer: Self-pay | Admitting: Cardiology

## 2023-09-17 ENCOUNTER — Ambulatory Visit: Attending: Cardiology | Admitting: Cardiology

## 2023-09-17 VITALS — BP 102/58 | HR 56 | Ht 62.0 in | Wt 124.2 lb

## 2023-09-17 DIAGNOSIS — I951 Orthostatic hypotension: Secondary | ICD-10-CM | POA: Diagnosis present

## 2023-09-17 DIAGNOSIS — R9431 Abnormal electrocardiogram [ECG] [EKG]: Secondary | ICD-10-CM

## 2023-09-17 DIAGNOSIS — R42 Dizziness and giddiness: Secondary | ICD-10-CM | POA: Diagnosis present

## 2023-09-17 DIAGNOSIS — E785 Hyperlipidemia, unspecified: Secondary | ICD-10-CM | POA: Diagnosis present

## 2023-09-17 NOTE — Patient Instructions (Signed)
 Medication Instructions:    No  changes *If you need a refill on your cardiac medications before your next appointment, please call your pharmacy*   Lab Work: Not needed    Testing/Procedures:  Not needed  Follow-Up: At [facility], you and your health needs are our priority.  As part of our continuing mission to provide you with exceptional heart care, we have created designated Provider Care Teams.  These Care Teams include your primary Cardiologist (physician) and Advanced Practice Providers (APPs -  Physician Assistants and Nurse Practitioners) who all work together to provide you with the care you need, when you need it.     Your next appointment:    as needed  The format for your next appointment:   In Person  Provider:   Alm Clay, MD   Other Instructions    Dr Clay will discuss your EKG  with his partners and we will be in touch

## 2023-09-17 NOTE — Progress Notes (Signed)
 Cardiology Office Note:  .   Date:  09/22/2023  ID:  [name], DOB [birth year], MRN [id number] PCP: Gayle Saddie [name]  [facility] Providers Cardiologist:  Alm Clay, MD     Chief Complaint  Patient presents with   New Patient (Initial Visit)    Abnormal EKG.  He had some dizziness issues before.    Patient Profile: .     [name] is a otherwise healthy 78 y.o. female (distant smoker) with a PMH notable for pre-DM, HLD, hypothyroidism (2/2 hyperthyroid with Radioactive iodine; & s/p parathyroidectomy), ~ Bipolar in Remission who presents here for EVALUATION OF ABNORMAL EKG at the request of Stanhope, Catharine M, *. -  Chart indicates possible history of NASH; also history of possible stroke in 2010.  Lab     [name] was last seen at [facility] on April 30 for dizziness and fluttering in her chest.  On she noted mostly orthostatic dizziness going from sitting to standing.  No syncope or near syncope.  No vertigo.  Noted fluttering in her left chest abdomen once twice a week when lying down to go to sleep.  Refer to cardiology because of palpitations and persistent symptoms.  Subjective  Discussed the use of AI scribe software for clinical note transcription with the patient, who gave verbal consent to proceed.  History of Present Illness  Emmalie L [name] is a 78 year old female who presents with an abnormal EKG finding. She was referred by her primary care physician, [name], for evaluation of an abnormal EKG finding.  An abnormal EKG finding was discovered during a routine visit-also seen during ER visit for dizziness--she was noted to be mildly hypotensive at the time... She describes the EKG as showing a 'little something' that concerned her primary care physician. No chest pain, pressure, palpitations, shortness of breath, swelling in the legs, or dizziness.  Her medical history includes prediabetes and hyperlipidemia. 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  She has a history of bipolar disorder, in remission for eleven years. She takes Seroquel primarily for sleep, which also helps stabilize her mood. No recent manic episodes or mood disturbances.  She is a former smoker but quit many years ago. She is currently active and denies any limitations in her physical activities.  Cardiovascular ROS: no chest pain or dyspnea on exertion positive for - occasional intermittent positional dizziness.  Rare fleeting palpitations. negative for - edema, orthopnea, paroxysmal nocturnal dyspnea, rapid heart rate, shortness of breath, or syncope or near syncope, CVA/TIA or amaurosis fugax, claudication      Objective   Past Medical History - Prediabetes - Hyperlipidemia - Bipolar disorder in remission - Hypothyroidism - NASH - Prior CVA?-Noted on MRI-subacute right frontal area diffusion changes.  Surgical History: - Radioactive iodine treatment: Treatment for hyperthyroidism - Parathyroidectomy (1980): Removal of parathyroid  glands due to a tumor  Social History - Tobacco: Former smoker - Partner Status: Widowed - Patient experienced stress due to issues with a friend.  Medications: Vitamin D3 25 mcg / 1000 unit p.o. daily; Flonase  nasal spray 1 spray both nostril daily; Synthroid  50 mcg daily; Seroquel 50 mg nightly. PRN Tylenol  500 mg every 6 hours; baclofen  5 mg if Tylenol  effective  Studies Reviewed: SABRA   EKG Interpretation Date/Time:  Tuesday September 17 2023 15:39:38 EDT Ventricular Rate:  56 PR Interval:  172 QRS Duration:  92 QT Interval:  402 QTC Calculation: 387 R Axis:   -31  Text Interpretation: Sinus bradycardia Left axis deviation Incomplete right bundle branch block junctionional st elevation - Early repolarization, probably normal variant Moderate voltage criteria for LVH, may be  normal variant ( R in aVL , Cornell product ) When compared with ECG of 12-Jun-2023 12:06, No significant change was found Confirmed by Anner Lenis ([id number]) on 09/17/2023 4:02:33 PM    Lab Results  Component Value Date   CHOL 200 (H) 08/30/2023   HDL 66 08/30/2023   LDLCALC 120 (H) 08/30/2023   TRIG 75 08/30/2023   CHOLHDL 3.0 08/30/2023   Lab Results  Component Value Date   NA 145 (H) 08/30/2023   K 4.5 08/30/2023   CREATININE 1.33 (H) 08/30/2023   EGFR 41 (L) 08/30/2023   GLUCOSE 112 (H) 08/30/2023   Lab Results  Component Value Date   HGBA1C 6.1 (H) 08/30/2023   EKG 06/12/2023: (Personally reviewed) sinus bradycardia-55 bpm. ~Voltage for LVH.  Junctional ST depression-probably normal.  Early repolarization pattern.  When compared to EKG from 2014-precordial voltage higher  Risk Assessment/Calculations:     Physical Exam:   VS:  BP (!) 102/58 (BP Location: Left Arm, Patient Position: Sitting, Cuff Size: Normal)   Pulse (!) 56   Ht 5' 2 (1.575 m)   Wt 124 lb 3.2 oz (56.3 kg)   SpO2 97%   BMI 22.72 kg/m    Wt Readings from Last 3 Encounters:  09/17/23 124 lb 3.2 oz (56.3 kg)  09/06/23 123 lb (55.8 kg)  06/12/23 120 lb (54.4 kg)    GEN: Healthy-Appearing.  Well-groomed, well-nourished. NECK: No JVD; No carotid bruits CARDIAC: Normal S1, S2; RRR, no murmurs, rubs, gallops RESPIRATORY:  Clear to auscultation without rales, wheezing or rhonchi ; nonlabored, good air movement. ABDOMEN: Soft, non-tender, non-distended EXTREMITIES:  No edema; No deformity     ASSESSMENT AND PLAN: .    Problem List Items Addressed This Visit       Cardiology Problems   Hyperlipidemia (Chronic)   LDL 120.  Could consider Coronary Calcium Score to That which would help determine need for initiation of lowering therapy      Relevant Orders   EKG 12-Lead (Completed)   Hypotension, postural (Chronic)   Thankfully, no recent falls or syncope/near syncope. She is not on any medications  that I can see with lower blood pressure.  Stressed importance of adequate hydration and nutrition. Stressed taking her time with standing up and core exercises.       Relevant Orders   EKG 12-Lead (Completed)     Other   Nonspecific abnormal electrocardiogram (ECG) (EKG) - Primary   Abnormal EKG EKG shows early repolarization in Lead II, consistent with previous EKGs. No cardiac symptoms. Likely benign. - Consult electrophysiologist to review EKG findings.      Orthostatic dizziness (Chronic)   Thankfully, no recent falls or syncope/near syncope. She is not on any medications that I can see with lower blood pressure.  Stressed importance of adequate hydration and nutrition. Stressed taking her time with standing up and core exercises.      Relevant Orders   EKG 12-Lead (Completed)         Follow-Up: Return if symptoms worsen or fail to improve, for Followup when necessary, To discuss test results.      Signed, Lenis MICAEL Anner, MD, MS Lenis Anner, M.D., M.S. Interventional Cardiologist  [employer] Teacher, English as a foreign language  Pager # [phone]

## 2023-09-18 NOTE — Telephone Encounter (Signed)
 According to her appt desk this has been cancelled.

## 2023-09-22 ENCOUNTER — Encounter: Payer: Self-pay | Admitting: Cardiology

## 2023-09-22 DIAGNOSIS — R42 Dizziness and giddiness: Secondary | ICD-10-CM | POA: Insufficient documentation

## 2023-09-22 NOTE — Assessment & Plan Note (Signed)
 Truthfully with an A1c of 6.1 she has prediabetes  Emphasized monitoring and lifestyle modifications to prevent progression. - Monitor blood glucose levels. - Encourage lifestyle modifications.

## 2023-09-22 NOTE — Assessment & Plan Note (Signed)
 Abnormal EKG EKG shows early repolarization in Lead II, consistent with previous EKGs. No cardiac symptoms. Likely benign. - Consult electrophysiologist to review EKG findings.

## 2023-09-22 NOTE — Assessment & Plan Note (Signed)
 LDL 120.  Could consider Coronary Calcium Score to That which would help determine need for initiation of lowering therapy

## 2023-09-22 NOTE — Assessment & Plan Note (Signed)
 Thankfully, no recent falls or syncope/near syncope. She is not on any medications that I can see with lower blood pressure.  Stressed importance of adequate hydration and nutrition. Stressed taking her time with standing up and core exercises.

## 2023-10-04 ENCOUNTER — Ambulatory Visit
Admission: RE | Admit: 2023-10-04 | Discharge: 2023-10-04 | Disposition: A | Discharge status: Home / Self Care | Source: Ambulatory Visit

## 2023-10-04 DIAGNOSIS — Z1231 Encounter for screening mammogram for malignant neoplasm of breast: Secondary | ICD-10-CM

## 2023-10-09 ENCOUNTER — Ambulatory Visit: Admitting: Cardiology

## 2023-10-17 ENCOUNTER — Telehealth: Payer: Self-pay

## 2023-10-17 NOTE — Telephone Encounter (Signed)
 Documented Flu Vaccine in patient's chart.

## 2023-10-20 ENCOUNTER — Other Ambulatory Visit: Payer: Self-pay | Admitting: Family Medicine

## 2023-10-20 DIAGNOSIS — J329 Chronic sinusitis, unspecified: Secondary | ICD-10-CM

## 2023-10-29 ENCOUNTER — Other Ambulatory Visit: Payer: Self-pay

## 2023-10-29 DIAGNOSIS — M545 Low back pain, unspecified: Secondary | ICD-10-CM

## 2023-10-30 DIAGNOSIS — F3174 Bipolar disorder, in full remission, most recent episode manic: Secondary | ICD-10-CM | POA: Diagnosis not present

## 2023-10-30 DIAGNOSIS — F5105 Insomnia due to other mental disorder: Secondary | ICD-10-CM | POA: Diagnosis not present

## 2023-12-27 DIAGNOSIS — L400 Psoriasis vulgaris: Secondary | ICD-10-CM | POA: Diagnosis not present

## 2024-02-07 ENCOUNTER — Encounter: Payer: Self-pay | Admitting: Emergency Medicine

## 2024-02-07 ENCOUNTER — Ambulatory Visit: Payer: Self-pay

## 2024-02-07 ENCOUNTER — Ambulatory Visit: Admission: EM | Admit: 2024-02-07 | Discharge: 2024-02-07 | Disposition: A | Discharge status: Home / Self Care

## 2024-02-07 DIAGNOSIS — F319 Bipolar disorder, unspecified: Secondary | ICD-10-CM | POA: Insufficient documentation

## 2024-02-07 DIAGNOSIS — J101 Influenza due to other identified influenza virus with other respiratory manifestations: Secondary | ICD-10-CM

## 2024-02-07 DIAGNOSIS — K769 Liver disease, unspecified: Secondary | ICD-10-CM | POA: Insufficient documentation

## 2024-02-07 DIAGNOSIS — Z800 Family history of malignant neoplasm of digestive organs: Secondary | ICD-10-CM | POA: Insufficient documentation

## 2024-02-07 DIAGNOSIS — L409 Psoriasis, unspecified: Secondary | ICD-10-CM | POA: Insufficient documentation

## 2024-02-07 DIAGNOSIS — E1122 Type 2 diabetes mellitus with diabetic chronic kidney disease: Secondary | ICD-10-CM | POA: Insufficient documentation

## 2024-02-07 DIAGNOSIS — J069 Acute upper respiratory infection, unspecified: Secondary | ICD-10-CM

## 2024-02-07 DIAGNOSIS — E870 Hyperosmolality and hypernatremia: Secondary | ICD-10-CM | POA: Insufficient documentation

## 2024-02-07 LAB — POCT INFLUENZA A/B
Influenza A, POC: POSITIVE — AB
Influenza B, POC: NEGATIVE

## 2024-02-07 MED ORDER — OSELTAMIVIR PHOSPHATE 75 MG PO CAPS: 75.0000 mg | ORAL_CAPSULE | Freq: Two times a day (BID) | ORAL | 0 refills | Status: DC

## 2024-02-07 NOTE — ED Triage Notes (Signed)
 Pt reports body aches, dry cough, sore throat, nasal congestion, and chills x2 days. Denies fevers. No sick contacts. Dayquil/nyquil cold & flu tabs with some relief.

## 2024-02-07 NOTE — Telephone Encounter (Signed)
 FYI Only or Action Required?: FYI only for provider: appointment scheduled on 02/10/2024 at 2:10 PM.  Patient was last seen in primary care on 09/06/2023 by Gayle Saddie FALCON, PA-C.  Called Nurse Triage reporting Sore Throat.  Symptoms began Wednesday 12/24.  Interventions attempted: OTC medications: Cold and flu medication and Rest, hydration, or home remedies.  Symptoms are: unchanged.  Triage Disposition: See PCP When Office is Open (Within 3 Days)  Patient/caregiver understands and will follow disposition?: Yes  Copied from CRM #[id number]. Topic: Clinical - Red Word Triage >> Feb 07, 2024  8:17 AM Alexandria E wrote: [facility] that prompted transfer to Nurse Triage: Throat pain and muscle aches, going on since Christmas Eve. Patient tested negative for Covid. Reason for Disposition  [1] Sore throat is the only symptom AND [2] present > 48 hours  Answer Assessment - Initial Assessment Questions Patient reports sore throat and muscle aches that started on Wednesday night. Appointment made for 02/10/2024 at 2:10 PM. Patient was wanting to be seen today-given instructions for urgent care if needed.   1. ONSET: When did the throat start hurting? (Hours or days ago)      Started on Wednesday night 2. SEVERITY: How bad is the sore throat? (Scale 1-10; mild, moderate or severe)     Mild currently 3. STREP EXPOSURE: Has there been any exposure to strep within the past week? If Yes, ask: What type of contact occurred?      no 4.  VIRAL SYMPTOMS: Are there any symptoms of a cold, such as a runny nose, cough, hoarse voice or red eyes?      yes 5. FEVER: Do you have a fever? If Yes, ask: What is your temperature, how was it measured, and when did it start?     no 6. PUS ON THE TONSILS: Is there pus on the tonsils in the back of your throat?     no 7. OTHER SYMPTOMS: Do you have any other symptoms? (e.g., difficulty breathing, headache, rash)     Muscle aches  Protocols used:  Sore Throat-A-AH

## 2024-02-07 NOTE — ED Provider Notes (Signed)
 " [facility]    CSN: [id number] Arrival date & time: 02/07/24  0920      History   Chief Complaint Chief Complaint  Patient presents with   Sore Throat   Generalized Body Aches   Cough   Chills   Nasal Congestion    HPI [name] is a 78 y.o. female.   Pt presents today due to 2 days of nasal congestion, cough, body aches , throat pain, and chills. Pt states that she has been taking OTC cold and flu medicine with some relief. Pt denies known sick contacts.   The history is provided by the patient.  Sore Throat  Cough   Past Medical History:  Diagnosis Date   Bipolar disorder (HCC)    Cataract    Phreesia 09/27/2019   Cerebral infarction (HCC) 02/10/2012   IMO SNOMED Dx Update Oct 2024     Chills without fever    intermittant   Depression    Diabetes mellitus without complication (HCC)    Phreesia 09/27/2019   History of colon polyps    2009 TUBULAR ADENOMA   History of concussion    02-05-2012--  fell and hit head (had previous falls prior to this day) dx mild concussion -- per MRI and neurologist note (dr rosemarie)  right frontal lob w/ shear injuries consistant with fall/trauma   History of Graves' disease    s/p  radiactive iodine therapy 1989   History of primary hyperparathyroidism    W/  HYPERCALCEMIA--  s/p  left inferior parathyroidectomy 2014   Hyperlipidemia    Hypothyroidism, postradioiodine therapy 1989  approx   endocrinologist-  dr [facility]   Increased urinary frequency    Osteoporosis    Rectal bleeding 03/30/2020   Thyroid  disease    Phreesia 09/27/2019   Type 2 diabetes mellitus (HCC)     Patient Active Problem List   Diagnosis Date Noted   Bipolar 1 disorder (HCC) 02/07/2024   Chronic nonalcoholic liver disease 02/07/2024   Family history of malignant neoplasm of colon 02/07/2024   Hyperosmolality and/or hypernatremia 02/07/2024   Psoriasis 02/07/2024   Type 2 diabetes mellitus with  diabetic chronic kidney disease (HCC) 02/07/2024   Orthostatic dizziness 09/22/2023   Nonspecific abnormal electrocardiogram (ECG) (EKG) 09/17/2023   Vitamin D  deficiency 04/09/2023   Calculus of gallbladder without cholecystitis without obstruction 03/30/2020   Chronic idiopathic constipation 03/30/2020   Cirrhosis, nonalcoholic (HCC) 03/30/2020   Family history of malignant neoplasm of digestive organs 03/30/2020   Gastro-esophageal reflux disease with esophagitis 03/30/2020   History of colonic polyps 03/30/2020   Hyperlipidemia 04/23/2017   Stage 3 chronic kidney disease (HCC) 04/23/2017   Elevated LFTs 04/23/2017   Osteoporosis 08/23/2015   Prediabetes 10/19/2014   Hyperparathyroidism 03/27/2012   Hyponatremia 02/13/2012   Hypothyroidism 02/11/2012   Ataxia 02/10/2012   Hypotension, postural 02/09/2012    Class: Acute   Bipolar 1 disorder, manic, moderate (HCC) 02/05/2012    Past Surgical History:  Procedure Laterality Date   ABDOMINAL HYSTERECTOMY N/A    Phreesia 09/27/2019   CHOLECYSTECTOMY N/A    Phreesia 09/27/2019   CYSTOSCOPY N/A 12/19/2015   Procedure: PHYLLIS CORPUS UNDER ANESTHESIA;  Surgeon: Mark Ottelin, MD;  Location: [facility] [place];  Service: Urology;  Laterality: N/A;   PARATHYROIDECTOMY Left 03/27/2012   Procedure: Left Inferior Mininally Invasive PARATHYROIDECTOMY;  Surgeon: Krystal CHRISTELLA Spinner, MD;  Location: [facility];  Service: General;  Laterality: Left;  Left Inferior Mininally  Invasive Parathyroidectomy---  hypercellular adenoma   TRANSOBTURATOR SLING  09/01/2004   transvaginal mesh placement     TUBAL LIGATION  1978   VAGINAL HYSTERECTOMY  1986    WITH OVARIAN PRESERVATION     OB History     Gravida  1   Para  1   Term      Preterm      AB      Living  1      SAB      IAB      Ectopic      Multiple      Live Births               Home Medications    Prior to Admission medications  Medication Sig Start Date End  Date Taking? Authorizing Provider  acetaminophen  (TYLENOL ) 500 MG tablet Take 500 mg by mouth every 6 (six) hours as needed. For pain   Yes [provider]  cholecalciferol (VITAMIN D3) 25 MCG (1000 UNIT) tablet Take 1,000 Units by mouth daily.   Yes [provider]  FREESTYLE LITE test strip Use to check blood sugars every morning fasting and 2 hours after largest meal 09/06/23  Yes Clapp, Kara F, PA-C  Lancets (FREESTYLE) lancets Use to check blood sugars every morning fasting and 2 hours after largest meal 03/23/21  Yes Abonza, Maritza, PA-C  levothyroxine  (SYNTHROID ) 50 MCG tablet Take 1 tablet (50 mcg total) by mouth daily. 09/06/23  Yes Clapp, Kara F, PA-C  SEROQUEL 50 MG tablet Take 1 tablet by mouth at bedtime. 02/11/17  Yes [provider]  alendronate (FOSAMAX) 70 MG tablet TAKE 1 TABLET ONCE A WEEK Orally once a week Patient not taking: Reported on 02/07/2024 04/18/17   [provider]  Baclofen  5 MG TABS TAKE 1 TABLET BY MOUTH DAILY AS NEEDED IF TYLENOL  DOES NOT HELP Patient not taking: Reported on 02/07/2024 10/29/23   Gayle Saddie FALCON, PA-C  calcipotriene-betamethasone [facility] SCALP) external suspension Apply topically daily as needed.  Patient not taking: Reported on 02/07/2024    [provider]  clobetasol cream (TEMOVATE) 0.05 % Apply 1 application topically 2 (two) times daily as needed.  Patient not taking: Reported on 02/07/2024    [provider]  fluticasone  (CUTIVATE ) 0.05 % cream Apply topically 2 (two) times daily as needed.  Patient not taking: Reported on 02/07/2024    [provider]  fluticasone  (FLONASE ) 50 MCG/ACT nasal spray SHAKE LIQUID AND USE 1 SPRAY IN EACH NOSTRIL DAILY Patient not taking: Reported on 02/07/2024 10/23/23   Gayle Saddie FALCON, PA-C  ketoconazole (NIZORAL) 2 % cream Apply 1 application topically daily as needed.  Patient not taking: Reported on 02/07/2024    [provider]   valACYclovir (VALTREX) 1000 MG tablet Take by mouth. Patient not taking: Reported on 02/07/2024 10/30/23   [provider]    Family History Family History  Problem Relation Age of Onset   Cancer Mother        MELANOMA   Diabetes Maternal Aunt    Heart attack Brother    Diabetes Maternal Uncle     Social History Social History[1]   Allergies   Aspirin , Atorvastatin, Ezetimibe, Lipitor [atorvastatin calcium], Simvastatin, Sulfacetamide sodium, Trimethoprim, Vivelle  [estradiol ], and Sulfa antibiotics   Review of Systems Review of Systems  Respiratory:  Positive for cough.      Physical Exam Triage Vital Signs ED Triage Vitals  Encounter Vitals Group  BP 02/07/24 1040 111/70     Girls Systolic BP Percentile --      Girls Diastolic BP Percentile --      Boys Systolic BP Percentile --      Boys Diastolic BP Percentile --      Pulse Rate 02/07/24 1040 75     Resp 02/07/24 1040 16     Temp 02/07/24 1040 97.8 F (36.6 C)     Temp Source 02/07/24 1040 Oral     SpO2 02/07/24 1040 96 %     Weight --      Height --      Head Circumference --      Peak Flow --      Pain Score 02/07/24 1037 7     Pain Loc --      Pain Education --      Exclude from Growth Chart --    No data found.  Updated Vital Signs BP 111/70 (BP Location: Left Arm)   Pulse 75   Temp 97.8 F (36.6 C) (Oral)   Resp 16   SpO2 96%   Visual Acuity Right Eye Distance:   Left Eye Distance:   Bilateral Distance:    Right Eye Near:   Left Eye Near:    Bilateral Near:     Physical Exam Vitals and nursing note reviewed.  Constitutional:      General: She is not in acute distress.    Appearance: Normal appearance. She is not ill-appearing, toxic-appearing or diaphoretic.  HENT:     Nose: Congestion (mildly enlarged turbinates) present. No rhinorrhea.     Mouth/Throat:     Mouth: Mucous membranes are moist.     Pharynx: Oropharynx is clear. No oropharyngeal exudate or posterior  oropharyngeal erythema.  Eyes:     General: No scleral icterus. Cardiovascular:     Rate and Rhythm: Normal rate and regular rhythm.     Heart sounds: Normal heart sounds.  Pulmonary:     Effort: Pulmonary effort is normal. No respiratory distress.     Breath sounds: Normal breath sounds. No wheezing or rhonchi.  Skin:    General: Skin is warm.  Neurological:     Mental Status: She is alert and oriented to person, place, and time.  Psychiatric:        Mood and Affect: Mood normal.        Behavior: Behavior normal.      UC Treatments / Results  Labs (all labs ordered are listed, but only abnormal results are displayed) Labs Reviewed  POCT INFLUENZA A/B    EKG   Radiology No results found.  Procedures Procedures (including critical care time)  Medications Ordered in UC Medications - No data to display  Initial Impression / Assessment and Plan / UC Course  I have reviewed the triage vital signs and the nursing notes.  Pertinent labs & imaging results that were available during my care of the patient were reviewed by me and considered in my medical decision making (see chart for details).    Final Clinical Impressions(s) / UC Diagnoses   Final diagnoses:  Viral URI   Discharge Instructions   None    ED Prescriptions   None    PDMP not reviewed this encounter.    [1]  Social History Tobacco Use   Smoking status: Former    Current packs/day: 0.00    Average packs/day: 1 pack/day for 30.0 years (30.0 ttl pk-yrs)    Types: Cigarettes  Start date: 03/19/1967    Quit date: 03/18/1997    Years since quitting: 26.9    Passive exposure: Past   Smokeless tobacco: Never  Vaping Use   Vaping status: Never Used  Substance Use Topics   Alcohol use: Not Currently   Drug use: Never     Andra Corean BROCKS, PA-C 02/07/24 1100  "

## 2024-02-07 NOTE — Discharge Instructions (Addendum)

## 2024-02-10 ENCOUNTER — Ambulatory Visit: Admitting: Family Medicine

## 2024-03-02 ENCOUNTER — Other Ambulatory Visit

## 2024-03-02 ENCOUNTER — Other Ambulatory Visit: Payer: Self-pay

## 2024-03-02 DIAGNOSIS — E785 Hyperlipidemia, unspecified: Secondary | ICD-10-CM

## 2024-03-02 DIAGNOSIS — E1122 Type 2 diabetes mellitus with diabetic chronic kidney disease: Secondary | ICD-10-CM

## 2024-03-02 DIAGNOSIS — E039 Hypothyroidism, unspecified: Secondary | ICD-10-CM

## 2024-03-03 ENCOUNTER — Ambulatory Visit: Payer: Self-pay

## 2024-03-03 LAB — LIPID PANEL
Chol/HDL Ratio: 3 ratio (ref 0.0–4.4)
Cholesterol, Total: 202 mg/dL — ABNORMAL HIGH (ref 100–199)
HDL: 68 mg/dL
LDL Chol Calc (NIH): 117 mg/dL — ABNORMAL HIGH (ref 0–99)
Triglycerides: 96 mg/dL (ref 0–149)
VLDL Cholesterol Cal: 17 mg/dL (ref 5–40)

## 2024-03-03 LAB — COMPREHENSIVE METABOLIC PANEL WITH GFR
ALT: 34 IU/L — ABNORMAL HIGH (ref 0–32)
AST: 29 IU/L (ref 0–40)
Albumin: 4.2 g/dL (ref 3.8–4.8)
Alkaline Phosphatase: 72 IU/L (ref 49–135)
BUN/Creatinine Ratio: 14 (ref 12–28)
BUN: 19 mg/dL (ref 8–27)
Bilirubin Total: 0.5 mg/dL (ref 0.0–1.2)
CO2: 22 mmol/L (ref 20–29)
Calcium: 10.7 mg/dL — ABNORMAL HIGH (ref 8.7–10.3)
Chloride: 110 mmol/L — ABNORMAL HIGH (ref 96–106)
Creatinine, Ser: 1.32 mg/dL — ABNORMAL HIGH (ref 0.57–1.00)
Globulin, Total: 2.3 g/dL (ref 1.5–4.5)
Glucose: 121 mg/dL — ABNORMAL HIGH (ref 70–99)
Potassium: 4 mmol/L (ref 3.5–5.2)
Sodium: 144 mmol/L (ref 134–144)
Total Protein: 6.5 g/dL (ref 6.0–8.5)
eGFR: 42 mL/min/1.73 — ABNORMAL LOW

## 2024-03-03 LAB — HEMOGLOBIN A1C
Est. average glucose Bld gHb Est-mCnc: 131 mg/dL
Hgb A1c MFr Bld: 6.2 % — ABNORMAL HIGH (ref 4.8–5.6)

## 2024-03-03 LAB — MICROALBUMIN / CREATININE URINE RATIO
Creatinine, Urine: 102.4 mg/dL
Microalb/Creat Ratio: 12 mg/g{creat} (ref 0–29)
Microalbumin, Urine: 12.7 ug/mL

## 2024-03-03 LAB — CBC WITH DIFFERENTIAL/PLATELET
Basophils Absolute: 0 x10E3/uL (ref 0.0–0.2)
Basos: 1 %
EOS (ABSOLUTE): 0.1 x10E3/uL (ref 0.0–0.4)
Eos: 3 %
Hematocrit: 39.1 % (ref 34.0–46.6)
Hemoglobin: 12.9 g/dL (ref 11.1–15.9)
Immature Grans (Abs): 0 x10E3/uL (ref 0.0–0.1)
Immature Granulocytes: 0 %
Lymphocytes Absolute: 0.9 x10E3/uL (ref 0.7–3.1)
Lymphs: 30 %
MCH: 30.4 pg (ref 26.6–33.0)
MCHC: 33 g/dL (ref 31.5–35.7)
MCV: 92 fL (ref 79–97)
Monocytes Absolute: 0.3 x10E3/uL (ref 0.1–0.9)
Monocytes: 10 %
Neutrophils Absolute: 1.7 x10E3/uL (ref 1.4–7.0)
Neutrophils: 56 %
Platelets: 125 x10E3/uL — ABNORMAL LOW (ref 150–450)
RBC: 4.25 x10E6/uL (ref 3.77–5.28)
RDW: 13.1 % (ref 11.7–15.4)
WBC: 3.1 x10E3/uL — ABNORMAL LOW (ref 3.4–10.8)

## 2024-03-03 LAB — TSH: TSH: 6.75 u[IU]/mL — ABNORMAL HIGH (ref 0.450–4.500)

## 2024-03-09 ENCOUNTER — Other Ambulatory Visit: Payer: Self-pay

## 2024-03-09 ENCOUNTER — Telehealth

## 2024-03-09 VITALS — Ht 62.0 in | Wt 123.0 lb

## 2024-03-09 DIAGNOSIS — E039 Hypothyroidism, unspecified: Secondary | ICD-10-CM

## 2024-03-09 DIAGNOSIS — E213 Hyperparathyroidism, unspecified: Secondary | ICD-10-CM

## 2024-03-09 DIAGNOSIS — E1122 Type 2 diabetes mellitus with diabetic chronic kidney disease: Secondary | ICD-10-CM | POA: Diagnosis not present

## 2024-03-09 DIAGNOSIS — F3112 Bipolar disorder, current episode manic without psychotic features, moderate: Secondary | ICD-10-CM

## 2024-03-09 DIAGNOSIS — K7460 Unspecified cirrhosis of liver: Secondary | ICD-10-CM

## 2024-03-09 DIAGNOSIS — N1832 Chronic kidney disease, stage 3b: Secondary | ICD-10-CM

## 2024-03-09 DIAGNOSIS — E785 Hyperlipidemia, unspecified: Secondary | ICD-10-CM

## 2024-03-09 MED ORDER — EMPAGLIFLOZIN 10 MG PO TABS: 10.0000 mg | ORAL_TABLET | Freq: Every day | ORAL | 3 refills | Status: AC

## 2024-03-09 NOTE — Assessment & Plan Note (Signed)
 TSH minimally elevated at 6.750. Discussed rechecking thyroid  panel is 4-6 weeks before deciding to make medication changes. Continue Levothyroxine  50 mcg daily for now.

## 2024-03-09 NOTE — Assessment & Plan Note (Signed)
 Creatinine 1.32, eGFR 42. Stable, at baseline. Discussed the benefits of SGLT2 inhibitors (Jardiance ) in the setting of DM2 and CKD. Patient reports nobody has ever discussed this with her but she is open to a trial of Jardiance . Risks and benefits of medication discussed. Will start Jardiance  10 mg once daily and recheck kidney function in 6 weeks.

## 2024-03-09 NOTE — Assessment & Plan Note (Signed)
 Liver enzymes within normal limits. With the exception of mildly elevated ALT (34). This has increased from her baseline.  Continue high-fiber, low-fat diet . Continue regular follow-up with gastroenterology: Dr. Kristie at [facility]. Will recheck CMP in 4-6 weeks

## 2024-03-09 NOTE — Assessment & Plan Note (Signed)
 Calcium level overall stable. -Reviewed prior records from [facility].  -Patient has CKD so question if hyperparathyroidism secondary to CKD. Will recheck Ca2+ and PTH with upcoming labs in 4-6 weeks to continue monitoring.

## 2024-03-09 NOTE — Assessment & Plan Note (Signed)
 A1c stable without medication at 6.1. Advised to continue checking FBG at home and controlling A1c with diet.  Discussed Jardiance  as a method of lowering A1c but also helping with her CKD. Patient agreeable to a trial. Start 10 mg Jardiance  once daily. Foot exam, UACR, eye exam all UTD.

## 2024-03-09 NOTE — Progress Notes (Signed)
 "  Virtual Visit via Video Note  I connected with [name] on 03/09/24 at  9:30 AM EST by a video enabled telemedicine application and verified that I am speaking with the correct person using two identifiers.  Patient Location: Home Provider Location: Home Office  I discussed the limitations, risks, security, and privacy concerns of performing an evaluation and management service by video and the availability of in person appointments. I also discussed with the patient that there may be a patient responsible charge related to this service. The patient expressed understanding and agreed to proceed.  Subjective: PCP: Gayle Saddie FALCON, PA-C  Chief Complaint  Patient presents with   Medical Management of Chronic Issues   HPI  [name] is a 79 y.o. female who presents via video visit due to inclement weather. Patient is here for routine follow up to discuss medications and lab results.   DM - Hx of DM. Currently controlling with diet and exercise. Not on any daily glucose lowering medications. Checks BG daily. No episodes of hyper or hypoglycemia.   Hypotension - Patient has been working to increase water  intake and has been checking BP daily at home. Reports her BP on average runs 110's/70's without medication and with very few outliers.   Hypothyroidism - Patient taking 50 mcg levothyroxine  daily away from food and other medications/supplements. Denies side effects.   Brain fog - Patient does report some increased brain fog over the last few months. Difficulty recalling certain words, etc. Questioning if she should try Prevagen. Reports that she does do crosswords and word searches daily to keep her brain active.   ROS: Per HPI Current Medications[1]  Observations/Objective: Today's Vitals   03/09/24 0909  Weight: 123 lb (55.8 kg)  Height: 5' 2 (1.575 m)  PainSc: 0-No pain   Physical Exam Constitutional:      Appearance: Normal appearance.  Neurological:      General: No focal deficit present.     Mental Status: She is alert and oriented to person, place, and time.  Psychiatric:        Mood and Affect: Mood normal.        Behavior: Behavior normal.        Thought Content: Thought content normal.    PE was limited due to media of this visit being via video  Recent Results (from the past 2160 hours)  POCT Influenza A/B     Status: Abnormal   Collection Time: 02/07/24 11:02 AM  Result Value Ref Range   Influenza A, POC Positive (A) Negative   Influenza B, POC Negative Negative  Urine Microalbumin w/creat. ratio     Status: None   Collection Time: 03/02/24  9:15 AM  Result Value Ref Range   Creatinine, Urine 102.4 Not Estab. mg/dL   Microalbumin, Urine 87.2 Not Estab. ug/mL   Microalb/Creat Ratio 12 0 - 29 mg/g creat    Comment:                        Normal:                0 -  29                        Moderately increased: 30 - 300                        Severely increased:       >  300   CBC with Differential/Platelet     Status: Abnormal   Collection Time: 03/02/24  9:15 AM  Result Value Ref Range   WBC 3.1 (L) 3.4 - 10.8 x10E3/uL   RBC 4.25 3.77 - 5.28 x10E6/uL   Hemoglobin 12.9 11.1 - 15.9 g/dL   Hematocrit 60.8 65.9 - 46.6 %   MCV 92 79 - 97 fL   MCH 30.4 26.6 - 33.0 pg   MCHC 33.0 31.5 - 35.7 g/dL   RDW 86.8 88.2 - 84.5 %   Platelets 125 (L) 150 - 450 x10E3/uL   Neutrophils 56 Not Estab. %   Lymphs 30 Not Estab. %   Monocytes 10 Not Estab. %   Eos 3 Not Estab. %   Basos 1 Not Estab. %   Neutrophils Absolute 1.7 1.4 - 7.0 x10E3/uL   Lymphocytes Absolute 0.9 0.7 - 3.1 x10E3/uL   Monocytes Absolute 0.3 0.1 - 0.9 x10E3/uL   EOS (ABSOLUTE) 0.1 0.0 - 0.4 x10E3/uL   Basophils Absolute 0.0 0.0 - 0.2 x10E3/uL   Immature Granulocytes 0 Not Estab. %   Immature Grans (Abs) 0.0 0.0 - 0.1 x10E3/uL  Comprehensive metabolic panel with GFR     Status: Abnormal   Collection Time: 03/02/24  9:15 AM  Result Value Ref Range   Glucose  121 (H) 70 - 99 mg/dL   BUN 19 8 - 27 mg/dL   Creatinine, Ser 8.67 (H) 0.57 - 1.00 mg/dL   eGFR 42 (L) >40 fO/fpw/8.26   BUN/Creatinine Ratio 14 12 - 28   Sodium 144 134 - 144 mmol/L   Potassium 4.0 3.5 - 5.2 mmol/L   Chloride 110 (H) 96 - 106 mmol/L   CO2 22 20 - 29 mmol/L   Calcium 10.7 (H) 8.7 - 10.3 mg/dL   Total Protein 6.5 6.0 - 8.5 g/dL   Albumin 4.2 3.8 - 4.8 g/dL   Globulin, Total 2.3 1.5 - 4.5 g/dL   Bilirubin Total 0.5 0.0 - 1.2 mg/dL   Alkaline Phosphatase 72 49 - 135 IU/L   AST 29 0 - 40 IU/L   ALT 34 (H) 0 - 32 IU/L  Lipid panel     Status: Abnormal   Collection Time: 03/02/24  9:15 AM  Result Value Ref Range   Cholesterol, Total 202 (H) 100 - 199 mg/dL   Triglycerides 96 0 - 149 mg/dL   HDL 68 >60 mg/dL   VLDL Cholesterol Cal 17 5 - 40 mg/dL   LDL Chol Calc (NIH) 882 (H) 0 - 99 mg/dL   Chol/HDL Ratio 3.0 0.0 - 4.4 ratio    Comment:                                   T. Chol/HDL Ratio                                             Men  Women                               1/2 Avg.Risk  3.4    3.3  Avg.Risk  5.0    4.4                                2X Avg.Risk  9.6    7.1                                3X Avg.Risk 23.4   11.0   Hemoglobin A1c     Status: Abnormal   Collection Time: 03/02/24  9:15 AM  Result Value Ref Range   Hgb A1c MFr Bld 6.2 (H) 4.8 - 5.6 %    Comment:          Prediabetes: 5.7 - 6.4          Diabetes: >6.4          Glycemic control for adults with diabetes: <7.0    Est. average glucose Bld gHb Est-mCnc 131 mg/dL  TSH     Status: Abnormal   Collection Time: 03/02/24  9:15 AM  Result Value Ref Range   TSH 6.750 (H) 0.450 - 4.500 uIU/mL     Assessment and Plan: Hypothyroidism, unspecified type Assessment & Plan: TSH minimally elevated at 6.750. Discussed rechecking thyroid  panel is 4-6 weeks before deciding to make medication changes. Continue Levothyroxine  50 mcg daily for now.    Cirrhosis,  nonalcoholic (HCC) Assessment & Plan: Liver enzymes within normal limits. With the exception of mildly elevated ALT (34). This has increased from her baseline.  Continue high-fiber, low-fat diet . Continue regular follow-up with gastroenterology: Dr. Kristie at [facility]. Will recheck CMP in 4-6 weeks    Bipolar 1 disorder (HCC)  Type 2 diabetes mellitus with diabetic chronic kidney disease, unspecified CKD stage, unspecified whether long term insulin use (HCC) Assessment & Plan: A1c stable without medication at 6.1. Advised to continue checking FBG at home and controlling A1c with diet.  Discussed Jardiance  as a method of lowering A1c but also helping with her CKD. Patient agreeable to a trial. Start 10 mg Jardiance  once daily. Foot exam, UACR, eye exam all UTD.    Stage 3b chronic kidney disease (HCC) Assessment & Plan: Creatinine 1.32, eGFR 42. Stable, at baseline. Discussed the benefits of SGLT2 inhibitors (Jardiance ) in the setting of DM2 and CKD. Patient reports nobody has ever discussed this with her but she is open to a trial of Jardiance . Risks and benefits of medication discussed. Will start Jardiance  10 mg once daily and recheck kidney function in 6 weeks.    Hyperparathyroidism Assessment & Plan: Calcium level overall stable. -Reviewed prior records from [facility].  -Patient has CKD so question if hyperparathyroidism secondary to CKD. Will recheck Ca2+ and PTH with upcoming labs in 4-6 weeks to continue monitoring.   Hyperlipidemia, unspecified hyperlipidemia type Assessment & Plan: Last lipid panel: LDL 117, HDL 68, Trig 96. Advised to continue controlling with diet. Could consider Coronary Calcium Score to  help determine need for initiation of cholesterol lowering therapy.    Bipolar 1 disorder, manic, moderate (HCC) Assessment & Plan: Stable. Recommend to continue to follow-up with psychiatry and continue current medication  regimen.     Follow Up Instructions: No follow-ups on file.   I discussed the assessment and treatment plan with the patient. The patient was provided an opportunity to ask questions, and all were answered. The patient agreed with the plan and demonstrated an understanding  of the instructions.   The patient was advised to call back or seek an in-person evaluation if the symptoms worsen or if the condition fails to improve as anticipated.  The above assessment and management plan was discussed with the patient. The patient verbalized understanding of and has agreed to the management plan.   Saddie JULIANNA Sacks, PA-C     [1]  Current Outpatient Medications:    acetaminophen  (TYLENOL ) 500 MG tablet, Take 500 mg by mouth every 6 (six) hours as needed. For pain, Disp: , Rfl:    FREESTYLE LITE test strip, Use to check blood sugars every morning fasting and 2 hours after largest meal, Disp: 200 each, Rfl: 3   Lancets (FREESTYLE) lancets, Use to check blood sugars every morning fasting and 2 hours after largest meal, Disp: 100 each, Rfl: 11   levothyroxine  (SYNTHROID ) 50 MCG tablet, Take 1 tablet (50 mcg total) by mouth daily., Disp: 90 tablet, Rfl: 3   SEROQUEL 50 MG tablet, Take 1 tablet by mouth at bedtime., Disp: , Rfl:   "

## 2024-03-09 NOTE — Assessment & Plan Note (Signed)
 Last lipid panel: LDL 117, HDL 68, Trig 96. Advised to continue controlling with diet. Could consider Coronary Calcium Score to  help determine need for initiation of cholesterol lowering therapy.

## 2024-03-09 NOTE — Assessment & Plan Note (Signed)
-  Stable. Recommend to continue to follow-up with psychiatry and continue current medication regimen.

## 2024-03-18 LAB — OPHTHALMOLOGY REPORT-SCANNED

## 2024-04-16 ENCOUNTER — Ambulatory Visit: Payer: Medicare Other

## 2024-04-21 ENCOUNTER — Other Ambulatory Visit

## 2024-04-27 ENCOUNTER — Other Ambulatory Visit (HOSPITAL_BASED_OUTPATIENT_CLINIC_OR_DEPARTMENT_OTHER)

## 2024-07-07 ENCOUNTER — Ambulatory Visit
